# Patient Record
Sex: Female | Born: 1985 | Race: Black or African American | Hispanic: No | Marital: Married | Smoking: Never smoker
Health system: Southern US, Community
[De-identification: ages and names within clinical notes are randomized; demographics above are authoritative.]

## PROBLEM LIST (undated history)

## (undated) ENCOUNTER — Inpatient Hospital Stay (HOSPITAL_COMMUNITY): Payer: Self-pay

## (undated) DIAGNOSIS — T8859XA Other complications of anesthesia, initial encounter: Secondary | ICD-10-CM

## (undated) DIAGNOSIS — R87619 Unspecified abnormal cytological findings in specimens from cervix uteri: Secondary | ICD-10-CM

## (undated) DIAGNOSIS — G43909 Migraine, unspecified, not intractable, without status migrainosus: Secondary | ICD-10-CM

## (undated) DIAGNOSIS — Z9889 Other specified postprocedural states: Secondary | ICD-10-CM

## (undated) DIAGNOSIS — D649 Anemia, unspecified: Secondary | ICD-10-CM

## (undated) DIAGNOSIS — K219 Gastro-esophageal reflux disease without esophagitis: Secondary | ICD-10-CM

## (undated) DIAGNOSIS — T7840XA Allergy, unspecified, initial encounter: Secondary | ICD-10-CM

## (undated) DIAGNOSIS — R32 Unspecified urinary incontinence: Secondary | ICD-10-CM

## (undated) DIAGNOSIS — IMO0002 Reserved for concepts with insufficient information to code with codable children: Secondary | ICD-10-CM

## (undated) DIAGNOSIS — Z8619 Personal history of other infectious and parasitic diseases: Secondary | ICD-10-CM

## (undated) DIAGNOSIS — B999 Unspecified infectious disease: Secondary | ICD-10-CM

## (undated) DIAGNOSIS — A6000 Herpesviral infection of urogenital system, unspecified: Secondary | ICD-10-CM

## (undated) DIAGNOSIS — N83209 Unspecified ovarian cyst, unspecified side: Secondary | ICD-10-CM

## (undated) DIAGNOSIS — T4145XA Adverse effect of unspecified anesthetic, initial encounter: Secondary | ICD-10-CM

## (undated) DIAGNOSIS — I10 Essential (primary) hypertension: Secondary | ICD-10-CM

## (undated) DIAGNOSIS — R112 Nausea with vomiting, unspecified: Secondary | ICD-10-CM

## (undated) HISTORY — DX: Other complications of anesthesia, initial encounter: T88.59XA

## (undated) HISTORY — DX: Allergy, unspecified, initial encounter: T78.40XA

## (undated) HISTORY — DX: Essential (primary) hypertension: I10

## (undated) HISTORY — DX: Unspecified infectious disease: B99.9

## (undated) HISTORY — PX: COLPOSCOPY: SHX161

## (undated) HISTORY — DX: Adverse effect of unspecified anesthetic, initial encounter: T41.45XA

## (undated) HISTORY — PX: CERVIX LESION DESTRUCTION: SHX591

## (undated) HISTORY — DX: Personal history of other infectious and parasitic diseases: Z86.19

## (undated) HISTORY — DX: Migraine, unspecified, not intractable, without status migrainosus: G43.909

## (undated) HISTORY — PX: WISDOM TOOTH EXTRACTION: SHX21

## (undated) HISTORY — DX: Gastro-esophageal reflux disease without esophagitis: K21.9

## (undated) HISTORY — DX: Herpesviral infection of urogenital system, unspecified: A60.00

## (undated) HISTORY — DX: Unspecified urinary incontinence: R32

---
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
## 2004-01-08 ENCOUNTER — Encounter: Admission: RE | Admit: 2004-01-08 | Discharge: 2004-01-08 | Payer: Self-pay | Admitting: *Deleted

## 2004-01-23 ENCOUNTER — Ambulatory Visit (HOSPITAL_COMMUNITY): Admission: RE | Admit: 2004-01-23 | Discharge: 2004-01-23 | Payer: Self-pay | Admitting: *Deleted

## 2004-01-31 ENCOUNTER — Inpatient Hospital Stay (HOSPITAL_COMMUNITY): Admission: AD | Admit: 2004-01-31 | Discharge: 2004-01-31 | Payer: Self-pay | Admitting: Family Medicine

## 2004-02-04 ENCOUNTER — Inpatient Hospital Stay (HOSPITAL_COMMUNITY): Admission: AD | Admit: 2004-02-04 | Discharge: 2004-02-08 | Payer: Self-pay | Admitting: Obstetrics & Gynecology

## 2004-02-04 ENCOUNTER — Ambulatory Visit: Payer: Self-pay | Admitting: Obstetrics & Gynecology

## 2004-02-05 ENCOUNTER — Encounter (INDEPENDENT_AMBULATORY_CARE_PROVIDER_SITE_OTHER): Payer: Self-pay | Admitting: *Deleted

## 2004-08-11 IMAGING — US US OB COMP +14 WK
1 series · 13 of 28 positions shown · non-contrast
Comparison: none

CLINICAL DATA: Anatomic exam.  No current problems.

[Series 1: unknown · 0.28mm/px · 13 of 101 slices shown]
[im 4/101]
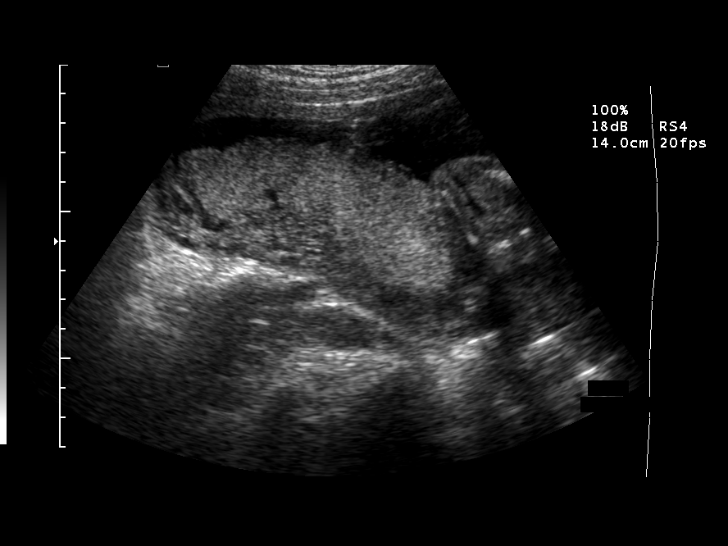
[im 12/101]
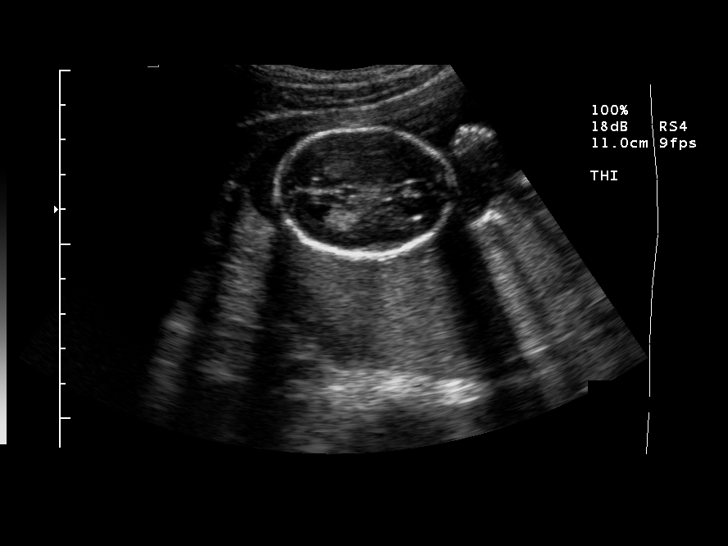
[im 19/101]
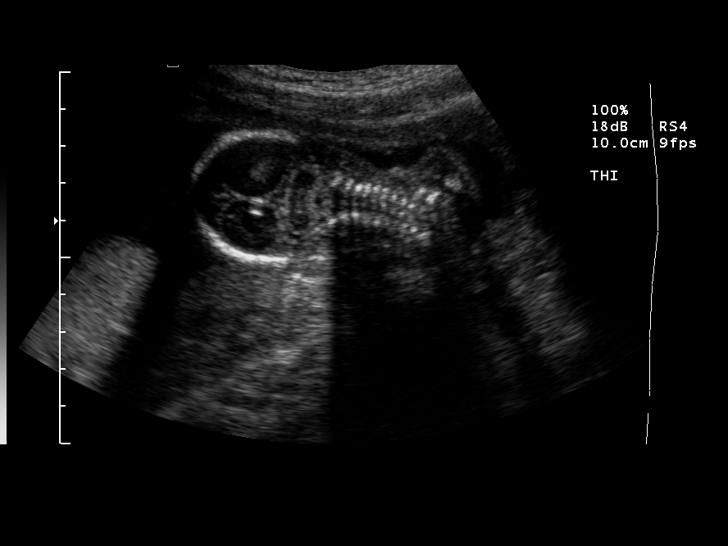
[im 26/101]
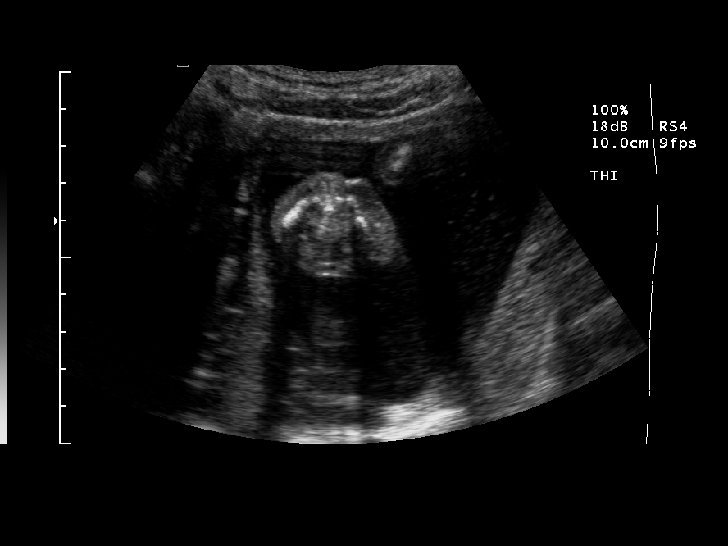
[im 34/101]
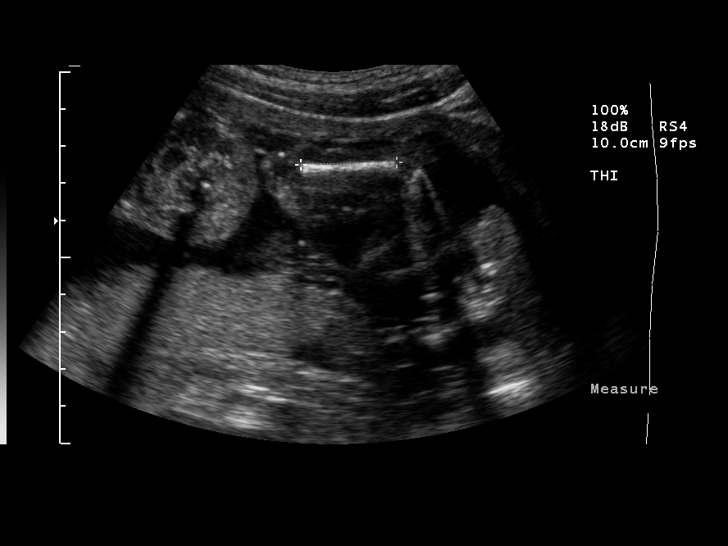
[im 41/101]
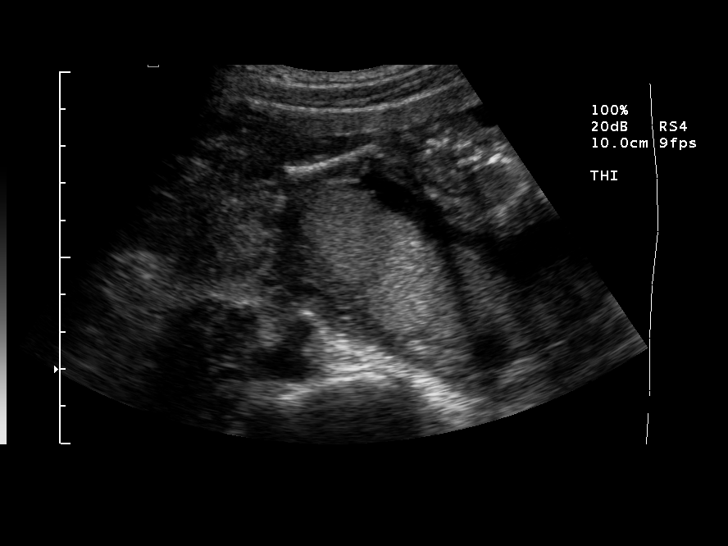
[im 52/101]
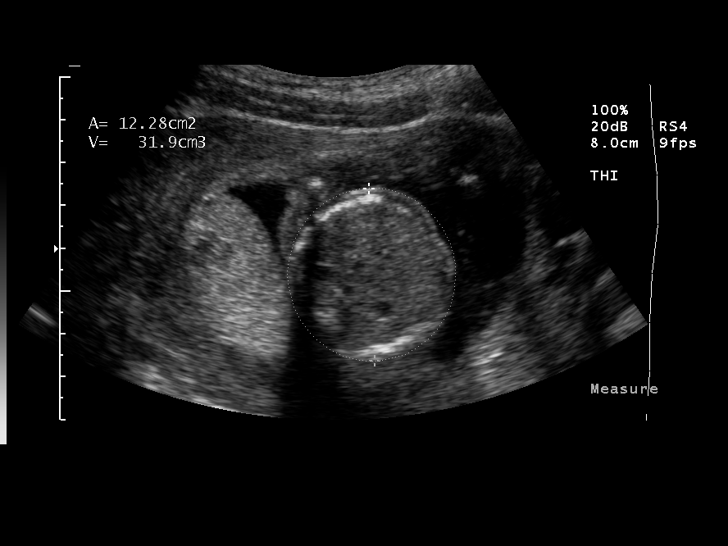
[im 60/101]
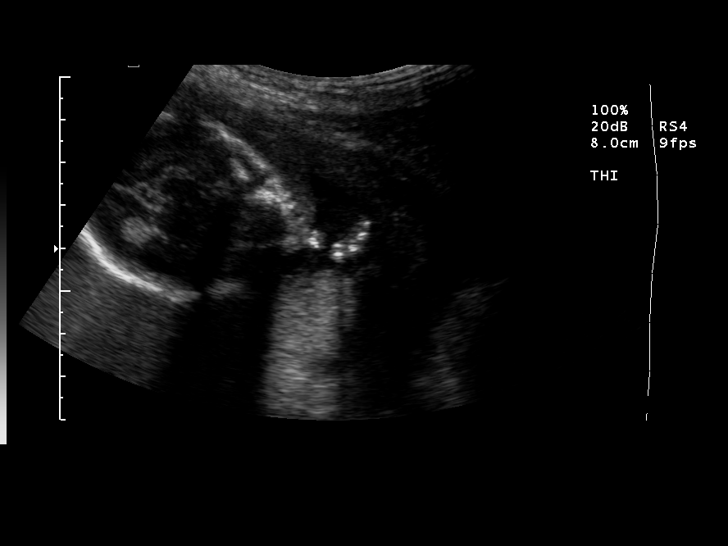
[im 67/101]
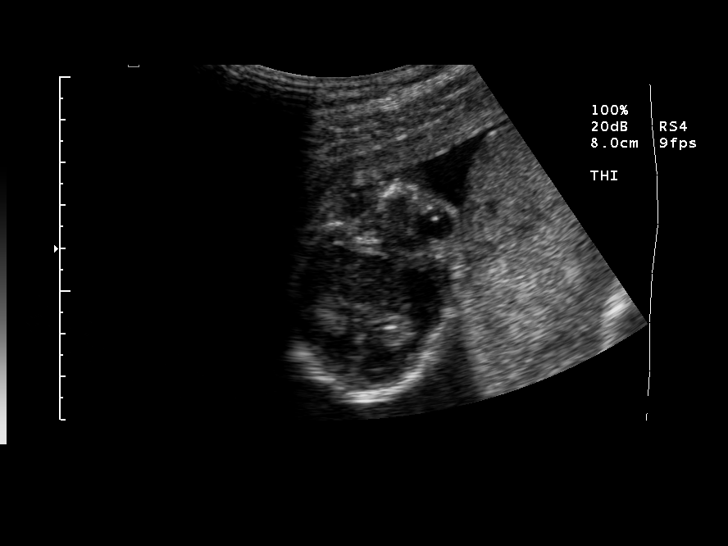
[im 75/101]
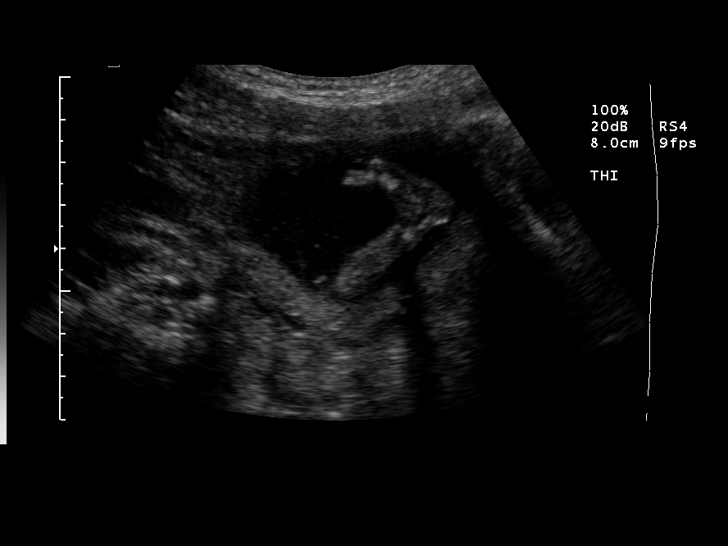
[im 82/101]
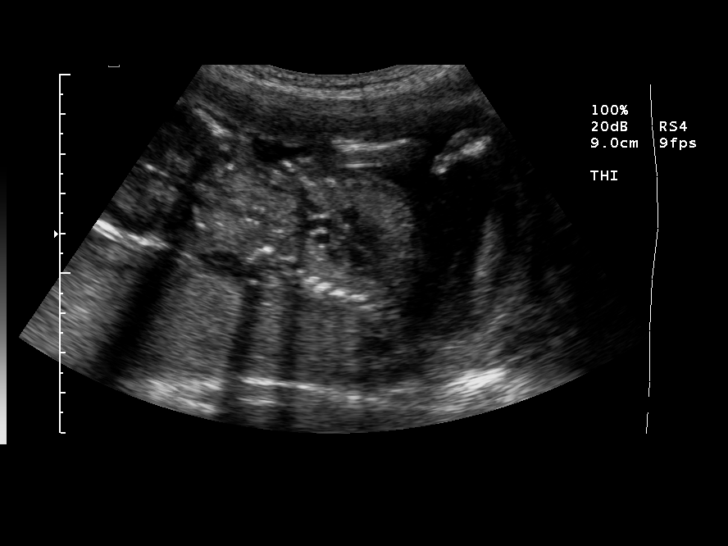
[im 89/101]
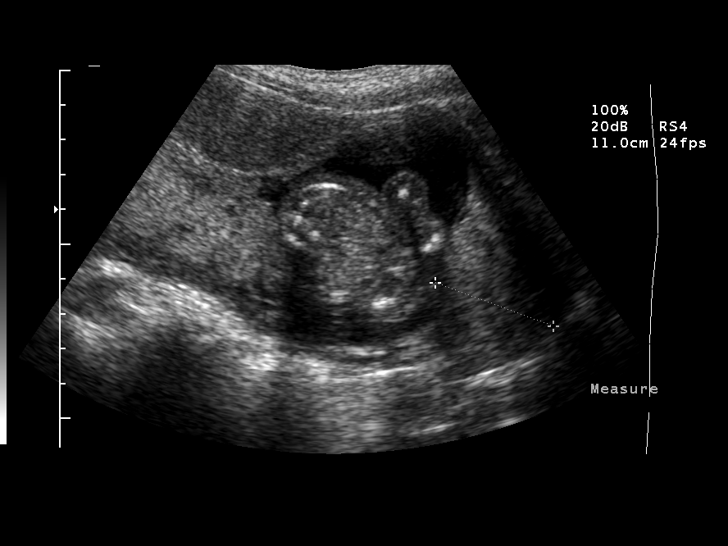
[im 97/101]
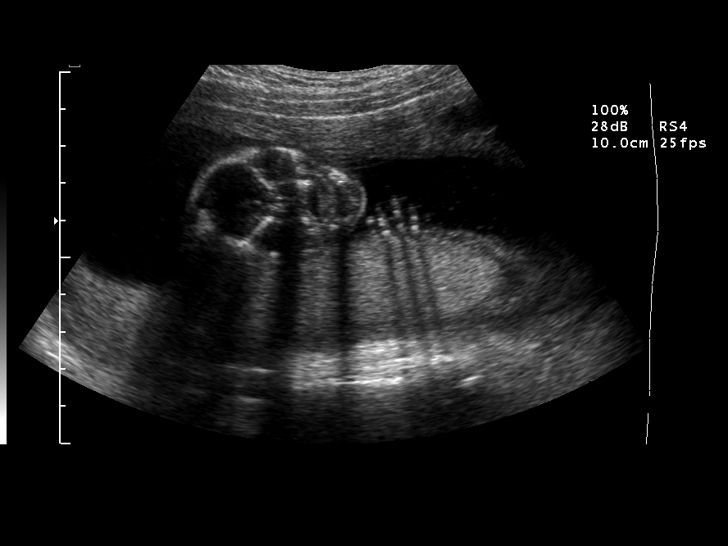

[13 of 28 positions shown; findings below may reference images not displayed]

OBSTETRICAL ULTRASOUND
Number of Fetuses:  1
Heart Rate:  162
Movement:  Yes
Breathing:    Yes  
Presentation:  Breech
Placental Location:  Posterior
Grade:  I
Previa:  No
Amniotic Fluid (Subjective):  Normal
Amniotic Fluid (Objective):   6.5 cm Vertical pocket 

FETAL BIOMETRY
BPD:  3.8 cm   17 w 4 d
HC:  14.6 cm   17 w 6 d
AC:  12.6 cm   18 w 1 d
FL:  2.8 cm   18 w 4 d

MEAN GA:  18 w 1 d

FETAL ANATOMY
Lateral Ventricles:    Visualized 
Thalami/CSP:      Visualized 
Posterior Fossa:  Visualized 
Nuchal Region:    Visualized 
Spine:      Visualized 
4 Chamber Heart on Left:      Visualized 
Stomach on Left:      Visualized 
3 Vessel Cord:    Visualized 
Cord Insertion site:    Visualized 
Kidneys:  Visualized 
Bladder:  Visualized 
Extremities:      Visualized 

ADDITIONAL ANATOMY VISUALIZED:  LVOT, RVOT, upper lip, orbits, profile, diaphragm, heel, 5th digit, ductal arch, aortic arch, and male genitalia

MATERNAL FINDINGS
Cervix:   3.6 cm Transabdominally
IMPRESSION: Single intrauterine pregnancy demonstrating an estimated gestational age by ultrasound of 18 weeks and 1 day.  Correlation with assigned gestational age by initial ultrasound of 18 weeks suggests appropriate growth.
No focal fetal or placental abnormalities are noted with a good anatomic exam possible.  

</u12:p>

## 2004-08-19 ENCOUNTER — Emergency Department (HOSPITAL_COMMUNITY): Admission: EM | Admit: 2004-08-19 | Discharge: 2004-08-19 | Payer: Self-pay | Admitting: Family Medicine

## 2005-05-11 DIAGNOSIS — B999 Unspecified infectious disease: Secondary | ICD-10-CM

## 2005-05-11 HISTORY — DX: Unspecified infectious disease: B99.9

## 2005-09-23 ENCOUNTER — Emergency Department (HOSPITAL_COMMUNITY): Admission: EM | Admit: 2005-09-23 | Discharge: 2005-09-23 | Payer: Self-pay | Admitting: Family Medicine

## 2006-07-05 ENCOUNTER — Emergency Department (HOSPITAL_COMMUNITY): Admission: EM | Admit: 2006-07-05 | Discharge: 2006-07-05 | Payer: Self-pay | Admitting: Emergency Medicine

## 2007-03-17 ENCOUNTER — Inpatient Hospital Stay (HOSPITAL_COMMUNITY): Admission: AD | Admit: 2007-03-17 | Discharge: 2007-03-17 | Payer: Self-pay | Admitting: Gynecology

## 2007-04-21 ENCOUNTER — Encounter: Payer: Self-pay | Admitting: Obstetrics & Gynecology

## 2007-04-21 ENCOUNTER — Ambulatory Visit: Payer: Self-pay | Admitting: Obstetrics & Gynecology

## 2007-04-25 ENCOUNTER — Ambulatory Visit (HOSPITAL_COMMUNITY): Admission: RE | Admit: 2007-04-25 | Discharge: 2007-04-25 | Payer: Self-pay | Admitting: Obstetrics & Gynecology

## 2007-05-19 ENCOUNTER — Ambulatory Visit: Payer: Self-pay | Admitting: Gynecology

## 2007-06-08 ENCOUNTER — Ambulatory Visit: Payer: Self-pay | Admitting: Obstetrics & Gynecology

## 2008-01-10 ENCOUNTER — Inpatient Hospital Stay (HOSPITAL_COMMUNITY): Admission: AD | Admit: 2008-01-10 | Discharge: 2008-01-10 | Payer: Self-pay | Admitting: Obstetrics and Gynecology

## 2008-03-05 ENCOUNTER — Inpatient Hospital Stay (HOSPITAL_COMMUNITY): Admission: AD | Admit: 2008-03-05 | Discharge: 2008-03-05 | Payer: Self-pay | Admitting: Obstetrics and Gynecology

## 2008-05-24 ENCOUNTER — Encounter (INDEPENDENT_AMBULATORY_CARE_PROVIDER_SITE_OTHER): Payer: Self-pay | Admitting: Obstetrics and Gynecology

## 2008-05-24 ENCOUNTER — Ambulatory Visit: Payer: Self-pay | Admitting: Surgery

## 2008-05-24 ENCOUNTER — Ambulatory Visit: Admission: RE | Admit: 2008-05-24 | Discharge: 2008-05-24 | Payer: Self-pay | Admitting: Obstetrics and Gynecology

## 2008-06-16 ENCOUNTER — Inpatient Hospital Stay (HOSPITAL_COMMUNITY): Admission: AD | Admit: 2008-06-16 | Discharge: 2008-06-16 | Payer: Self-pay | Admitting: Obstetrics and Gynecology

## 2008-07-10 ENCOUNTER — Inpatient Hospital Stay (HOSPITAL_COMMUNITY): Admission: AD | Admit: 2008-07-10 | Discharge: 2008-07-10 | Payer: Self-pay | Admitting: Obstetrics and Gynecology

## 2008-07-13 ENCOUNTER — Inpatient Hospital Stay (HOSPITAL_COMMUNITY): Admission: AD | Admit: 2008-07-13 | Discharge: 2008-07-13 | Payer: Self-pay | Admitting: Obstetrics and Gynecology

## 2008-08-07 ENCOUNTER — Inpatient Hospital Stay (HOSPITAL_COMMUNITY): Admission: RE | Admit: 2008-08-07 | Discharge: 2008-08-10 | Payer: Self-pay | Admitting: Obstetrics and Gynecology

## 2008-08-07 ENCOUNTER — Encounter (INDEPENDENT_AMBULATORY_CARE_PROVIDER_SITE_OTHER): Payer: Self-pay | Admitting: Obstetrics and Gynecology

## 2008-08-14 ENCOUNTER — Ambulatory Visit: Admission: RE | Admit: 2008-08-14 | Discharge: 2008-08-14 | Payer: Self-pay | Admitting: Obstetrics and Gynecology

## 2010-06-01 ENCOUNTER — Encounter: Payer: Self-pay | Admitting: *Deleted

## 2010-08-21 LAB — CBC
HCT: 27.4 % — ABNORMAL LOW (ref 36.0–46.0)
HCT: 32.8 % — ABNORMAL LOW (ref 36.0–46.0)
Hemoglobin: 11.1 g/dL — ABNORMAL LOW (ref 12.0–15.0)
Hemoglobin: 9.3 g/dL — ABNORMAL LOW (ref 12.0–15.0)
MCHC: 33.7 g/dL (ref 30.0–36.0)
MCHC: 34 g/dL (ref 30.0–36.0)
MCHC: 33.6 g/dL (ref 30.0–36.0)
MCV: 91.3 fL (ref 78.0–100.0)
Platelets: 211 10*3/uL (ref 150–400)
RBC: 3.01 MIL/uL — ABNORMAL LOW (ref 3.87–5.11)
RBC: 3.61 MIL/uL — ABNORMAL LOW (ref 3.87–5.11)
RDW: 14.7 % (ref 11.5–15.5)
RDW: 13.6 % (ref 11.5–15.5)
WBC: 6.8 10*3/uL (ref 4.0–10.5)
WBC: 9.3 10*3/uL (ref 4.0–10.5)
WBC: 10.2 10*3/uL (ref 4.0–10.5)

## 2010-08-21 LAB — STREP B DNA PROBE

## 2010-08-21 LAB — URINALYSIS, ROUTINE W REFLEX MICROSCOPIC
Bilirubin Urine: NEGATIVE
Glucose, UA: NEGATIVE mg/dL
Ketones, ur: NEGATIVE mg/dL
Nitrite: NEGATIVE
Protein, ur: NEGATIVE mg/dL
Specific Gravity, Urine: <1.005 — ABNORMAL LOW (ref 1.005–1.030)

## 2010-08-21 LAB — DIFFERENTIAL
Basophils Relative: 0 % (ref 0–1)
Eosinophils Absolute: 0.1 10*3/uL (ref 0.0–0.7)
Eosinophils Relative: 1 % (ref 0–5)
Lymphocytes Relative: 28 % (ref 12–46)
Monocytes Absolute: 0.9 10*3/uL (ref 0.1–1.0)
Neutro Abs: 6.4 10*3/uL (ref 1.7–7.7)

## 2010-08-21 LAB — WET PREP, GENITAL: Trich, Wet Prep: NONE SEEN

## 2010-08-21 LAB — GC/CHLAMYDIA PROBE AMP, GENITAL: GC Probe Amp, Genital: NEGATIVE

## 2010-08-26 LAB — COMPREHENSIVE METABOLIC PANEL
AST: 19 U/L (ref 0–37)
CO2: 25 mEq/L (ref 19–32)
Chloride: 108 mEq/L (ref 96–112)
Creatinine, Ser: 0.49 mg/dL (ref 0.4–1.2)
GFR calc Af Amer: >60 mL/min (ref >60)
GFR calc non Af Amer: >60 mL/min (ref >60)
Glucose, Bld: 81 mg/dL (ref 70–99)

## 2010-08-26 LAB — DIFFERENTIAL
Basophils Absolute: 0 10*3/uL (ref 0.0–0.1)
Basophils Relative: 0 % (ref 0–1)
Eosinophils Relative: 1 % (ref 0–5)
Lymphocytes Relative: 26 % (ref 12–46)
Lymphs Abs: 2.8 10*3/uL (ref 0.7–4.0)
Monocytes Absolute: 1 10*3/uL (ref 0.1–1.0)
Neutro Abs: 6.6 10*3/uL (ref 1.7–7.7)

## 2010-08-26 LAB — CBC
MCHC: 34.4 g/dL (ref 30.0–36.0)
MCV: 91.7 fL (ref 78.0–100.0)
Platelets: 237 10*3/uL (ref 150–400)
RDW: 13.2 % (ref 11.5–15.5)

## 2010-08-26 LAB — URINALYSIS, ROUTINE W REFLEX MICROSCOPIC
Bilirubin Urine: NEGATIVE
Glucose, UA: NEGATIVE mg/dL
Ketones, ur: NEGATIVE mg/dL
Nitrite: NEGATIVE
Specific Gravity, Urine: <1.005 — ABNORMAL LOW (ref 1.005–1.030)
Urobilinogen, UA: 0.2 mg/dL (ref 0.0–1.0)
pH: 6.5 (ref 5.0–8.0)

## 2010-09-23 NOTE — H&P (Signed)
NAMEMELANE, [name]              ACCOUNT NO.:  [id number]   MEDICAL RECORD NO.:  [id number]           PATIENT TYPE:   LOCATION:                                 FACILITY:   PHYSICIAN:  Vicki L. Latham, C.N.M.DATE OF BIRTH:  Jan 24, 1986   DATE OF ADMISSION:  08/07/2008  DATE OF DISCHARGE:                              HISTORY & PHYSICAL   [name] is a 25 year old, gravida 2, para 0-1-0-1, at 39 weeks, who  presents for scheduled repeat cesarean section.  The patient's pregnancy  has been remarkable for:   1. Previous preterm labor and preterm delivery at 35 weeks, due to      breech presentation.  2. Previous cesarean section with desire for repeat.  3. History of hyperemesis.  4. Anemia.  5. Positive group B strep.  6. History of abnormal Pap.  7. Amoxicillin allergy.   PRENATAL LABS:  Blood type is B+, Rh antibody negative, VDRL  nonreactive, rubella titer positive, hepatitis B surface antigen  negative, HIV is nonreactive.  Sickle cell test was negative in previous  pregnancy.  Pap was normal in January 2009.  GC and Chlamydia cultures  were negative in the first trimester.  Cystic fibrosis testing was  negative in previous pregnancy.  The patient had a first trimester  screen that was normal.  She had an AFP that was normal.  Glucola was  normal at 119.  Hemoglobin at 28 weeks was 10.  Group B strep culture  was positive at 36 weeks.  GC and Chlamydia cultures were negative.   HISTORY OF PRESENT PREGNANCY:  The patient entered care at approximately  10 weeks.  She had some nausea and vomiting in the early pregnancy and  was seen at maternity admissions for IV fluids and was on Phenergan.  She planned a repeat C-section from the beginning of her pregnancy.  She  also declined 17P.  She was placed on iron supplement after her first  visit, secondary to hemoglobin 11.7.  She had a normal first-trimester  screen.  AFP was normal.  She had another visit at [facility] at  approximately  14-15 weeks and was given Zofran at that time.  She had an ultrasound at  18 weeks, showing normal growth and development and normal cervical  length.  Glucola was given at 26 weeks and was normal and she continued  on Zofran use sporadically through most of her pregnancy.  At 28 weeks,  she had some vaginal discharge and was treated with Terazol and  Lotrisone.  She had some pain behind the right knee.  Dopplers were done  that were normal.  Fetal fibronectin was done at 30 weeks secondary to  cramping.  This was negative.  GC and Chlamydia cultures were done at 32  weeks, secondary to cramping.  These were negative.  She had positive  group B strep culture done at 36 weeks.  Her cervix at 37 weeks was  fingertip and 50%.  She was consented for repeat cesarean section and  this was scheduled for August 07, 2008.   OBSTETRICAL HISTORY:  In September  2005 she had a primary low transverse  cesarean section for a female infant, weight 5 pounds 5 ounces at 35  weeks.  She was in labor 6-7 hours.  This was done secondary to labor  and fetus being breech.  Through that pregnancy she did take  iron.  She  also had first-trimester spotting.  She also needed IV fluids  sporadically up until approximately 20-22 weeks.  She did have preterm  dilatation in that previous pregnancy and dilated 2.5 cm in the late  second and early third trimester.   MEDICAL HISTORY:  She is a previous condom birth control pill and  Implanon and Ortho-Evra patch user.  She had an abnormal Pap in 2002 and  had a colposcopy.  She had gonorrhea and Chlamydia diagnosed at age 42  and was treated.  She has some occasional yeast and bacterial  infections.  She reports usual childhood illnesses, does have a history  of anemia with her pregnancies.  She had a UTI as a baby, but none  since.   SURGICAL HISTORY:  Includes the previously-noted C-section in 2005.  She  has also required treatment of nausea and  vomiting after surgery.   ALLERGIES:  She is allergic to AMOXICILLIN, which causes hives.   FAMILY HISTORY:  Maternal grandmother had congestive heart failure.  Her  maternal grandmother had hypertension.  Her mother has varicosities.  Her father and maternal first cousin have type 2 diabetes.  Her mother  is hyperthyroid.  Maternal grandmother was on dialysis.  Her mother,  maternal first cousin and maternal uncle have sarcoidosis.  Maternal  grandfather had prostate cancer.  Genetic history is unremarkable.   SOCIAL HISTORY:  The patient is single.  Father of baby is involved and  supportive.  His name is England Greb.  The patient is Patent examiner.  She denies a religious affiliation.  She is high-school-  educated.  She is employed at [employer].  Her partner is also high-  school-educated.  He is a Investment banker, operational.  She has been followed by the [facility]  She denies any alcohol, drug or tobacco  use during this pregnancy.   PHYSICAL EXAM:  VITAL SIGNS: Stable.  The patient is afebrile.  HEENT: Within normal limits.  LUNGS:  Breath sounds are clear.  HEART:  Regular rate and rhythm without murmur.  BREASTS:  Soft and nontender.  ABDOMEN:  Fundal height is appropriate for gestational age at  approximately 39 weeks.  Estimated fetal weight 7 to 7-1/2 pounds.  Uterine contractions are very occasional per patient report.  Cervical  exam on last visit on March 25 was long and closed by Dr. Pennie Rushing.  Fetal heart rate is in the 150s by Doppler.  The abdomen shows a well-  healed low transverse cesarean section incision from 2005.  EXTREMITIES:  Deep tendon reflexes are 2+ without clonus.  There is  trace edema noted.   IMPRESSION:  1. Intrauterine pregnancy at 39 weeks.  2. Previous cesarean section with desire for repeat.  3. Positive group B strep.  4. History of hyperemesis this pregnancy with patient continuing on      Zofran sporadically during  her pregnancy.   PLAN:  1. Admit to [facility] per consult with Dr. Dierdre Forth as attending physician.  2. Routine physician preoperative orders.  3. Antibiotic treatment will be appropriately ordered by Dr. Pennie Rushing.  Renaldo Reel Emilee Hero, C.N.M.     VLL/MEDQ  D:  08/03/2008  T:  08/03/2008  Job:  [id number]

## 2010-09-23 NOTE — H&P (Signed)
NAMEAVALEIGH, DECUIR NO.:  [id number]   MEDICAL RECORD NO.:  [id number]          PATIENT TYPE:  WOC   LOCATION:  WOC                          FACILITY:  [facility]   PHYSICIAN:  Allie Bossier, MD        DATE OF BIRTH:  10-Dec-1985   DATE OF ADMISSION:  04/21/2007  DATE OF DISCHARGE:                              HISTORY & PHYSICAL   OFFICE VISIT:  Alencia is a 25 year old single black gravida 1, para 47  with a 92-year-old son who comes in for her annual exam.  She also needs  a followup on an ultrasound finding of an ovarian cyst on March 15, 2007.  Today she has no complaints.  She does not want birth control  other than the condoms that she currently uses.   PAST MEDICAL HISTORY:  None.   PAST SURGICAL HISTORY:  Cesarean section.   REVIEW OF SYSTEMS:  Noncontributory.   ALLERGIES:  No latex allergies.  Medication allergies are AMOXICILLIN.   SOCIAL HISTORY:  Positive only for social alcohol.   FAMILY HISTORY:  Negative for breast, gyn or colon malignancies.   MEDICATIONS:  Multivitamins daily.   PHYSICAL EXAMINATION:  VITAL SIGNS:  Weight 140 pounds, height 5 feet 3  inches, blood pressure 115/69 and pulse 70.  HEENT:  Normal.  BREASTS:  Exam normal bilaterally.  HEART:  Regular rate and rhythm.  LUNGS:  Clear to auscultation bilaterally.  ABDOMEN:  Benign.  External genitalia no lesions.  Vagina normal  discharge.  Cervix nulliparous.  Bimanual exam of normal size and shape,  anteverted mobile uterus and nonenlarged adnexa.   ASSESSMENT AND PLAN:  1. Annual exam.  I have checked a Pap smear and sent cervical cultures      with that.  2. We have discussed self-breast and self-vulvar exams monthly.  3. Will follow up on her left adnexal mass with a repeat ultrasound.      Allie Bossier, MD  Electronically Signed    MCD/MEDQ  D:  04/21/2007  T:  04/22/2007  Job:  [id number]

## 2010-09-23 NOTE — Discharge Summary (Signed)
NAMECHRISTIANA, [name]              ACCOUNT NO.:  [id number]   MEDICAL RECORD NO.:  [id number]          PATIENT TYPE:  INP   LOCATION:  9118                          [facility]ILITY[facility]   PHYSICIAN:  Osborn Coho, M.D.   DATE OF BIRTH:  1985/08/30   DATE OF ADMISSION:  08/07/2008  DATE OF DISCHARGE:  08/10/2008                               DISCHARGE SUMMARY   [name] is a 25 year old gravida 2, para 1-1-0-2, who was admitted  for a repeat cesarean for the delivery of her second son Angela [name],  who was delivered at 49 weeks' gestation, and weighed 8 pounds 4 ounces  with Apgars of 8 at 1-minute and 9 at 5 minutes.   ADMISSION DIAGNOSES:  1. Term singleton Intrauterine Pregnancy.  2. Previous cesarean at 35 weeks for breech.  3. Positive group B streptococcus.  4. history of abnormal Pap smear/colposcopy.  5. History of gonorrhea and chlamydia.  6. AMOXICILLIN allergy.   DISCHARGE DIAGNOSES:  1. Stable 3 days status post term repeat cesarean.  2. Positive group B streptococcus.  3. History of abnormal Pap smear/colposcopy.  4. Remote history of chlamydia and gonorrhea.  5. Allergic to AMOXICILLIN.  6. Anemia.   PERTINENT LABORATORY DATA:  B positive, rubella immune, and positive  GBS.  WBC 9.3(6.8), HGB 9.3 (11.1), HCT 27.4 (32.8), PLT 211 (256).   COURSE OF STAY:  [name] was admitted on the morning of August 07, 2008 for planned repeat cesarean section, which she was conducted  without complications under spinal anesthesia.  She had an excellent  course of recovery with good mobility and pain control and progressed  well under the cesarean postoperative clinical pathway care.  On August 10, 2008, she was discharged home in stable condition.  Physical exam and  vital signs on the day of discharge; vital signs were 98.1, 71, 18,  112/67.  [name] reported good pain relief with her medication.  She  had positive flatus and was seen to be up and about the room moving  very  well with good posture.  She was voiding without difficulty and needed  to be discharged home.  She was alert and oriented x3 with lungs clear  to auscultation.  Heart, regular rate and rhythm without murmur.  No  edema of extremities was found.  DTRs +1/+1.  Negative Homans x2.  Breasts were soft.  Nipples were sore, but intact.  She was wearing  shelf in her nursing.  Her abdomen was soft without distention.  Her  incision and Steri-Strips were clean, dry, and intact without any  gaping.  She had very scant lochia from her vagina and was working with  the Advertising copywriter on breast feeding and pumping issues.  She was  provided with the [facility] for woman postpartum instruction  booklet, which was reviewed with her specifically danger signs of the  postpartum and postoperative.   DISCHARGE MEDICATIONS:  1. Micronor.  2. Motrin 600 mg.  3. Percocet.  4. She is to continue Flintstones.  5. Iron supplementation was discussed with [name] and  she declined      iron supplementation and she is strongly opposed to taking due to      side effects.  However, she had adequate energy and was      experiencing no dizziness or orthostatic symptoms with her      hemoglobin of 9.3.  [name] was deemed to have received full      benefit for the hospitalization as delivery of her son, Angela [name].      Eulogio Bear, CNM      Osborn Coho, M.D.  Electronically Signed    JM/MEDQ  D:  08/10/2008  T:  08/11/2008  Job:  [id number]

## 2010-09-23 NOTE — Op Note (Signed)
Angela [name], Angela [name]              ACCOUNT NO.:  [id number]   MEDICAL RECORD NO.:  [id number]          PATIENT TYPE:  MAT   LOCATION:  MATC                          [facility]  [facility]   PHYSICIAN:  Osborn Coho, M.D.   DATE OF BIRTH:  Sep 19, 1985   DATE OF PROCEDURE:  08/07/2008  DATE OF DISCHARGE:  07/13/2008                               OPERATIVE REPORT   PREOPERATIVE DIAGNOSES:  1. 39 weeks.  2. Repeat cesarean section.   POSTOPERATIVE DIAGNOSES:  1. 39 weeks.  2. Repeat cesarean section.  3. Thick meconium.   PROCEDURE:  Repeat cesarean section.   ATTENDING DOCTOR:  Osborn Coho, MD   ASSISTANT:  Renaldo Reel. Emilee Hero, CNM   ANESTHESIA:  Spinal.   FINDINGS:  Normal appearing bilateral ovaries and fallopian tubes, live  female infant with Apgars of 8 at one minute and 9 at five minutes [employer] weighing 8 pounds 4 ounces.  Placenta sent to pathology.   FLUIDS:  1700 mL.   URINE OUTPUT:  200 mL.   EBL:  500 mL.   COMPLICATIONS:  None.   PROCEDURE:  The patient was taken to the operating room after the risks,  benefits, and alternatives discussed with the patient.  The patient  verbalized understanding and consent signed and witnessed.  The patient  was given a spinal per anesthesia and prepped and draped in the normal  sterile fashion in the supine position.  A Pfannenstiel skin incision  was made at the site of the prior scar and carried down to the  underlying layer of fascia with the scalpel.  The fascia was excised  bilaterally in the midline and extended bilaterally with the Mayo  scissors.  Kocher clamps were placed on the inferior aspect of the  fascial incision and the rectus muscle excised from the fascia.  The  same was done on the superior aspect of the fascial incision.  There was  dense scar tissue on the upper portion of the fascial incision where the  omentum was essentially adherent to the anterior abdominal wall.  These  adhesions were taken down  sharply.  There was no evidence of any bowel  in the area.  The muscle was separated in the midline and at the  peritoneum was already entered, was extended manually.  The bladder  blade was placed and bladder flap created with the Metzenbaum scissors.  The uterine incision was made with a scalpel and extended bilaterally  with the bandage scissors.  The membranes were ruptured with an Allis  and thick meconium-stained fluid was noted.  The infant was delivered in  the vertex presentation and cord clamped and cut and infant handed to  the awaiting pediatricians.  The placenta was removed via fundal massage  and the uterus cleared of all clots and debris.  The uterine incision  was repaired with 0 Vicryl, via a running interlocking stitch and a  second imbricating layer was performed.  Normal bilateral ovaries and  fallopian tubes were noted.  The intra-abdominal cavity was copiously  irrigated and the uterine incision was noted be hemostatic.  The  peritoneum was repaired with 2-0 chromic in a running fashion and the  fascia was repaired with 0 Vicryl in a running fashion.  The  subcutaneous tissue was reapproximated using 3 interrupted stitches of 2-  0 plain and the skin was reapproximated using 3-0 Monocryl via a  subcuticular stitch.  Half-inch Steri-Strips were applied with Benzoin  and a pressure dressing applied as well.  Sponge, lap, and needle count  was correct.  The patient tolerated the procedure well and is currently  awaiting transfer to the recovery room in good condition.      Osborn Coho, M.D.  Electronically Signed     AR/MEDQ  D:  08/07/2008  T:  08/08/2008  Job:  [id number]

## 2010-09-23 NOTE — Group Therapy Note (Signed)
[name], [name] NO.:  [id number]   MEDICAL RECORD NO.:  [id number]          PATIENT TYPE:  WOC   LOCATION:  [facility]                   FACILITY:  [facility]   PHYSICIAN:  Ginger Carne, MD DATE OF BIRTH:  [birth year]   DATE OF SERVICE:  05/19/2007                                  CLINIC NOTE   HISTORY OF PRESENT ILLNESS:  This is a 25 year old female who is here  for followup of an ultrasound that was performed on December 15th.  The  patient was diagnosed back in November with a ruptured hemorrhagic cyst  via ultrasound and came back on December 11th for followup.  At that  time, she said she was doing well with no complaints and they wanted to  repeat the ultrasound to reassure that there was nothing of concern.  The ultrasound from December 15th shows normal pelvic ultrasound with  interval resolution of left ovarian stromal hemorrhage and  intraperitoneal hemorrhage.  Therefore, this hemorrhagic cyst has  resolved.  The patient is no longer having pain.  She only complains of  some occasional foul vaginal odor over the past month and nothing else  of concern.  She is on her menstrual cycle currently.   MEDICAL HISTORY:  None.   SURGICAL HISTORY:  C-section.   ALLERGIES:  AMOXICILLIN.   MEDS:  Vitamins.   PHYSICAL EXAMINATION:  VITAL SIGNS:  Afebrile 98.8, pulse is 74,  respiratory rate 24, blood pressure is 121/71, weight is 138.6.  GENERAL EXAM:  Not in acute distress.   ASSESSMENT AND PLAN:  Reviewed the ultrasound imaging with the patient  and discussed with her that she had a hemorrhagic follicular cyst which  has resolved.  She may indeed have further cysts in the future.  The  patient voices understanding of this. Understands that it is not an  ovarian cancer.  As for the foul smelling odor, the patient states that  she has already been checked out for yeast, gonorrhea, and chlamydia and  is on a period now and defers any other exam.  She  will return to clinic  if the foul odor continues.  The patient will follow up p.r.n.     ______________________________  Angela Maine, MD    ______________________________  Ginger Carne, MD    JT/MEDQ  D:  05/19/2007  T:  05/19/2007  Job:  [id number]

## 2010-09-26 NOTE — Discharge Summary (Signed)
NAMEJAIRA, Angela [name]              ACCOUNT NO.:  [id number]   MEDICAL RECORD NO.:  [id number]          PATIENT TYPE:  INP   LOCATION:  9119                          [facility]   PHYSICIAN:  Lesly Dukes, M.D. DATE OF BIRTH:  Oct 23, 1985   DATE OF ADMISSION:  02/04/2004  DATE OF DISCHARGE:  02/08/2004                                 DISCHARGE SUMMARY   ADDENDUM:  The patient was not discharged on postoperative day #2 due to the  fact that her baby did not receive a discharge until postoperative day #3.  Her extra hospital day was uncomplicated and she is being discharged in  stable condition.      LC/MEDQ  D:  02/08/2004  T:  02/08/2004  Job:  [id number]

## 2010-09-26 NOTE — Op Note (Signed)
NAMEADELLYN, CAPEK              ACCOUNT NO.:  [id number]   MEDICAL RECORD NO.:  [id number]          PATIENT TYPE:  INP   LOCATION:  9119                          [facility]   PHYSICIAN:  Lesly Dukes, M.D. DATE OF BIRTH:  Oct 03, 1985   DATE OF PROCEDURE:  02/05/2004  DATE OF DISCHARGE:                                 OPERATIVE REPORT   PREOPERATIVE DIAGNOSES:  A 25 year old, G1, P0 at __________ weeks in labor  footling-breech.   POSTOPERATIVE DIAGNOSES:  A 25 year old, G1, P0 at __________ weeks in labor  footling-breech.   PROCEDURE:  Primary low flap transverse cesarean section.   SURGEON:  Lesly Dukes, M.D.   ASSISTANT:  Caren Griffins, C.N.M.   ANESTHESIA:  Spinal.   ESTIMATED BLOOD LOSS:  800 mL.   PATHOLOGY:  Placenta.   FINDINGS:  Viable female infant, Apgar's 6 at 1 minute and 9 at 5 minutes.  Arterial cord pH equal 7.20; clear fluid; footling breech presentation;  normal uterus, ovaries and fallopian tubes grossly.  Pediatric neonatologist  at delivery.   DESCRIPTION OF PROCEDURE:  After informed consent was obtained, the patient  was taken to the operating room where spinal anesthesia was found to be  adequate.  The patient was placed in dorsal supine position with a leftward  tilt and prepared and draped in the normal sterile fashion.  A Foley was in  the bladder. A Pfannenstiel skin incision was made with the scalpel and  carried down to the underlying layer of fascia, the fascia was incised in  the midline and entered bilaterally. The superior and inferior aspects of  the fascia incision were grasped with Kocher clamps, tinted up, and  dissected off sharply and bluntly from the underlying layers of rectus  muscle.  The rectus muscles were separated in the midline. The peritoneum  was identified, tented up and entered sharply with the Metzenbaum scissors.  The incision was extended both superiorly and inferiorly with good  visualization of the  bladder. The bladder blade was inserted. The  vesicouterine peritoneum was identified, tinted up and entered sharply with  Metzenbaum scissors. This incision was extended bilaterally, the bladder  flap was created digitally. The bladder blade was reinserted.  The uterine  incision was made in a transverse fashion, lower uterine segment with the  scalpel. The incision was extended bilaterally with the bandage scissors.  The amniotic sac was ruptured once presentation was confirmed.  The baby was  delivered by footling-breech without incident. The nose and mouth were  suctioned, the cord was clamped and cut and the baby was handed to the  waiting pediatrician.  Cord blood was sent for type and screen and cord gas  was also sent.  The placenta was delivered spontaneously with three vessel  cord. The uterus was cleared of all clots and debris. The uterine incision  was closed with #0 Vicryl in a running locked fashion. A second suture of #0  Vicryl was used to reinforce the incision and aid in hemostasis.  The  abdomen was irrigated and the uterine incision was found to be hemostatic.  The fascial incision was closed with #0 Vicryl in a  running locked fashion.  Good hemostasis was noted. The subcutaneous tissue  was then copiously irrigated and found to be hemostatic. The skin was closed  with staples. The patient tolerated the procedure well. Sponge, lap,  instrument and needle count were correct x2 and the patient went to the  recovery room in stable condition.      KHL/MEDQ  D:  02/05/2004  T:  02/05/2004  Job:  [id number]

## 2010-09-26 NOTE — Discharge Summary (Signed)
[name], [name]              ACCOUNT NO.:  [id number]   MEDICAL RECORD NO.:  [id number]          PATIENT TYPE:  INP   LOCATION:  9119                          FACILITY:  [facility]   PHYSICIAN:  Lesly Dukes, M.D. DATE OF BIRTH:  Aug 25, 1985   DATE OF ADMISSION:  02/04/2004  DATE OF DISCHARGE:  02/07/2004                                 DISCHARGE SUMMARY   ADMISSION DIAGNOSIS:  Intrauterine pregnancy at 35-3/7 weeks, complete  breech, early labor with reassuring fetal heart with intact membranes.   DISCHARGE DIAGNOSIS:  Intrauterine pregnancy at 35-3/7 weeks, complete  breech, early labor with reassuring fetal heart with intact membranes.   DISCHARGE MEDICATIONS:  1.  Ibuprofen.  2.  Percocet.  3.  Prenatal vitamins.  4.  Micronor.   HOSPITAL COURSE:  This is an 25 year old female who came in on February 04, 2004, at 35-3/7 weeks complaining of cramping.  She was having contractions  every 2-4 minutes and found to have a presentation of footling breech with  bulging bag.  She was admitted and taken for a cesarean section under spinal  anesthesia.  Estimated blood loss was 100 mL.  Findings were a viable female  infant with Apgars of 6 at one minute, 9 at five minutes with arterial cord  pH of 7.2.  Footling breech; normal uterus, ovaries and fallopian tubes.  The patient had normal postoperative hospital course.  Desiring to breast  feed and use Micronor for birth control.  The baby is to be circumcised.  The patient is discharged postoperative day #2.   DISCHARGE CONDITION:  Good.   DISCHARGE INSTRUCTIONS:  Followup with [facility] in six weeks.  Return  to the MAU for staple removal in five days.      TH/MEDQ  D:  02/07/2004  T:  02/07/2004  Job:  [id number]

## 2010-12-01 ENCOUNTER — Encounter (HOSPITAL_COMMUNITY): Payer: Self-pay | Admitting: *Deleted

## 2010-12-01 ENCOUNTER — Inpatient Hospital Stay (HOSPITAL_COMMUNITY): Payer: Self-pay

## 2010-12-01 ENCOUNTER — Inpatient Hospital Stay (HOSPITAL_COMMUNITY)
Admission: AD | Admit: 2010-12-01 | Discharge: 2010-12-01 | Disposition: A | Discharge status: Home / Self Care | Payer: PRIVATE HEALTH INSURANCE | Source: Ambulatory Visit | Attending: Obstetrics & Gynecology | Admitting: Obstetrics & Gynecology

## 2010-12-01 DIAGNOSIS — R1032 Left lower quadrant pain: Secondary | ICD-10-CM | POA: Insufficient documentation

## 2010-12-01 DIAGNOSIS — N83209 Unspecified ovarian cyst, unspecified side: Secondary | ICD-10-CM | POA: Insufficient documentation

## 2010-12-01 HISTORY — DX: Reserved for concepts with insufficient information to code with codable children: IMO0002

## 2010-12-01 HISTORY — DX: Unspecified ovarian cyst, unspecified side: N83.209

## 2010-12-01 HISTORY — DX: Unspecified abnormal cytological findings in specimens from cervix uteri: R87.619

## 2010-12-01 LAB — URINALYSIS, ROUTINE W REFLEX MICROSCOPIC
Bilirubin Urine: NEGATIVE
Leukocytes, UA: NEGATIVE
Specific Gravity, Urine: 1.015 (ref 1.005–1.030)
Urobilinogen, UA: 0.2 mg/dL (ref 0.0–1.0)
pH: 6.5 (ref 5.0–8.0)

## 2010-12-01 LAB — URINE MICROSCOPIC-ADD ON

## 2010-12-01 LAB — CBC
MCH: 29.4 pg (ref 26.0–34.0)
MCV: 87.3 fL (ref 78.0–100.0)
Platelets: 258 10*3/uL (ref 150–400)
RDW: 13.1 % (ref 11.5–15.5)

## 2010-12-01 LAB — POCT PREGNANCY, URINE: Preg Test, Ur: NEGATIVE

## 2010-12-01 MED ORDER — OXYCODONE-ACETAMINOPHEN 5-325 MG PO TABS: 1.0000 | ORAL_TABLET | Freq: Four times a day (QID) | ORAL | Status: AC | PRN

## 2010-12-01 NOTE — ED Provider Notes (Signed)
History   The pt is a 25 year-old female who presents to MAU reporting LLQ pain similar to ovarian cyst pain that she has experienced in the past. She denies fever, chills, N/V/D/C, dizziness, UTI Sx, vaginal discharge or bleeding. She denies high-risk sexual behavior.  Chief Complaint  Patient presents with  . Ovarian Cyst   HPI  Past Medical History  Diagnosis Date  . Abnormal Pap smear   . Ovarian cyst rupture     Past Surgical History  Procedure Date  . Cesarean section   . Cervix lesion destruction   . Wisdom tooth extraction     Family History  Problem Relation Age of Onset  . Anesthesia problems Mother   . Anesthesia problems Other     History  Substance Use Topics  . Smoking status: Never Smoker   . Smokeless tobacco: Not on file  . Alcohol Use: Yes     weekends only    OB History    Grav Para Term Preterm Abortions TAB SAB Ect Mult Living   2 2 1 1      2       Review of Systems  Physical Exam  BP 118/73  Pulse 71  Temp(Src) 98.8 F (37.1 C) (Oral)  Resp 16  Ht 5\' 3"  (1.6 m)  Wt 67.643 kg (149 lb 2 oz)  BMI 26.42 kg/m2  LMP 11/08/2010  Physical Exam Pelvic: declined No CVAT Abd: Soft, mild LLQ tenderness  Results for orders placed during the hospital encounter of 12/01/10 (from the past 24 hour(s))  URINALYSIS, ROUTINE W REFLEX MICROSCOPIC     Status: Abnormal   Collection Time   12/01/10  3:11 PM      Component Value Range   Color, Urine YELLOW  YELLOW    Appearance CLEAR  CLEAR    Specific Gravity, Urine 1.015  1.005 - 1.030    pH 6.5  5.0 - 8.0    Glucose, UA NEGATIVE  NEGATIVE (mg/dL)   Hgb urine dipstick TRACE (*) NEGATIVE    Bilirubin Urine NEGATIVE  NEGATIVE    Ketones, ur NEGATIVE  NEGATIVE (mg/dL)   Protein, ur NEGATIVE  NEGATIVE (mg/dL)   Urobilinogen, UA 0.2  0.0 - 1.0 (mg/dL)   Nitrite NEGATIVE  NEGATIVE    Leukocytes, UA NEGATIVE  NEGATIVE   PREGNANCY, URINE     Status: Normal   Collection Time   12/01/10  3:11 PM   Component Value Range   Preg Test, Ur NEGATIVE    URINE MICROSCOPIC-ADD ON     Status: Abnormal   Collection Time   12/01/10  3:11 PM      Component Value Range   Squamous Epithelial / LPF FEW (*) RARE    RBC / HPF 0-2  <3 (RBC/hpf)   Bacteria, UA FEW (*) RARE   POCT PREGNANCY, URINE     Status: Normal   Collection Time   12/01/10  3:20 PM      Component Value Range   Preg Test, Ur NEGATIVE    CBC     Status: Abnormal   Collection Time   12/01/10  3:49 PM      Component Value Range   WBC 6.4  4.0 - 10.5 (K/uL)   RBC 4.08  3.87 - 5.11 (MIL/uL)   Hemoglobin 12.0  12.0 - 15.0 (g/dL)   HCT 16.1 (*) 09.6 - 46.0 (%)   MCV 87.3  78.0 - 100.0 (fL)   MCH 29.4  26.0 - 34.0 (  pg)   MCHC 33.7  30.0 - 36.0 (g/dL)   RDW 40.9  81.1 - 91.4 (%)   Platelets 258  150 - 400 (K/uL)    ED Course  Procedures Assessment: 1. Left hemorrhagic ovarian cyst  Plan: 1. D/C home 2. Rx Percocet 3. F/U Gyn clinic 6-8 weeks

## 2010-12-01 NOTE — Progress Notes (Signed)
Pt states lmp-11/08/2010, the pain yesterday at 0600, woke pt up, was sharp intermittent pain. Denies bleeding or vaginal d/c changes. As day progresses, pain worsens.

## 2010-12-01 NOTE — Progress Notes (Signed)
Pt c/o abd pain all across the lower bottom of her abd that comes and goes.  It started yesterday morning and worsened til she was in tears.   She took tylenol yesterday and this morning with minimal relief.

## 2011-01-08 ENCOUNTER — Encounter: Payer: Self-pay | Admitting: Obstetrics & Gynecology

## 2011-01-26 ENCOUNTER — Encounter: Payer: Self-pay | Admitting: Obstetrics and Gynecology

## 2011-01-26 ENCOUNTER — Ambulatory Visit (INDEPENDENT_AMBULATORY_CARE_PROVIDER_SITE_OTHER): Payer: Self-pay | Admitting: Obstetrics and Gynecology

## 2011-01-26 VITALS — BP 120/83 | HR 75 | Temp 98.7°F | Ht 63.0 in | Wt 147.2 lb

## 2011-01-26 DIAGNOSIS — N83209 Unspecified ovarian cyst, unspecified side: Secondary | ICD-10-CM

## 2011-02-01 NOTE — Progress Notes (Signed)
Date of visit 01/26/11.  S: Angela [name] presents today to f/u on an ovarian cyst. She was originally seen in the MAU c/o abd pain. She was diagnosed with an ovarian cyst and told to f/u in the GYN clinic. She states she is now doing well and no longer has any pain. She denies abd pain, vag dc, bleeding, or any other sx.  O: VSS A&O x 3 in NAD Abd is soft and nontender to palpations. NL external genitalia. Bimanual exam performed. Uterus is NL size and shape. No adnexal masses. Angela [name] is nontender on exam.  A/P: Hx of ovarian cyst: discussed with Angela [name] at length. No further tx needed at this time. Angela [name] will f/u for her yearly Pap and PE. Discussed diet, activity, risks, and precautions.  Clinton Gallant. Daylen Lipsky III, DrHSc, MPAS, PA-C

## 2011-02-09 LAB — URINALYSIS, ROUTINE W REFLEX MICROSCOPIC
Ketones, ur: >80 — AB
Nitrite: NEGATIVE
Protein, ur: 30 — AB
Urobilinogen, UA: 1

## 2011-02-11 ENCOUNTER — Ambulatory Visit (HOSPITAL_COMMUNITY)
Admission: RE | Admit: 2011-02-11 | Discharge: 2011-02-11 | Disposition: A | Discharge status: Home / Self Care | Payer: PRIVATE HEALTH INSURANCE | Source: Ambulatory Visit | Attending: Obstetrics and Gynecology | Admitting: Obstetrics and Gynecology

## 2011-02-11 DIAGNOSIS — N83209 Unspecified ovarian cyst, unspecified side: Secondary | ICD-10-CM | POA: Insufficient documentation

## 2011-02-17 LAB — WET PREP, GENITAL
Clue Cells Wet Prep HPF POC: NONE SEEN
Trich, Wet Prep: NONE SEEN
Yeast Wet Prep HPF POC: NONE SEEN

## 2011-02-17 LAB — GC/CHLAMYDIA PROBE AMP, GENITAL: Chlamydia, DNA Probe: NEGATIVE

## 2011-02-17 LAB — COMPREHENSIVE METABOLIC PANEL
Albumin: 4
Alkaline Phosphatase: 59
BUN: 5 — ABNORMAL LOW
Calcium: 9
Creatinine, Ser: 0.64
Potassium: 4.3
Total Protein: 7

## 2011-02-17 LAB — URINALYSIS, ROUTINE W REFLEX MICROSCOPIC
Bilirubin Urine: NEGATIVE
Nitrite: NEGATIVE
Specific Gravity, Urine: >1.03 — ABNORMAL HIGH
pH: 5.5

## 2011-02-17 LAB — CBC
HCT: 34.5 — ABNORMAL LOW
Platelets: 268
RDW: 13.3

## 2011-02-17 LAB — POCT PREGNANCY, URINE
Operator id: 220991
Preg Test, Ur: NEGATIVE

## 2011-02-17 LAB — URINE MICROSCOPIC-ADD ON

## 2011-05-01 ENCOUNTER — Encounter: Payer: Self-pay | Admitting: Obstetrics and Gynecology

## 2011-05-01 ENCOUNTER — Ambulatory Visit (INDEPENDENT_AMBULATORY_CARE_PROVIDER_SITE_OTHER): Payer: PRIVATE HEALTH INSURANCE | Admitting: Obstetrics and Gynecology

## 2011-05-01 VITALS — BP 109/71 | HR 83 | Temp 99.0°F | Ht 63.5 in | Wt 146.2 lb

## 2011-05-01 DIAGNOSIS — Z124 Encounter for screening for malignant neoplasm of cervix: Secondary | ICD-10-CM

## 2011-05-01 DIAGNOSIS — Z01419 Encounter for gynecological examination (general) (routine) without abnormal findings: Secondary | ICD-10-CM

## 2011-05-01 NOTE — Progress Notes (Signed)
S: Pt presents today for yearly Pap and PE. She states she is doing well and has no complaints. She is currently using condoms for birth control and does not want to change methods. She reports NL menses and denies pain, vag dc, bleeding, or any other problems. O: VSS A&Ox3 in NAD HEENT: essentially NL Heart: RRR without murmur or gallop LCTAB Breast essentially NL. No nodules or masses. No nipple dc. Abd soft, non-tender to palpation. NL external genitalia. Cervix easily visualized. Pap done without difficulty. Bimanual exam reveals uterus to be NL size and shape. No adnexal masses. Pt non-tender on exam. A/P: Yearly: Pap results should be back in about 2 wks. Pt will return in 14yr for routine PE or prn. Discussed diet, activity, risks, and precautions. Also discussed safe sex practices and offered alternative methods of BC. Pt desires to continue with condoms.  Clinton Gallant. Rice III, DrHSc, MPAS, PA-C

## 2011-07-02 ENCOUNTER — Ambulatory Visit (INDEPENDENT_AMBULATORY_CARE_PROVIDER_SITE_OTHER): Payer: PRIVATE HEALTH INSURANCE | Admitting: Advanced Practice Midwife

## 2011-07-02 ENCOUNTER — Encounter: Payer: Self-pay | Admitting: Advanced Practice Midwife

## 2011-07-02 VITALS — BP 132/88 | HR 81 | Temp 98.1°F | Ht 63.0 in | Wt 148.5 lb

## 2011-07-02 DIAGNOSIS — N939 Abnormal uterine and vaginal bleeding, unspecified: Secondary | ICD-10-CM

## 2011-07-02 DIAGNOSIS — I10 Essential (primary) hypertension: Secondary | ICD-10-CM

## 2011-07-02 DIAGNOSIS — N926 Irregular menstruation, unspecified: Secondary | ICD-10-CM | POA: Insufficient documentation

## 2011-07-02 NOTE — Patient Instructions (Signed)
Menstruation Menstruation is the monthly passing of blood, tissue, fluid and mucus, also know as a period. Your body is shedding the lining of the uterus. The flow, or amount of blood, usually lasts from 3 to 7 days each month. Hormones control the menstrual cycle. Hormones are a chemical substance produced by endocrine glands in the body to regulate different bodily functions. The first menstrual period may start any time between age 27 to 60 years. However, it usually starts around age 23 or 35. Some girls have regular monthly menstrual cycles right from the beginning. However, it is not unusual to have only a couple of drops of blood or spotting when you first start menstruating. It is also not unusual to have two periods a month or miss a month or two when first starting your periods. SYMPTOMS   Mild to moderate abdominal cramps.   Aching or pain in the lower back area.  Symptoms that may occur 5 to 10 days before your menstrual period starts, which is referred to as premenstrual syndrome (PMS). These symptoms can include:  Headache.   Breast tenderness and swelling.   Bloating.   Tiredness (fatigue).   Mood changes.   Craving for certain foods.  These are normal signs and symptoms and can vary in severity. To help relieve these problems, ask your caregiver if you can take over-the-counter medications for pain or discomfort. If the symptoms are not controllable, see your caregiver for help.  HORMONES INVOLVED IN MENSTRUATION Menstruation comes about because of hormones produced by the pituitary gland in the brain and the ovaries that affect the uterine lining. First, the pituitary gland in the brain produces the hormone Follicle Stimulating Hormone [facility]). FSH stimulates the ovaries to produce estrogen, which thickens the uterine lining and begins to develop an egg in the ovary. About 14 days later, the pituitary gland produces another hormone called Luteinizing Hormone (LH). LH causes  the egg to come out of a sac in the ovary (ovulation). The empty sac on the ovary called the corpus luteum is stimulated by another hormone from the pituitary gland called luteotropin. The corpus luteum begins to produce the estrogen and progesterone hormone. The progesterone hormone prepares the lining of the uterus to have the fertilized egg (egg and sperm) attach to the lining of the uterus and begin to develop into a fetus. If the egg is not fertilized, the corpus luteum stops producing estrogen and progesterone, it disappears, the lining of the uterus sloughs off and a menstrual period begins. Then the menstrual cycle starts all over again and will continue monthly unless pregnancy occurs or menopause begins. The secretion of hormones is complex. Various parts of the body become involved in many chemical activities. Female sex hormones have other functions in a woman's body as well. Estrogen increases a woman's sex drive (libido). It naturally helps body get rid of fluids (diuretic). It also aids in the process of building new bone. Therefore, maintaining hormonal health is essential to all levels of a woman's well being. These hormones are usually present in normal amounts and cause you to menstruate. It is the relationship between the (small) levels of the hormones that is critical. When the balance is upset, menstrual irregularities can occur. HOW DOES THE MENSTRUAL CYCLE HAPPEN?  Menstrual cycles vary in length from 21 to 35 days with an average of 29 days. The cycle begins on the first day of bleeding. At this time, the pituitary gland in the brain releases [facility] that travels  through the bloodstream to the ovaries. The [facility] stimulates the follicles in the ovaries. This prepares the body for ovulation that occurs around the 14th day of the cycle. The ovaries produce estrogen, and this makes sure conditions are right in the uterus for implantation of the fertilized egg.   When the levels of estrogen reach  a high enough level, it signals the gland in the brain (pituitary gland) to release a surge of LH. This causes the release of the ripest egg from its follicle (ovulation). Usually only one follicle releases one egg, but sometimes more than one follicle releases an egg especially when stimulating the ovaries for invitro fertilization. The egg can then be collected by either fallopian tube to await fertilization. The burst follicle within the ovary that is left behind is now called the corpus luteum or "yellow body." The corpus luteum continues to give off (secrete) reduced amounts of estrogen. This closes and hardens the cervix. It driesup the mucus to the naturally infertile condition.   The corpus luteum also begins to give off greater amounts of progesterone. This causes the lining of the uterus (endometrium) to thicken even more in preparation for the fertilized egg. The egg is starting to journey down from the fallopian tube to the uterus. It also signals the ovaries to stop releasing eggs. It assists in returning the cervical mucus to its infertile state.   If the egg implants successfully into the womb lining and pregnancy occurs, progesterone levels will continue to raise. It is often this hormone that gives some pregnant women a feeling of well being, like a "natural high." Progesterone levels drop again after childbirth.   If fertilization does not occur, the corpus luteum dies, stopping the production of hormones. This sudden drop in progesterone causes the uterine lining to break down, accompanied by blood (menstruation).   This starts the cycle back at day 1. The whole process starts all over again. Woman go through this cycle every month from puberty to menopause. Women have breaks only for pregnancy and breastfeeding (lactation), unless the woman has health problems that affect the female hormone system or chooses to use oral contraceptives to have unnatural menstrual periods.  HOME CARE  INSTRUCTIONS   Keep track of your periods by using a calendar.   If you use tampons, get the least absorbent to avoid toxic shock syndrome.   Do not leave tampons in the vagina over night or longer than 6 hours.   Wear a sanitary pad over night.   Exercise 3 to 5 times a week or more.   Avoid foods and drinks that you know will make your symptoms worse before or during your period.  SEEK MEDICAL CARE IF:   You develop a fever of 100 F (37.8 C) or higher with your period.   Your periods are lasting more than 7 days.   Your period is so heavy that you have to change pads or tampons every 30 minutes.   You develop clots with your period and never had clots before.   You cannot get relief from over-the-counter medication for your symptoms.   Your period has not started, and it has been longer than 35 days.  Document Released: 04/17/2002 Document Revised: 01/07/2011 Document Reviewed: 02/10/2008 [facility] Patient Information 2012 [place], [place].

## 2011-07-02 NOTE — Progress Notes (Signed)
  Subjective:     Angela [name] is a 26 y.o. woman who presents for irregular menses. Has always had normal periods until her LMP 06/05/11 (which was on time).  Two weeks after that she started bleeding again. She bled off an on for a week and had severe cramping with it. She has now stopped bleeding. She still has some mild cramping, unrelieved by Motrin.   Patient's last menstrual period was 06/06/2011. Menarche age: n/a. Periods are regular every 28-30 days, lasting 5 days. But in February had another bleeding episode for 5-7 days.  Dysmenorrhea:mild, occurring first 1-2 days of flow. Cyclic symptoms include: none. Current contraception: none.History of infertility: no. History of abnormal Pap smear: no.  The following portions of the patient's history were reviewed and updated as appropriate: allergies, current medications, past family history, past medical history, past social history, past surgical history and problem list.  Review of Systems Pertinent items are noted in HPI.     Objective:    BP 132/88  Pulse 81  Temp(Src) 98.1 F (36.7 C) (Oral)  Ht 5\' 3"  (1.6 m)  Wt 148 lb 8 oz (67.359 kg)  BMI 26.31 kg/m2  LMP 06/06/2011 General appearance: alert, cooperative, appears stated age and no distress Abdomen: soft, non-tender; bowel sounds normal; no masses,  no organomegaly Pelvic: cervix normal in appearance, external genitalia normal, no adnexal masses or tenderness, no cervical motion tenderness, rectovaginal septum normal, uterus normal size, shape, and consistency and vagina normal without discharge   Uterus is somewhat tender but not enlarged. No adnexal tenderness.  UPT Negative  Assessment:    The patient has metrorrhagia (midcycle).    Plan:    Diagnosis explained in detail. All questions answered. Agricultural engineer distributed. Discussed option of Provera cycle vs Expectant management and she wishes to proceed with expectant observation  I told her if  it happens again, and she wants to try Provera, she can call here and I will authorize it without a visit. If the pattern persists, may need additional testing/follow-up.

## 2012-01-08 ENCOUNTER — Inpatient Hospital Stay (HOSPITAL_COMMUNITY): Payer: Self-pay

## 2012-01-08 ENCOUNTER — Inpatient Hospital Stay (HOSPITAL_COMMUNITY)
Admission: AD | Admit: 2012-01-08 | Discharge: 2012-01-08 | Disposition: A | Discharge status: Home / Self Care | Payer: Self-pay | Source: Ambulatory Visit | Attending: Family Medicine | Admitting: Family Medicine

## 2012-01-08 ENCOUNTER — Encounter (HOSPITAL_COMMUNITY): Payer: Self-pay

## 2012-01-08 DIAGNOSIS — N949 Unspecified condition associated with female genital organs and menstrual cycle: Secondary | ICD-10-CM | POA: Insufficient documentation

## 2012-01-08 DIAGNOSIS — N83209 Unspecified ovarian cyst, unspecified side: Secondary | ICD-10-CM | POA: Insufficient documentation

## 2012-01-08 DIAGNOSIS — I10 Essential (primary) hypertension: Secondary | ICD-10-CM

## 2012-01-08 DIAGNOSIS — N83299 Other ovarian cyst, unspecified side: Secondary | ICD-10-CM

## 2012-01-08 DIAGNOSIS — R1031 Right lower quadrant pain: Secondary | ICD-10-CM | POA: Insufficient documentation

## 2012-01-08 LAB — URINALYSIS, ROUTINE W REFLEX MICROSCOPIC
Bilirubin Urine: NEGATIVE
Hgb urine dipstick: NEGATIVE
Ketones, ur: NEGATIVE mg/dL
Protein, ur: NEGATIVE mg/dL
Urobilinogen, UA: 0.2 mg/dL (ref 0.0–1.0)

## 2012-01-08 LAB — CBC WITH DIFFERENTIAL/PLATELET
Basophils Absolute: 0 10*3/uL (ref 0.0–0.1)
Basophils Relative: 0 % (ref 0–1)
Eosinophils Relative: 0 % (ref 0–5)
HCT: 36.4 % (ref 36.0–46.0)
MCH: 30.1 pg (ref 26.0–34.0)
MCHC: 33.8 g/dL (ref 30.0–36.0)
MCV: 89 fL (ref 78.0–100.0)
Monocytes Absolute: 0.6 10*3/uL (ref 0.1–1.0)
RDW: 13.1 % (ref 11.5–15.5)

## 2012-01-08 LAB — WET PREP, GENITAL: Yeast Wet Prep HPF POC: NONE SEEN

## 2012-01-08 MED ORDER — KETOROLAC TROMETHAMINE 60 MG/2ML IM SOLN
60.0000 mg | Freq: Once | INTRAMUSCULAR | Status: AC
  Administered 2012-01-08: 60 mg via INTRAMUSCULAR
  Filled 2012-01-08: qty 2

## 2012-01-08 MED ORDER — OXYCODONE-ACETAMINOPHEN 5-325 MG PO TABS: 2.0000 | ORAL_TABLET | ORAL | Status: AC | PRN

## 2012-01-08 MED ORDER — NAPROXEN SODIUM 550 MG PO TABS: 550.0000 mg | ORAL_TABLET | Freq: Two times a day (BID) | ORAL | Status: DC

## 2012-01-08 NOTE — MAU Provider Note (Signed)
History     CSN: [id number]  Arrival date and time: 01/08/12 8469   First Provider Initiated Contact with Patient 01/08/12 1039      Chief Complaint  Patient presents with  . Abdominal Pain   HPI Angela [name] is a.age female who presents to MAU with abdominal pain. The pain is located in the right lower quadrant and supra pubic area. The pain started last night. The pain started abruptly during sexual intercourse and then got worse. The pain radiates to the lower back. She describes the pain as dull aching, cramping. She rates the pain as 10/10 last night and 5/10 today. Associated symptoms include nausea, vomiting, sweating. Today continues to have pain but no nausea. Last pap smear less than one year and was normal. Hx of GC and Chlamydia at age 89, none since then. No birth control. Patient in the GYN clinic. The history was provided by the patient.  OB History    Grav Para Term Preterm Abortions TAB SAB Ect Mult Living   2 2 1 1      2       Past Medical History  Diagnosis Date  . Abnormal Pap smear   . Ovarian cyst rupture     Past Surgical History  Procedure Date  . Cervix lesion destruction   . Wisdom tooth extraction   . Cesarean section     x2  . Colposcopy     Family History  Problem Relation Age of Onset  . Anesthesia problems Mother   . Thyroid disease Mother   . Fibromyalgia Mother   . Sarcoidosis Mother   . Anesthesia problems Other   . Diabetes Father   . Hypertension Sister   . Cancer Maternal Grandfather     prostate  . Other Neg Hx     History  Substance Use Topics  . Smoking status: Never Smoker   . Smokeless tobacco: Never Used  . Alcohol Use: 1.8 oz/week    2 Glasses of wine, 1 Shots of liquor per week     weekends only    Allergies:  Allergies  Allergen Reactions  . Amoxicillin Hives    Prescriptions prior to admission  Medication Sig Dispense Refill  . Multiple Vitamin (MULTIVITAMIN PO) Take 1 tablet by mouth daily.           Review of Systems  Constitutional: Negative for fever, chills and weight loss.  HENT: Negative for ear pain, nosebleeds, congestion, sore throat and neck pain.   Eyes: Negative for blurred vision, double vision, photophobia and pain.  Respiratory: Negative for cough, shortness of breath and wheezing.   Cardiovascular: Negative for chest pain, palpitations and leg swelling.  Gastrointestinal: Positive for heartburn, nausea and abdominal pain. Negative for vomiting, diarrhea and constipation.  Genitourinary: Negative for dysuria, urgency and frequency.  Musculoskeletal: Positive for back pain. Negative for myalgias.  Skin: Negative for itching and rash.  Neurological: Negative for dizziness, sensory change, speech change, seizures, weakness and headaches.  Endo/Heme/Allergies: Does not bruise/bleed easily.  Psychiatric/Behavioral: Negative for depression. The patient is not nervous/anxious.    Physical Exam   Blood pressure 141/98, pulse 90, temperature 99.7 F (37.6 C), temperature source Oral, resp. rate 16, height 5' 3.5" (1.613 m), weight 151 lb 9.6 oz (68.765 kg), last menstrual period 12/19/2011, SpO2 100.00%.  Physical Exam  Nursing note and vitals reviewed. Constitutional: She is oriented to person, place, and time. She appears well-developed and well-nourished.  HENT:  Head: Normocephalic.  Cardiovascular:  Normal rate.   Respiratory: Effort normal.  GI: Soft. There is tenderness in the right lower quadrant. There is no rebound, no guarding and no CVA tenderness.  Genitourinary:       External genitalia without lesions. White discharge vaginal vault. Positive CMT, right adnexal tenderness. Uterus without palpable enlargement.  Musculoskeletal: Normal range of motion.  Neurological: She is alert and oriented to person, place, and time.  Skin: Skin is warm and dry.  Psychiatric: She has a normal mood and affect. Her behavior is normal. Judgment and thought content normal.     Results for orders placed during the hospital encounter of 01/08/12 (from the past 24 hour(s))  URINALYSIS, ROUTINE W REFLEX MICROSCOPIC     Status: Abnormal   Collection Time   01/08/12  9:20 AM      Component Value Range   Color, Urine YELLOW  YELLOW   APPearance CLEAR  CLEAR   Specific Gravity, Urine 1.015  1.005 - 1.030   pH 8.5 (*) 5.0 - 8.0   Glucose, UA NEGATIVE  NEGATIVE mg/dL   Hgb urine dipstick NEGATIVE  NEGATIVE   Bilirubin Urine NEGATIVE  NEGATIVE   Ketones, ur NEGATIVE  NEGATIVE mg/dL   Protein, ur NEGATIVE  NEGATIVE mg/dL   Urobilinogen, UA 0.2  0.0 - 1.0 mg/dL   Nitrite NEGATIVE  NEGATIVE   Leukocytes, UA NEGATIVE  NEGATIVE  POCT PREGNANCY, URINE     Status: Normal   Collection Time   01/08/12  9:31 AM      Component Value Range   Preg Test, Ur NEGATIVE  NEGATIVE  WET PREP, GENITAL     Status: Abnormal   Collection Time   01/08/12 10:45 AM      Component Value Range   Yeast Wet Prep HPF POC NONE SEEN  NONE SEEN   Trich, Wet Prep NONE SEEN  NONE SEEN   Clue Cells Wet Prep HPF POC NONE SEEN  NONE SEEN   WBC, Wet Prep HPF POC FEW (*) NONE SEEN  CBC WITH DIFFERENTIAL     Status: Normal   Collection Time   01/08/12 11:05 AM      Component Value Range   WBC 7.0  4.0 - 10.5 K/uL   RBC 4.09  3.87 - 5.11 MIL/uL   Hemoglobin 12.3  12.0 - 15.0 g/dL   HCT 10.9  60.4 - 54.0 %   MCV 89.0  78.0 - 100.0 fL   MCH 30.1  26.0 - 34.0 pg   MCHC 33.8  30.0 - 36.0 g/dL   RDW 98.1  19.1 - 47.8 %   Platelets 272  150 - 400 K/uL   Neutrophils Relative 57  43 - 77 %   Neutro Abs 4.0  1.7 - 7.7 K/uL   Lymphocytes Relative 33  12 - 46 %   Lymphs Abs 2.3  0.7 - 4.0 K/uL   Monocytes Relative 9  3 - 12 %   Monocytes Absolute 0.6  0.1 - 1.0 K/uL   Eosinophils Relative 0  0 - 5 %   Eosinophils Absolute 0.0  0.0 - 0.7 K/uL   Basophils Relative 0  0 - 1 %   Basophils Absolute 0.0  0.0 - 0.1 K/uL    MAU Course: Toradol for pain 60 mg IM  Procedures US Transvaginal  Non-ob  01/08/2012  *RADIOLOGY REPORT*  Clinical Data: Pelvic pain after intercourse.  History of ovarian cyst rupture.  LMP 12/19/2011  TRANSABDOMINAL AND TRANSVAGINAL ULTRASOUND OF PELVIS  Technique:  Both transabdominal and transvaginal ultrasound examinations of the pelvis were performed. Transabdominal technique was performed for global imaging of the pelvis including uterus, ovaries, adnexal regions, and pelvic cul-de-sac.  It was necessary to proceed with endovaginal exam following the transabdominal exam to visualize the adnexa.  Comparison:  02/11/2011  Findings:  Uterus: The uterus is anteverted and anteflexed and demonstrates a sagittal length of 10 cm, depth of 4.6 cm and width of 5.7 cm.  A C- section scar is identified in the anterior lower uterine segment. No other focal myometrial abnormality is seen.  Endometrium: Appears homogeneously echogenic with a width of 12 mm. No areas of focal thickening or heterogeneity are seen and this would correlate with a presecretory endometrial stripe and correspond with the provided LMP of 12/19/2011  Right ovary:  Measures 4.5 x 3.2 x 2.8 cm and contains a small complex cystic lesion measuring 2.7 x 2.0 x 1.5 cm which contains wispy avascular internal fibrinous changes and has an appearance compatible with a hemorrhagic cyst  Left ovary: Measures 4.6 x 2.2 x 3.3 cm and contains a complex cystic lesion measuring 2.6 x 2.1 x 2.2 cm.  This contains some avascular retracting soft tissue with acute angulation that has an appearance compatible with a hemorrhagic cyst.  Some peripheral flow is seen around this area and I suspect this represents a hemorrhagic corpus luteum.  Other findings: A small amount of simple free fluid is identified adjacent to both ovaries extending into the cul-de-sac.  IMPRESSION: Small bilateral complex ovarian cystic lesions with an appearance typical for hemorrhagic cysts and flow pattern suspicious for a hemorrhagic corpus luteum on the  left.  Associated pelvic fluid may indicate recent ovulation. Given the history of pelvic pain, follow- up can be performed in the immediate postsecretory phase of the cycle following the next complete cycle to assess for expected resolution of these probable functional cysts.  .   Original Report Authenticated By: Bertha Stakes, M.D.    US Pelvis Complete  01/08/2012  *RADIOLOGY REPORT*  Clinical Data: Pelvic pain after intercourse.  History of ovarian cyst rupture.  LMP 12/19/2011  TRANSABDOMINAL AND TRANSVAGINAL ULTRASOUND OF PELVIS Technique:  Both transabdominal and transvaginal ultrasound examinations of the pelvis were performed. Transabdominal technique was performed for global imaging of the pelvis including uterus, ovaries, adnexal regions, and pelvic cul-de-sac.  It was necessary to proceed with endovaginal exam following the transabdominal exam to visualize the adnexa.  Comparison:  02/11/2011  Findings:  Uterus: The uterus is anteverted and anteflexed and demonstrates a sagittal length of 10 cm, depth of 4.6 cm and width of 5.7 cm.  A C- section scar is identified in the anterior lower uterine segment. No other focal myometrial abnormality is seen.  Endometrium: Appears homogeneously echogenic with a width of 12 mm. No areas of focal thickening or heterogeneity are seen and this would correlate with a presecretory endometrial stripe and correspond with the provided LMP of 12/19/2011  Right ovary:  Measures 4.5 x 3.2 x 2.8 cm and contains a small complex cystic lesion measuring 2.7 x 2.0 x 1.5 cm which contains wispy avascular internal fibrinous changes and has an appearance compatible with a hemorrhagic cyst  Left ovary: Measures 4.6 x 2.2 x 3.3 cm and contains a complex cystic lesion measuring 2.6 x 2.1 x 2.2 cm.  This contains some avascular retracting soft tissue with acute angulation that has an appearance compatible with a hemorrhagic cyst.  Some peripheral flow is seen  around this area and  I suspect this represents a hemorrhagic corpus luteum.  Other findings: A small amount of simple free fluid is identified adjacent to both ovaries extending into the cul-de-sac.  IMPRESSION: Small bilateral complex ovarian cystic lesions with an appearance typical for hemorrhagic cysts and flow pattern suspicious for a hemorrhagic corpus luteum on the left.  Associated pelvic fluid may indicate recent ovulation. Given the history of pelvic pain, follow- up can be performed in the immediate postsecretory phase of the cycle following the next complete cycle to assess for expected resolution of these probable functional cysts.  .   Original Report Authenticated By: Bertha Stakes, M.D.    Assessment: 26 y.o. female with pelvic pain   Bilateral hemorrhagic ovarian cysts  Plan:  Anaprox DS   Percocet   Follow up in GYN Clinic 4 weeks, return here for problems. Medication List  As of 01/12/2012  1:29 AM   START taking these medications         naproxen sodium 550 MG tablet   Commonly known as: ANAPROX   Take 1 tablet (550 mg total) by mouth 2 (two) times daily with a meal.      oxyCODONE-acetaminophen 5-325 MG per tablet   Commonly known as: PERCOCET/ROXICET   Take 2 tablets by mouth every 4 (four) hours as needed for pain.         CONTINUE taking these medications         MULTIVITAMIN PO          Where to get your medications    These are the prescriptions that you need to pick up. We sent them to a specific pharmacy, so you will need to go there to get them.   [facility] [address] [place] - [address]    [address] [place] [address]    Phone: [phone]        naproxen sodium 550 MG tablet         You may get these medications from any pharmacy.         oxyCODONE-acetaminophen 5-325 MG per tablet          Discussed with the patient and all questioned fully answered. She will return if any problems arise. Follow-up Information     Follow up with [facility]. (someone will call you)    Contact information:   [address] [place] [address]          Kerrie Buffalo, RN, FNP, [facility] 01/08/2012, 10:39 AM

## 2012-01-08 NOTE — MAU Note (Signed)
Patient states that during intercourse last night she was a little uncomfortable. Afterward she had sudden intense abdominal pain that caused her to have vomiting and cold sweats, dizzy and pain radiating up into the chest. Was able to sleep but continues to have abdominal pain this am, no vomiting.

## 2012-01-09 LAB — GC/CHLAMYDIA PROBE AMP, GENITAL
Chlamydia, DNA Probe: NEGATIVE
GC Probe Amp, Genital: NEGATIVE

## 2012-01-12 NOTE — MAU Provider Note (Signed)
Chart reviewed and agree with management and plan.  

## 2012-02-08 ENCOUNTER — Ambulatory Visit: Payer: PRIVATE HEALTH INSURANCE | Admitting: Obstetrics & Gynecology

## 2012-04-06 ENCOUNTER — Telehealth: Payer: Self-pay | Admitting: Obstetrics and Gynecology

## 2012-04-12 ENCOUNTER — Ambulatory Visit (INDEPENDENT_AMBULATORY_CARE_PROVIDER_SITE_OTHER): Payer: Self-pay | Admitting: Obstetrics and Gynecology

## 2012-04-12 ENCOUNTER — Encounter: Payer: Self-pay | Admitting: Obstetrics and Gynecology

## 2012-04-12 DIAGNOSIS — Z331 Pregnant state, incidental: Secondary | ICD-10-CM

## 2012-04-12 LAB — POCT URINALYSIS DIPSTICK
Blood, UA: NEGATIVE
Glucose, UA: NEGATIVE
Ketones, UA: NEGATIVE
Spec Grav, UA: <=1.005
Urobilinogen, UA: NEGATIVE

## 2012-04-12 NOTE — Progress Notes (Signed)
NOB interview. Denies any problems.

## 2012-04-14 ENCOUNTER — Other Ambulatory Visit: Payer: Self-pay | Admitting: Obstetrics and Gynecology

## 2012-04-14 LAB — PRENATAL PANEL VII
Basophils Absolute: 0 10*3/uL (ref 0.0–0.1)
Basophils Relative: 0 % (ref 0–1)
Eosinophils Absolute: 0.1 10*3/uL (ref 0.0–0.7)
HIV: NONREACTIVE
Hepatitis B Surface Ag: NEGATIVE
MCH: 30.1 pg (ref 26.0–34.0)
MCHC: 33.6 g/dL (ref 30.0–36.0)
Monocytes Absolute: 0.8 10*3/uL (ref 0.1–1.0)
Neutro Abs: 4.3 10*3/uL (ref 1.7–7.7)
Neutrophils Relative %: 54 % (ref 43–77)
RDW: 13.3 % (ref 11.5–15.5)
Rh Type: POSITIVE

## 2012-04-14 LAB — HEMOGLOBINOPATHY EVALUATION
Hgb A2 Quant: 2.8 % (ref 2.2–3.2)
Hgb A: 97.2 % (ref 96.8–97.8)
Hgb F Quant: 0 % (ref 0.0–2.0)

## 2012-04-14 LAB — CULTURE, OB URINE: Colony Count: 9000

## 2012-04-14 MED ORDER — PROMETHAZINE HCL 25 MG PO TABS: 25.0000 mg | ORAL_TABLET | Freq: Four times a day (QID) | ORAL | Status: DC | PRN

## 2012-04-14 NOTE — Telephone Encounter (Signed)
Tc to pt regarding msg.  Pt denies unable to keep down fluids greater 24 hours.  E-prescribed pt Phenergan to pharmacy of choice.

## 2012-04-26 ENCOUNTER — Encounter: Payer: Medicaid Other | Admitting: Obstetrics and Gynecology

## 2012-04-28 ENCOUNTER — Encounter: Payer: Self-pay | Admitting: Obstetrics and Gynecology

## 2012-04-28 ENCOUNTER — Ambulatory Visit (INDEPENDENT_AMBULATORY_CARE_PROVIDER_SITE_OTHER): Payer: Self-pay | Admitting: Obstetrics and Gynecology

## 2012-04-28 VITALS — BP 100/58 | Wt 159.0 lb

## 2012-04-28 DIAGNOSIS — Z331 Pregnant state, incidental: Secondary | ICD-10-CM

## 2012-04-28 MED ORDER — ONDANSETRON HCL 4 MG PO TABS: 4.0000 mg | ORAL_TABLET | Freq: Three times a day (TID) | ORAL | Status: DC | PRN

## 2012-04-28 NOTE — Progress Notes (Signed)
Pt is here today for her NOB work-up. Pt stated still feeling nausea and dizzy. Last pap 2013 [facility]nl [facility]. No other issues today.

## 2012-04-28 NOTE — Patient Instructions (Signed)
ABCs of Pregnancy  A  Antepartum care is very important. Be sure you see your doctor and get prenatal care as soon as you think you are pregnant. At this time, you will be tested for infection, genetic abnormalities and potential problems with you and the pregnancy. This is the time to discuss diet, exercise, work, medications, labor, pain medication during labor and the possibility of a cesarean delivery. Ask any questions that may concern you. It is important to see your doctor regularly throughout your pregnancy. Avoid exposure to toxic substances and chemicals - such as cleaning solvents, lead and mercury, some insecticides, and paint. Pregnant women should avoid exposure to paint fumes, and fumes that cause you to feel ill, dizzy or faint. When possible, it is a good idea to have a pre-pregnancy consultation with your caregiver to begin some important recommendations your caregiver suggests such as, taking folic acid, exercising, quitting smoking, avoiding alcoholic beverages, etc.  B  Breastfeeding is the healthiest choice for both you and your baby. It has many nutritional benefits for the baby and health benefits for the mother. It also creates a very tight and loving bond between the baby and mother. Talk to your doctor, your family and friends, and your employer about how you choose to feed your baby and how they can support you in your decision. Not all birth defects can be prevented, but a woman can take actions that may increase her chance of having a healthy baby. Many birth defects happen very early in pregnancy, sometimes before a woman even knows she is pregnant. Birth defects or abnormalities of any child in your or the father's family should be discussed with your caregiver. Get a good support bra as your breast size changes. Wear it especially when you exercise and when nursing.   C  Celebrate the news of your pregnancy with the your spouse/father and family. Childbirth classes are helpful to  take for you and the spouse/father because it helps to understand what happens during the pregnancy, labor and delivery. Cesarean delivery should be discussed with your doctor so you are prepared for that possibility. The pros and cons of circumcision if it is a boy, should be discussed with your pediatrician. Cigarette smoking during pregnancy can result in low birth weight babies. It has been associated with infertility, miscarriages, tubal pregnancies, infant death (mortality) and poor health (morbidity) in childhood. Additionally, cigarette smoking may cause long-term learning disabilities. If you smoke, you should try to quit before getting pregnant and not smoke during the pregnancy. Secondary smoke may also harm a mother and her developing baby. It is a good idea to ask people to stop smoking around you during your pregnancy and after the baby is born. Extra calcium is necessary when you are pregnant and is found in your prenatal vitamin, in dairy products, green leafy vegetables and in calcium supplements.  D  A healthy diet according to your current weight and height, along with vitamins and mineral supplements should be discussed with your caregiver. Domestic abuse or violence should be made known to your doctor right away to get the situation corrected. Drink more water when you exercise to keep hydrated. Discomfort of your back and legs usually develops and progresses from the middle of the second trimester through to delivery of the baby. This is because of the enlarging baby and uterus, which may also affect your balance. Do not take illegal drugs. Illegal drugs can seriously harm the baby and you. Drink extra   fluids (water is best) throughout pregnancy to help your body keep up with the increases in your blood volume. Drink at least 6 to 8 glasses of water, fruit juice, or milk each day. A good way to know you are drinking enough fluid is when your urine looks almost like clear water or is very light  yellow.   E  Eat healthy to get the nutrients you and your unborn baby need. Your meals should include the five basic food groups. Exercise (30 minutes of light to moderate exercise a day) is important and encouraged during pregnancy, if there are no medical problems or problems with the pregnancy. Exercise that causes discomfort or dizziness should be stopped and reported to your caregiver. Emotions during pregnancy can change from being ecstatic to depression and should be understood by you, your partner and your family.  F  Fetal screening with ultrasound, amniocentesis and monitoring during pregnancy and labor is common and sometimes necessary. Take 400 micrograms of folic acid daily both before, when possible, and during the first few months of pregnancy to reduce the risk of birth defects of the brain and spine. All women who could possibly become pregnant should take a vitamin with folic acid, every day. It is also important to eat a healthy diet with fortified foods (enriched grain products, including cereals, rice, breads, and pastas) and foods with natural sources of folate (orange juice, green leafy vegetables, beans, peanuts, broccoli, asparagus, peas, and lentils). The father should be involved with all aspects of the pregnancy including, the prenatal care, childbirth classes, labor, delivery, and postpartum time. Fathers may also have emotional concerns about being a father, financial needs, and raising a family.  G  Genetic testing should be done appropriately. It is important to know your family and the father's history. If there have been problems with pregnancies or birth defects in your family, report these to your doctor. Also, genetic counselors can talk with you about the information you might need in making decisions about having a family. You can call a major medical center in your area for help in finding a board-certified genetic counselor. Genetic testing and counseling should be done  before pregnancy when possible, especially if there is a history of problems in the mother's or father's family. Certain ethnic backgrounds are more at risk for genetic defects.  H  Get familiar with the hospital where you will be having your baby. Get to know how long it takes to get there, the labor and delivery area, and the hospital procedures. Be sure your medical insurance is accepted there. Get your home ready for the baby including, clothes, the baby's room (when possible), furniture and car seat. Hand washing is important throughout the day, especially after handling raw meat and poultry, changing the baby's diaper or using the bathroom. This can help prevent the spread of many bacteria and viruses that cause infection. Your hair may become dry and thinner, but will return to normal a few weeks after the baby is born. Heartburn is a common problem that can be treated by taking antacids recommended by your caregiver, eating smaller meals 5 or 6 times a day, not drinking liquids when eating, drinking between meals and raising the head of your bed 2 to 3 inches.  I  Insurance to cover you, the baby, doctor and hospital should be reviewed so that you will be prepared to pay any costs not covered by your insurance plan. If you do not have medical insurance,   there are usually clinics and services available for you in your community. Take 30 milligrams of iron during your pregnancy as prescribed by your doctor to reduce the risk of low red blood cells (anemia) later in pregnancy. All women of childbearing age should eat a diet rich in iron.  J  There should be a joint effort for the mother, father and any other children to adapt to the pregnancy financially, emotionally, and psychologically during the pregnancy. Join a support group for moms-to-be. Or, join a class on parenting or childbirth. Have the family participate when possible.  K  Know your limits. Let your caregiver know if you experience any of the  following:   · Pain of any kind.  · Strong cramps.  · You develop a lot of weight in a short period of time (5 pounds in 3 to 5 days).  · Vaginal bleeding, leaking of amniotic fluid.  · Headache, vision problems.  · Dizziness, fainting, shortness of breath.  · Chest pain.  · Fever of 102° F (38.9° C) or higher.  · Gush of clear fluid from your vagina.  · Painful urination.  · Domestic violence.  · Irregular heartbeat (palpitations).  · Rapid beating of the heart (tachycardia).  · Constant feeling sick to your stomach (nauseous) and vomiting.  · Trouble walking, fluid retention (edema).  · Muscle weakness.  · If your baby has decreased activity.  · Persistent diarrhea.  · Abnormal vaginal discharge.  · Uterine contractions at 20-minute intervals.  · Back pain that travels down your leg.  L  Learn and practice that what you eat and drink should be in moderation and healthy for you and your baby. Legal drugs such as alcohol and caffeine are important issues for pregnant women. There is no safe amount of alcohol a woman can drink while pregnant. Fetal alcohol syndrome, a disorder characterized by growth retardation, facial abnormalities, and central nervous system dysfunction, is caused by a woman's use of alcohol during pregnancy. Caffeine, found in tea, coffee, soft drinks and chocolate, should also be limited. Be sure to read labels when trying to cut down on caffeine during pregnancy. More than 200 foods, beverages, and over-the-counter medications contain caffeine and have a high salt content! There are coffees and teas that do not contain caffeine.  M  Medical conditions such as diabetes, epilepsy, and high blood pressure should be treated and kept under control before pregnancy when possible, but especially during pregnancy. Ask your caregiver about any medications that may need to be changed or adjusted during pregnancy. If you are currently taking any medications, ask your caregiver if it is safe to take them  while you are pregnant or before getting pregnant when possible. Also, be sure to discuss any herbs or vitamins you are taking. They are medicines, too! Discuss with your doctor all medications, prescribed and over-the-counter, that you are taking. During your prenatal visit, discuss the medications your doctor may give you during labor and delivery.  N  Never be afraid to ask your doctor or caregiver questions about your health, the progress of the pregnancy, family problems, stressful situations, and recommendation for a pediatrician, if you do not have one. It is better to take all precautions and discuss any questions or concerns you may have during your office visits. It is a good idea to write down your questions before you visit the doctor.  O  Over-the-counter cough and cold remedies may contain alcohol or other ingredients that should   be avoided during pregnancy. Ask your caregiver about prescription, herbs or over-the-counter medications that you are taking or may consider taking while pregnant.   P  Physical activity during pregnancy can benefit both you and your baby by lessening discomfort and fatigue, providing a sense of well-being, and increasing the likelihood of early recovery after delivery. Light to moderate exercise during pregnancy strengthens the belly (abdominal) and back muscles. This helps improve posture. Practicing yoga, walking, swimming, and cycling on a stationary bicycle are usually safe exercises for pregnant women. Avoid scuba diving, exercise at high altitudes (over 3000 feet), skiing, horseback riding, contact sports, etc. Always check with your doctor before beginning any kind of exercise, especially during pregnancy and especially if you did not exercise before getting pregnant.  Q  Queasiness, stomach upset and morning sickness are common during pregnancy. Eating a couple of crackers or dry toast before getting out of bed. Foods that you normally love may make you feel sick to  your stomach. You may need to substitute other nutritious foods. Eating 5 or 6 small meals a day instead of 3 large ones may make you feel better. Do not drink with your meals, drink between meals. Questions that you have should be written down and asked during your prenatal visits.  R  Read about and make plans to baby-proof your home. There are important tips for making your home a safer environment for your baby. Review the tips and make your home safer for you and your baby. Read food labels regarding calories, salt and fat content in the food.  S  Saunas, hot tubs, and steam rooms should be avoided while you are pregnant. Excessive high heat may be harmful during your pregnancy. Your caregiver will screen and examine you for sexually transmitted diseases and genetic disorders during your prenatal visits. Learn the signs of labor. Sexual relations while pregnant is safe unless there is a medical or pregnancy problem and your caregiver advises against it.  T  Traveling long distances should be avoided especially in the third trimester of your pregnancy. If you do have to travel out of state, be sure to take a copy of your medical records and medical insurance plan with you. You should not travel long distances without seeing your doctor first. Most airlines will not allow you to travel after 36 weeks of pregnancy. Toxoplasmosis is an infection caused by a parasite that can seriously harm an unborn baby. Avoid eating undercooked meat and handling cat litter. Be sure to wear gloves when gardening. Tingling of the hands and fingers is not unusual and is due to fluid retention. This will go away after the baby is born.  U  Womb (uterus) size increases during the first trimester. Your kidneys will begin to function more efficiently. This may cause you to feel the need to urinate more often. You may also leak urine when sneezing, coughing or laughing. This is due to the growing uterus pressing against your bladder,  which lies directly in front of and slightly under the uterus during the first few months of pregnancy. If you experience burning along with frequency of urination or bloody urine, be sure to tell your doctor. The size of your uterus in the third trimester may cause a problem with your balance. It is advisable to maintain good posture and avoid wearing high heels during this time. An ultrasound of your baby may be necessary during your pregnancy and is safe for you and your baby.  V    Vaccinations are an important concern for pregnant women. Get needed vaccines before pregnancy. Center for Disease Control (www.cdc.gov) has clear guidelines for the use of vaccines during pregnancy. Review the list, be sure to discuss it with your doctor. Prenatal vitamins are helpful and healthy for you and the baby. Do not take extra vitamins except what is recommended. Taking too much of certain vitamins can cause overdose problems. Continuous vomiting should be reported to your caregiver. Varicose veins may appear especially if there is a family history of varicose veins. They should subside after the delivery of the baby. Support hose helps if there is leg discomfort.  W  Being overweight or underweight during pregnancy may cause problems. Try to get within 15 pounds of your ideal weight before pregnancy. Remember, pregnancy is not a time to be dieting! Do not stop eating or start skipping meals as your weight increases. Both you and your baby need the calories and nutrition you receive from a healthy diet. Be sure to consult with your doctor about your diet. There is a formula and diet plan available depending on whether you are overweight or underweight. Your caregiver or nutritionist can help and advise you if necessary.  X  Avoid X-rays. If you must have dental work or diagnostic tests, tell your dentist or physician that you are pregnant so that extra care can be taken. X-rays should only be taken when the risks of not taking  them outweigh the risk of taking them. If needed, only the minimum amount of radiation should be used. When X-rays are necessary, protective lead shields should be used to cover areas of the body that are not being X-rayed.  Y  Your baby loves you. Breastfeeding your baby creates a loving and very close bond between the two of you. Give your baby a healthy environment to live in while you are pregnant. Infants and children require constant care and guidance. Their health and safety should be carefully watched at all times. After the baby is born, rest or take a nap when the baby is sleeping.  Z  Get your ZZZs. Be sure to get plenty of rest. Resting on your side as often as possible, especially on your left side is advised. It provides the best circulation to your baby and helps reduce swelling. Try taking a nap for 30 to 45 minutes in the afternoon when possible. After the baby is born rest or take a nap when the baby is sleeping. Try elevating your feet for that amount of time when possible. It helps the circulation in your legs and helps reduce swelling.   Most information courtesy of the CDC.  Document Released: 04/27/2005 Document Revised: 07/20/2011 Document Reviewed: 01/09/2009  [employer]® Patient Information ©2013 [employer].

## 2012-04-28 NOTE — Progress Notes (Signed)
[redacted]w[redacted]d Pt here for New OB work up.  She is without c/o she occ has nausea Physical Examination: General appearance - alert, well appearing, and in no distress Neck - supple, no significant adenopathy, thyroid exam: thyroid is normal in size without nodules or tenderness Chest - clear to auscultation, no wheezes, rales or rhonchi, symmetric air entry Heart - normal rate and regular rhythm Abdomen - soft, nontender, nondistended, no masses or organomegaly Breasts - breasts appear normal, no suspicious masses, no skin or nipple changes or axillary nodes Pelvic - normal external genitalia, vulva, vagina, cervix, uterus and adnexa.  Uterus 2 week size Extremities - peripheral pulses normal, no pedal edema, no clubbing or cyanosis IUP at 10 weeks zofran and phenergan prn n/v Pt desires first trimester screen.  Will schedule

## 2012-05-06 ENCOUNTER — Telehealth: Payer: Self-pay | Admitting: Obstetrics and Gynecology

## 2012-05-06 NOTE — Telephone Encounter (Signed)
Tc to pt per telephone call. Pt wants to know if she can take chewable PNV's due to nausea. Informed may try any chewable otc PNV if desires. Pt voices understanding.

## 2012-05-09 ENCOUNTER — Other Ambulatory Visit: Payer: Self-pay

## 2012-05-09 DIAGNOSIS — Z36 Encounter for antenatal screening of mother: Secondary | ICD-10-CM

## 2012-05-12 ENCOUNTER — Other Ambulatory Visit: Payer: Self-pay | Admitting: Obstetrics and Gynecology

## 2012-05-12 ENCOUNTER — Ambulatory Visit (INDEPENDENT_AMBULATORY_CARE_PROVIDER_SITE_OTHER): Payer: Self-pay

## 2012-05-12 ENCOUNTER — Telehealth: Payer: Self-pay | Admitting: Obstetrics and Gynecology

## 2012-05-12 DIAGNOSIS — Z36 Encounter for antenatal screening of mother: Secondary | ICD-10-CM

## 2012-05-12 LAB — US OB COMP LESS 14 WKS

## 2012-05-12 MED ORDER — ONDANSETRON HCL 4 MG PO TABS: 4.0000 mg | ORAL_TABLET | Freq: Three times a day (TID) | ORAL | Status: DC | PRN

## 2012-05-12 NOTE — Telephone Encounter (Signed)
Tc to pt per telephone call. Rx for Zofran e-pres to pharm on file.

## 2012-05-26 ENCOUNTER — Ambulatory Visit: Payer: Medicaid Other | Admitting: Obstetrics and Gynecology

## 2012-05-26 VITALS — BP 110/62 | Wt 163.0 lb

## 2012-05-26 DIAGNOSIS — Z3480 Encounter for supervision of other normal pregnancy, unspecified trimester: Secondary | ICD-10-CM

## 2012-05-26 DIAGNOSIS — O26819 Pregnancy related exhaustion and fatigue, unspecified trimester: Secondary | ICD-10-CM

## 2012-05-26 DIAGNOSIS — O34219 Maternal care for unspecified type scar from previous cesarean delivery: Secondary | ICD-10-CM | POA: Insufficient documentation

## 2012-05-26 MED ORDER — ONDANSETRON HCL 4 MG PO TABS: 4.0000 mg | ORAL_TABLET | Freq: Three times a day (TID) | ORAL | Status: DC | PRN

## 2012-05-26 NOTE — Progress Notes (Signed)
[redacted]w[redacted]d C/o feeling tired - check tsh and vit D Encouraged inc water AFP in 2-3 wks lab visit only ROB and anatomy scan in 4wks Refill zofran per pt request Consent form for repeat c/s - h/o c/s - please ask at NV whether pt is planning BTL

## 2012-05-27 ENCOUNTER — Other Ambulatory Visit: Payer: Self-pay | Admitting: Obstetrics and Gynecology

## 2012-05-27 LAB — VITAMIN D 25 HYDROXY (VIT D DEFICIENCY, FRACTURES): Vit D, 25-Hydroxy: 31 ng/mL (ref 30–89)

## 2012-05-27 LAB — TSH: TSH: 2.782 u[IU]/mL (ref 0.350–4.500)

## 2012-05-31 ENCOUNTER — Telehealth: Payer: Self-pay | Admitting: Obstetrics and Gynecology

## 2012-05-31 NOTE — Telephone Encounter (Signed)
Pt is15 wk gestation, complaining of severe headache x 2 days. Pt also complained of blurred vision, hot feeling in both ears on last pm, and dizziness.  Symptoms are not better with Tylenol,  Pt was advised per CNM V L to take Ibuprofen 600mg  every 6hrs for a course of 24 hrs. Pt is to call the office with any changes or if symptoms don't subside. Pt voice understanding. Angela [name]

## 2012-06-01 ENCOUNTER — Telehealth: Payer: Self-pay | Admitting: Obstetrics and Gynecology

## 2012-06-01 ENCOUNTER — Ambulatory Visit: Payer: Medicaid Other | Admitting: Obstetrics and Gynecology

## 2012-06-01 VITALS — BP 112/62 | Wt 164.0 lb

## 2012-06-01 DIAGNOSIS — Z139 Encounter for screening, unspecified: Secondary | ICD-10-CM

## 2012-06-01 DIAGNOSIS — N949 Unspecified condition associated with female genital organs and menstrual cycle: Secondary | ICD-10-CM

## 2012-06-01 DIAGNOSIS — Z124 Encounter for screening for malignant neoplasm of cervix: Secondary | ICD-10-CM

## 2012-06-01 DIAGNOSIS — R300 Dysuria: Secondary | ICD-10-CM

## 2012-06-01 LAB — POCT URINALYSIS DIPSTICK
Ketones, UA: NEGATIVE
Leukocytes, UA: NEGATIVE
Nitrite, UA: NEGATIVE
Urobilinogen, UA: NEGATIVE
pH, UA: 7

## 2012-06-01 MED ORDER — CYCLOBENZAPRINE HCL 10 MG PO TABS: 10.0000 mg | ORAL_TABLET | Freq: Three times a day (TID) | ORAL | Status: DC | PRN

## 2012-06-01 NOTE — Telephone Encounter (Signed)
Tc to pt, saw VL earlier today for appt. Pt was prescribed flexeril and has already been taking phenergan. Pt does not think she will be able to make it to work in the morning due to side effects of medication. Ok per VL to fax out of work note to [employer] job. Work note will be sent by TG this afternoon. Pt agreeable.

## 2012-06-01 NOTE — Telephone Encounter (Signed)
Tc to pt per telephone call. Pt with c/o abd pain and pressure with urination. No vag bldg. Pt admits to inadequate water intake. Informed pt to inc water intake(64oz daily). Appt sched today for eval @ 2:45 with VL for eval. Pt agrees.

## 2012-06-01 NOTE — Progress Notes (Signed)
[redacted]w[redacted]d Pt presents with pelvic and rectal pressure.  Denies bleeding or UCs Cx: Closed, thick, long Wet Prep: neg Cultures sent Pap done today Rx: Flexeril 10mg  q 8hrs PRN for pain

## 2012-06-01 NOTE — Progress Notes (Signed)
[redacted]w[redacted]d Pt complains of pain and pressure in vaginal and rectal area starting this am. Symptoms worse with voiding.

## 2012-06-02 LAB — PAP IG W/ RFLX HPV ASCU

## 2012-06-13 ENCOUNTER — Other Ambulatory Visit: Payer: Medicaid Other

## 2012-06-13 DIAGNOSIS — Z331 Pregnant state, incidental: Secondary | ICD-10-CM

## 2012-06-16 LAB — ALPHA FETOPROTEIN, MATERNAL
AFP: 42.1 IU/mL
Curr Gest Age: 16.6 wks.days
Osb Risk: 1:14800 {titer}

## 2012-06-21 ENCOUNTER — Ambulatory Visit: Payer: Medicaid Other | Admitting: Obstetrics and Gynecology

## 2012-06-21 ENCOUNTER — Other Ambulatory Visit: Payer: Self-pay | Admitting: Obstetrics and Gynecology

## 2012-06-21 ENCOUNTER — Encounter: Payer: Self-pay | Admitting: Obstetrics and Gynecology

## 2012-06-21 ENCOUNTER — Ambulatory Visit: Payer: Medicaid Other

## 2012-06-21 VITALS — BP 106/56 | Wt 167.0 lb

## 2012-06-21 DIAGNOSIS — O34219 Maternal care for unspecified type scar from previous cesarean delivery: Secondary | ICD-10-CM

## 2012-06-21 DIAGNOSIS — Z3480 Encounter for supervision of other normal pregnancy, unspecified trimester: Secondary | ICD-10-CM

## 2012-06-21 DIAGNOSIS — Z1389 Encounter for screening for other disorder: Secondary | ICD-10-CM

## 2012-06-21 NOTE — Progress Notes (Signed)
[redacted]w[redacted]d U/s today - no anomalies but not all heart views seen, cvx 3.9cm, nl fluid, post placenta, 3v cord, repeat u/s in 2-3 wks to f/u limited anatomy No complaints Enc inc water RTO 2-3wks

## 2012-06-30 LAB — US OB COMP + 14 WK

## 2012-07-05 ENCOUNTER — Encounter: Payer: Self-pay | Admitting: Obstetrics and Gynecology

## 2012-07-05 ENCOUNTER — Ambulatory Visit: Payer: Medicaid Other | Admitting: Obstetrics and Gynecology

## 2012-07-05 ENCOUNTER — Ambulatory Visit: Payer: Medicaid Other

## 2012-07-05 VITALS — BP 108/56 | Wt 169.0 lb

## 2012-07-05 DIAGNOSIS — Z3480 Encounter for supervision of other normal pregnancy, unspecified trimester: Secondary | ICD-10-CM

## 2012-07-05 DIAGNOSIS — Z331 Pregnant state, incidental: Secondary | ICD-10-CM

## 2012-07-05 NOTE — Progress Notes (Signed)
[redacted]w[redacted]d Pt has no complaints

## 2012-07-05 NOTE — Progress Notes (Signed)
Ultrasound shows:  SIUP  S=D     [place] EDD: 11/22/2012           EFW: 11 oz           AFI: n/a           Cervical length: 3.86 cm           Placenta localization: posterior           Fetal presentation: vertex Comment: Vertical Pocket = 4.8 cm, ductal arch, aortic arch, RVOT, LVOT is seen today. Normal ovaries, no fluid in CDS, normal adnexa. Cervix is closed

## 2012-07-05 NOTE — Progress Notes (Signed)
[redacted]w[redacted]d  [facility] Ultrasound to complete anatomy today confirmed all normal anatomy BTL reviewed, procedure R&B. Patient to sign tubal papers.

## 2012-07-06 ENCOUNTER — Telehealth: Payer: Self-pay | Admitting: Obstetrics and Gynecology

## 2012-07-06 NOTE — Telephone Encounter (Signed)
Returned pt's call.  LM to return call in am or call after hours, for which instructions were given.

## 2012-07-12 LAB — US OB FOLLOW UP

## 2012-07-19 ENCOUNTER — Other Ambulatory Visit: Payer: Self-pay | Admitting: Obstetrics and Gynecology

## 2012-07-19 ENCOUNTER — Telehealth: Payer: Self-pay | Admitting: Obstetrics and Gynecology

## 2012-07-19 MED ORDER — PROMETHAZINE HCL 25 MG PO TABS: 25.0000 mg | ORAL_TABLET | Freq: Four times a day (QID) | ORAL | Status: DC | PRN

## 2012-07-19 NOTE — Telephone Encounter (Signed)
TC to pt. States is taking Phenergan q hs for nausea and requests Rf. Will consult provider and order when approved. Pt denies any other problems.

## 2012-07-19 NOTE — Telephone Encounter (Signed)
Returned pt's call. LM to return call.   

## 2012-07-28 ENCOUNTER — Encounter: Payer: Medicaid Other | Admitting: Obstetrics and Gynecology

## 2012-10-24 ENCOUNTER — Other Ambulatory Visit: Payer: Self-pay | Admitting: Obstetrics and Gynecology

## 2012-10-26 NOTE — Addendum Note (Signed)
Addended by: Janine Limbo on: 10/26/2012 12:17 PM   Modules accepted: Orders

## 2012-11-04 ENCOUNTER — Encounter (HOSPITAL_COMMUNITY): Payer: Self-pay | Admitting: Pharmacist

## 2012-11-07 ENCOUNTER — Encounter (HOSPITAL_COMMUNITY): Payer: Self-pay

## 2012-11-07 ENCOUNTER — Inpatient Hospital Stay (HOSPITAL_COMMUNITY)
Admission: AD | Admit: 2012-11-07 | Discharge: 2012-11-07 | Disposition: A | Discharge status: Home / Self Care | Payer: BC Managed Care – PPO | Source: Ambulatory Visit | Attending: Obstetrics and Gynecology | Admitting: Obstetrics and Gynecology

## 2012-11-07 DIAGNOSIS — O471 False labor at or after 37 completed weeks of gestation: Secondary | ICD-10-CM

## 2012-11-07 DIAGNOSIS — N949 Unspecified condition associated with female genital organs and menstrual cycle: Secondary | ICD-10-CM | POA: Insufficient documentation

## 2012-11-07 DIAGNOSIS — O34219 Maternal care for unspecified type scar from previous cesarean delivery: Secondary | ICD-10-CM | POA: Insufficient documentation

## 2012-11-07 DIAGNOSIS — O99891 Other specified diseases and conditions complicating pregnancy: Secondary | ICD-10-CM | POA: Insufficient documentation

## 2012-11-07 NOTE — MAU Provider Note (Signed)
History   27yo, [id number] at [redacted]w[redacted]d with a scheduled repeat c/s on 7/8.  Pt presented with increasing vaginal pressure.  Denies VB, UCs, LOF, recent fever, resp or GI c/o's, UTI or PIH s/s. GFM.   Chief Complaint  Patient presents with  . Labor Eval    OB History   Grav Para Term Preterm Abortions TAB SAB Ect Mult Living   3 2 1 1      2      Obstetric Comments   2005 HYPEREMESIS 2010 MECONIUM ASPIRATION      Past Medical History  Diagnosis Date  . Ovarian cyst rupture   . GERD (gastroesophageal reflux disease) AGE 73    NO MEDS  . Abnormal Pap smear AGE 74    COLPO LAST PAP 08/2011  . Preterm labor   . Infection AGE 44    CHLAMYDIA; GONORRHEA  . 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 Infection     BV  . Complication of anesthesia     NAUSEA; VOMITING; ITCHING; DIFFICULTY BREATHING    Past Surgical History  Procedure Laterality Date  . Cervix lesion destruction    . Wisdom tooth extraction    . Cesarean section      x2  . Colposcopy      Family History  Problem Relation Age of Onset  . Anesthesia problems Mother     NAUSEA AND VOMITING  . Thyroid disease Mother   . Fibromyalgia Mother   . Sarcoidosis Mother   . Depression Mother   . Other Mother     VARICOSE VEINS  . Anesthesia problems Other   . Diabetes Father   . Hypertension Sister   . Cancer Maternal Grandfather     prostate  . Depression Maternal Aunt   . Sarcoidosis Maternal Aunt   . Arthritis Maternal Grandmother   . Heart disease Maternal Grandmother   . Hypertension Maternal Grandmother   . Kidney disease Maternal Grandmother     FAILURE  . Asthma Cousin     History  Substance Use Topics  . Smoking status: Never Smoker   . Smokeless tobacco: Never Used  . Alcohol Use: 1.8 oz/week    2 Glasses of wine, 1 Shots of liquor per week     Comment: weekends only; NONE SINCE PREGNAN T    Allergies:  Allergies  Allergen Reactions  . Amoxicillin Hives    Prescriptions prior to admission  Medication  Sig Dispense Refill  . acetaminophen (TYLENOL) 500 MG tablet Take 500 mg by mouth every 6 (six) hours as needed for pain.      Marland Kitchen ondansetron (ZOFRAN) 4 MG tablet Take 1 tablet (4 mg total) by mouth every 8 (eight) hours as needed for nausea.  30 tablet  0  . Prenatal Vit-Fe Fumarate-FA (PRENATAL MULTIVITAMIN) TABS Take 1 tablet by mouth daily.      . promethazine (PHENERGAN) 25 MG tablet Take 1 tablet (25 mg total) by mouth every 6 (six) hours as needed for nausea.  36 tablet  1    ROS: see HPI above, all other systems are negative   Physical Exam   Blood pressure 120/71, pulse 100, temperature 99.3 F (37.4 C), temperature source Oral, resp. rate 16, height 5\' 3"  (1.6 m), weight 180 lb 6.4 oz (81.829 kg), last menstrual period 02/16/2012, SpO2 100.00%.  Chest: Clear Heart: RRR Abdomen: gravid, NT Extremities: WNL  Pelvic: closed / 50-60% / -2 / no VB noted  FHT: Reactive NST UCs:  none  ED Course  IUP at [redacted]w[redacted]d Labor check Repeat c/s scheduled 7/8  D/c home with precautions  Keep scheduled appointments 11/10/12    Haroldine Laws CNM, MSN 11/07/2012 1:38 PM

## 2012-11-07 NOTE — MAU Note (Signed)
Patient is scheduled for a repeat cesarean section on 7-8.

## 2012-11-07 NOTE — MAU Note (Signed)
Patient states she is having a lot of lower and vaginal pressure and cramping. Denies bleeding or leaking and reports good fetal movement.

## 2012-11-14 ENCOUNTER — Encounter (HOSPITAL_COMMUNITY): Payer: Self-pay

## 2012-11-14 ENCOUNTER — Encounter (HOSPITAL_COMMUNITY)
Admission: RE | Admit: 2012-11-14 | Discharge: 2012-11-14 | Disposition: A | Discharge status: Home / Self Care | Payer: Medicaid Other | Source: Ambulatory Visit | Attending: Obstetrics and Gynecology | Admitting: Obstetrics and Gynecology

## 2012-11-14 VITALS — BP 114/71 | HR 96 | Resp 18 | Ht 63.0 in | Wt 180.0 lb

## 2012-11-14 DIAGNOSIS — O34219 Maternal care for unspecified type scar from previous cesarean delivery: Secondary | ICD-10-CM

## 2012-11-14 HISTORY — DX: Other specified postprocedural states: Z98.890

## 2012-11-14 HISTORY — DX: Other specified postprocedural states: R11.2

## 2012-11-14 HISTORY — DX: Anemia, unspecified: D64.9

## 2012-11-14 LAB — CBC
HCT: 34.2 % — ABNORMAL LOW (ref 36.0–46.0)
MCV: 88.4 fL (ref 78.0–100.0)
Platelets: 255 10*3/uL (ref 150–400)
RBC: 3.87 MIL/uL (ref 3.87–5.11)
RDW: 14.3 % (ref 11.5–15.5)
WBC: 7.2 10*3/uL (ref 4.0–10.5)

## 2012-11-14 LAB — ABO/RH: ABO/RH(D): B POS

## 2012-11-14 MED ORDER — GENTAMICIN SULFATE 40 MG/ML IJ SOLN
INTRAMUSCULAR | Status: AC
  Administered 2012-11-15: 200 mL via INTRAVENOUS
  Filled 2012-11-14: qty 10

## 2012-11-14 NOTE — H&P (Signed)
Admission History and Physical Exam for an Obstetrics Patient  [name] is a 27 y.o. female, G3P0202, at [redacted] weeks gestation, who presents for repeat cesarean section and tubal sterilization.. She has been followed at the [facility] and Gynecology division of [facility].  Her pregnancy has been complicated by a history of 2 deliveries at [redacted] weeks gestation.  She has had 2 prior cesarean sections.  She desires sterilization.  Her beta strep test is positive. See history below.  OB History   Grav Para Term Preterm Abortions TAB SAB Ect Mult Living   3 2 1 1      2      Obstetric Comments   2005 HYPEREMESIS 2010 MECONIUM ASPIRATION      Past Medical History  Diagnosis Date  . Ovarian cyst rupture   . Abnormal Pap smear AGE 69    COLPO LAST PAP 08/2011  . Preterm labor   . Infection AGE 69    CHLAMYDIA; GONORRHEA  . 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 Infection     BV  . Complication of anesthesia     NAUSEA; VOMITING; ITCHING; DIFFICULTY BREATHING  . PONV (postoperative nausea and vomiting)   . Anemia   . GERD (gastroesophageal reflux disease) AGE 37    tums prn    No prescriptions prior to admission    Past Surgical History  Procedure Laterality Date  . Cervix lesion destruction    . Wisdom tooth extraction    . Cesarean section  2005, 2010    x2  . Colposcopy      Allergies  Allergen Reactions  . Amoxicillin Hives    Family History: family history includes Anesthesia problems in her mother and other; Arthritis in her maternal grandmother; Asthma in her cousin; Cancer in her maternal grandfather; Depression in her maternal aunt and mother; Diabetes in her father; Fibromyalgia in her mother; Heart disease in her maternal grandmother; Hypertension in her maternal grandmother and sister; Kidney disease in her maternal grandmother; Other in her mother; Sarcoidosis in her maternal aunt and mother; and Thyroid disease in her  mother.  Social History:  reports that she has never smoked. She has never used smokeless tobacco. She reports that she drinks about 1.8 ounces of alcohol per week. She reports that she does not use illicit drugs.  Review of systems: Normal pregnancy complaints.  Admission Physical Exam:    Body mass index is 31.89 kg/(m^2).  Weight 180 lb (81.647 kg), last menstrual period 02/16/2012.  HEENT:                 Within normal limits Chest:                   Clear Heart:                    Regular rate and rhythm Abdomen:             Gravid and nontender Extremities:          Grossly normal Neurologic exam: Grossly normal Pelvic exam:         Cervix: closed and long when last checked  Prenatal labs: ABO, Rh:             --/--/B POS (07/07 1151) HBsAg:                 NEGATIVE (12/03 1134)  HIV:  NON REACTIVE (12/03 1134)  GBS:                      positive Antibody:              NEG (07/07 1151) Rubella:                  immune RPR:                    NON REACTIVE (07/07 1151)      Prenatal Transfer Tool  Maternal Diabetes: No Genetic Screening: late entry into prenatal care Maternal Ultrasounds/Referrals: Normal Fetal Ultrasounds or other Referrals:  None Maternal Substance Abuse:  No Significant Maternal Medications:  Meds include: Progesterone Other: Valtrex Significant Maternal Lab Results:  None Other Comments:  positive beta strep  Assessment:  [redacted] weeks gestation  Prior cesarean section x2  Desires repeat cesarean section  Desires tubal sterilization procedure  Herpes simplex virus  Plan:  Repeat cesarean section and tubal sterilization procedure.   Kennie Snedden V 11/14/2012, 7:16 PM

## 2012-11-14 NOTE — Patient Instructions (Addendum)
   Your procedure is scheduled on: Tuesday, July 8th   Enter through the Main Entrance of [facility] at: 945am Pick up the phone at the desk and dial [phone] and inform us of your arrival.  Please call this number if you have any problems the morning of surgery: [phone]  Remember: Do not eat or drink after midnight: Monday Do not drink clear liquids after: Take these medicines the morning of surgery with a SIP OF WATER: none but can take zofran if needed on day of surgery.  Do not wear jewelry, make-up, or FINGER nail polish No metal in your hair or on your body. Do not wear lotions, powders, perfumes. You may wear deodorant.  Please use your CHG wash as directed prior to surgery.  Do not shave anywhere for at least 12 hours prior to first CHG shower.  Do not bring valuables to the hospital. Contacts, dentures or bridgework may not be worn into surgery.  Leave suitcase in the car. After Surgery it may be brought to your room. For patients being admitted to the hospital, checkout time is 11:00am the day of discharge.  Home with husband Haywood Pao.

## 2012-11-15 ENCOUNTER — Inpatient Hospital Stay (HOSPITAL_COMMUNITY): Payer: Medicaid Other | Admitting: Anesthesiology

## 2012-11-15 ENCOUNTER — Encounter (HOSPITAL_COMMUNITY): Payer: Self-pay | Admitting: Anesthesiology

## 2012-11-15 ENCOUNTER — Inpatient Hospital Stay (HOSPITAL_COMMUNITY)
Admission: AD | Admit: 2012-11-15 | Discharge: 2012-11-17 | DRG: 765 | Disposition: A | Discharge status: Home / Self Care | Payer: Medicaid Other | Source: Ambulatory Visit | Attending: Obstetrics and Gynecology | Admitting: Obstetrics and Gynecology

## 2012-11-15 ENCOUNTER — Encounter (HOSPITAL_COMMUNITY)
Admission: AD | Disposition: A | Discharge status: Home / Self Care | Payer: Self-pay | Source: Ambulatory Visit | Attending: Obstetrics and Gynecology

## 2012-11-15 DIAGNOSIS — Z22330 Carrier of Group B streptococcus: Secondary | ICD-10-CM

## 2012-11-15 DIAGNOSIS — O34219 Maternal care for unspecified type scar from previous cesarean delivery: Principal | ICD-10-CM

## 2012-11-15 DIAGNOSIS — Z9851 Tubal ligation status: Secondary | ICD-10-CM

## 2012-11-15 DIAGNOSIS — Z302 Encounter for sterilization: Secondary | ICD-10-CM

## 2012-11-15 DIAGNOSIS — O99892 Other specified diseases and conditions complicating childbirth: Secondary | ICD-10-CM | POA: Diagnosis present

## 2012-11-15 DIAGNOSIS — A6000 Herpesviral infection of urogenital system, unspecified: Secondary | ICD-10-CM | POA: Diagnosis present

## 2012-11-15 DIAGNOSIS — Z789 Other specified health status: Secondary | ICD-10-CM | POA: Diagnosis not present

## 2012-11-15 DIAGNOSIS — O98519 Other viral diseases complicating pregnancy, unspecified trimester: Secondary | ICD-10-CM | POA: Diagnosis present

## 2012-11-15 HISTORY — PX: UNILATERAL SALPINGECTOMY: SHX6160

## 2012-11-15 LAB — TYPE AND SCREEN: ABO/RH(D): B POS

## 2012-11-15 SURGERY — Surgical Case
Anesthesia: Spinal | Site: Abdomen | Laterality: Right | Wound class: Clean Contaminated

## 2012-11-15 MED ORDER — LACTATED RINGERS IV SOLN
INTRAVENOUS | Status: DC
  Administered 2012-11-15: 20:00:00 via INTRAVENOUS

## 2012-11-15 MED ORDER — MORPHINE SULFATE 0.5 MG/ML IJ SOLN
INTRAMUSCULAR | Status: AC
  Filled 2012-11-15: qty 10

## 2012-11-15 MED ORDER — ONDANSETRON HCL 4 MG PO TABS: 4.0000 mg | ORAL_TABLET | ORAL | Status: DC | PRN

## 2012-11-15 MED ORDER — KETOROLAC TROMETHAMINE 30 MG/ML IJ SOLN: 30.0000 mg | Freq: Four times a day (QID) | INTRAMUSCULAR | Status: AC | PRN

## 2012-11-15 MED ORDER — DIPHENHYDRAMINE HCL 25 MG PO CAPS: 25.0000 mg | ORAL_CAPSULE | ORAL | Status: DC | PRN

## 2012-11-15 MED ORDER — LACTATED RINGERS IV SOLN
INTRAVENOUS | Status: DC | PRN
  Administered 2012-11-15: 12:00:00 via INTRAVENOUS

## 2012-11-15 MED ORDER — DIPHENHYDRAMINE HCL 25 MG PO CAPS: 25.0000 mg | ORAL_CAPSULE | Freq: Four times a day (QID) | ORAL | Status: DC | PRN

## 2012-11-15 MED ORDER — PHENYLEPHRINE HCL 10 MG/ML IJ SOLN
INTRAMUSCULAR | Status: DC | PRN
  Administered 2012-11-15 (×3): 40 ug via INTRAVENOUS
  Administered 2012-11-15: 80 ug via INTRAVENOUS
  Administered 2012-11-15 (×3): 40 ug via INTRAVENOUS

## 2012-11-15 MED ORDER — ONDANSETRON HCL 4 MG/2ML IJ SOLN
INTRAMUSCULAR | Status: DC | PRN
  Administered 2012-11-15: 4 mg via INTRAVENOUS

## 2012-11-15 MED ORDER — SODIUM CHLORIDE 0.9 % IJ SOLN: 3.0000 mL | INTRAMUSCULAR | Status: DC | PRN

## 2012-11-15 MED ORDER — OXYTOCIN 40 UNITS IN LACTATED RINGERS INFUSION - SIMPLE MED: 62.5000 mL/h | INTRAVENOUS | Status: AC

## 2012-11-15 MED ORDER — WITCH HAZEL-GLYCERIN EX PADS: 1.0000 "application " | MEDICATED_PAD | CUTANEOUS | Status: DC | PRN

## 2012-11-15 MED ORDER — ZOLPIDEM TARTRATE 5 MG PO TABS: 5.0000 mg | ORAL_TABLET | Freq: Every evening | ORAL | Status: DC | PRN

## 2012-11-15 MED ORDER — NALOXONE HCL 0.4 MG/ML IJ SOLN: 0.4000 mg | INTRAMUSCULAR | Status: DC | PRN

## 2012-11-15 MED ORDER — BUPIVACAINE IN DEXTROSE 0.75-8.25 % IT SOLN
INTRATHECAL | Status: DC | PRN
  Administered 2012-11-15: 1.3 mL via INTRATHECAL

## 2012-11-15 MED ORDER — SENNOSIDES-DOCUSATE SODIUM 8.6-50 MG PO TABS
2.0000 | ORAL_TABLET | Freq: Every day | ORAL | Status: DC
  Administered 2012-11-15 – 2012-11-16 (×2): 2 via ORAL

## 2012-11-15 MED ORDER — NALBUPHINE HCL 10 MG/ML IJ SOLN: 5.0000 mg | INTRAMUSCULAR | Status: DC | PRN

## 2012-11-15 MED ORDER — BUPIVACAINE-EPINEPHRINE (PF) 0.5% -1:200000 IJ SOLN
INTRAMUSCULAR | Status: AC
  Filled 2012-11-15: qty 10

## 2012-11-15 MED ORDER — SIMETHICONE 80 MG PO CHEW: 80.0000 mg | CHEWABLE_TABLET | ORAL | Status: DC | PRN

## 2012-11-15 MED ORDER — 0.9 % SODIUM CHLORIDE (POUR BTL) OPTIME
TOPICAL | Status: DC | PRN
  Administered 2012-11-15: 1000 mL

## 2012-11-15 MED ORDER — ONDANSETRON HCL 4 MG/2ML IJ SOLN
INTRAMUSCULAR | Status: AC
  Filled 2012-11-15: qty 2

## 2012-11-15 MED ORDER — SCOPOLAMINE 1 MG/3DAYS TD PT72
MEDICATED_PATCH | TRANSDERMAL | Status: AC
  Administered 2012-11-15: 1.5 mg via TRANSDERMAL
  Filled 2012-11-15: qty 1

## 2012-11-15 MED ORDER — SIMETHICONE 80 MG PO CHEW
80.0000 mg | CHEWABLE_TABLET | Freq: Three times a day (TID) | ORAL | Status: DC
  Administered 2012-11-15 – 2012-11-16 (×5): 80 mg via ORAL

## 2012-11-15 MED ORDER — KETOROLAC TROMETHAMINE 60 MG/2ML IM SOLN: 60.0000 mg | Freq: Once | INTRAMUSCULAR | Status: AC | PRN

## 2012-11-15 MED ORDER — PRENATAL MULTIVITAMIN CH
1.0000 | ORAL_TABLET | Freq: Every day | ORAL | Status: DC
  Administered 2012-11-16: 1 via ORAL

## 2012-11-15 MED ORDER — DIPHENHYDRAMINE HCL 50 MG/ML IJ SOLN: 25.0000 mg | INTRAMUSCULAR | Status: DC | PRN

## 2012-11-15 MED ORDER — MEASLES, MUMPS & RUBELLA VAC ~~LOC~~ INJ
0.5000 mL | INJECTION | Freq: Once | SUBCUTANEOUS | Status: DC
  Filled 2012-11-15: qty 0.5

## 2012-11-15 MED ORDER — DIPHENHYDRAMINE HCL 50 MG/ML IJ SOLN: 12.5000 mg | INTRAMUSCULAR | Status: DC | PRN

## 2012-11-15 MED ORDER — LANOLIN HYDROUS EX OINT: 1.0000 "application " | TOPICAL_OINTMENT | CUTANEOUS | Status: DC | PRN

## 2012-11-15 MED ORDER — DIBUCAINE 1 % RE OINT: 1.0000 "application " | TOPICAL_OINTMENT | RECTAL | Status: DC | PRN

## 2012-11-15 MED ORDER — FENTANYL CITRATE 0.05 MG/ML IJ SOLN
INTRAMUSCULAR | Status: AC
  Filled 2012-11-15: qty 2

## 2012-11-15 MED ORDER — MORPHINE SULFATE (PF) 0.5 MG/ML IJ SOLN
INTRAMUSCULAR | Status: DC | PRN
  Administered 2012-11-15: .1 mg via INTRATHECAL

## 2012-11-15 MED ORDER — SCOPOLAMINE 1 MG/3DAYS TD PT72: 1.0000 | MEDICATED_PATCH | Freq: Once | TRANSDERMAL | Status: DC

## 2012-11-15 MED ORDER — FENTANYL CITRATE 0.05 MG/ML IJ SOLN
INTRAMUSCULAR | Status: DC | PRN
  Administered 2012-11-15: 15 ug via INTRATHECAL

## 2012-11-15 MED ORDER — OXYTOCIN 10 UNIT/ML IJ SOLN
INTRAMUSCULAR | Status: DC | PRN
  Administered 2012-11-15: 40 [IU] via INTRAMUSCULAR

## 2012-11-15 MED ORDER — FENTANYL CITRATE 0.05 MG/ML IJ SOLN: 25.0000 ug | INTRAMUSCULAR | Status: DC | PRN

## 2012-11-15 MED ORDER — OXYTOCIN 10 UNIT/ML IJ SOLN
INTRAMUSCULAR | Status: AC
  Filled 2012-11-15: qty 4

## 2012-11-15 MED ORDER — MENTHOL 3 MG MT LOZG: 1.0000 | LOZENGE | OROMUCOSAL | Status: DC | PRN

## 2012-11-15 MED ORDER — MEDROXYPROGESTERONE ACETATE 150 MG/ML IM SUSP: 150.0000 mg | INTRAMUSCULAR | Status: DC | PRN

## 2012-11-15 MED ORDER — METOCLOPRAMIDE HCL 5 MG/ML IJ SOLN: 10.0000 mg | Freq: Three times a day (TID) | INTRAMUSCULAR | Status: DC | PRN

## 2012-11-15 MED ORDER — KETOROLAC TROMETHAMINE 30 MG/ML IJ SOLN
30.0000 mg | Freq: Four times a day (QID) | INTRAMUSCULAR | Status: AC | PRN
  Administered 2012-11-15: 30 mg via INTRAVENOUS
  Filled 2012-11-15: qty 1

## 2012-11-15 MED ORDER — BUPIVACAINE-EPINEPHRINE 0.5% -1:200000 IJ SOLN
INTRAMUSCULAR | Status: DC | PRN
  Administered 2012-11-15: 10 mL

## 2012-11-15 MED ORDER — OXYCODONE-ACETAMINOPHEN 5-325 MG PO TABS
1.0000 | ORAL_TABLET | ORAL | Status: DC | PRN
  Administered 2012-11-16 (×2): 1 via ORAL
  Administered 2012-11-16: 2 via ORAL
  Administered 2012-11-16 (×2): 1 via ORAL
  Administered 2012-11-17 (×2): 2 via ORAL
  Filled 2012-11-15: qty 1
  Filled 2012-11-15: qty 2
  Filled 2012-11-15: qty 1
  Filled 2012-11-15: qty 2
  Filled 2012-11-15 (×4): qty 1

## 2012-11-15 MED ORDER — IBUPROFEN 600 MG PO TABS
600.0000 mg | ORAL_TABLET | Freq: Four times a day (QID) | ORAL | Status: DC
  Administered 2012-11-16 – 2012-11-17 (×5): 600 mg via ORAL
  Filled 2012-11-15 (×5): qty 1

## 2012-11-15 MED ORDER — PRENATAL MULTIVITAMIN CH
1.0000 | ORAL_TABLET | Freq: Every day | ORAL | Status: DC
  Filled 2012-11-15: qty 1

## 2012-11-15 MED ORDER — KETOROLAC TROMETHAMINE 60 MG/2ML IM SOLN
INTRAMUSCULAR | Status: AC
  Administered 2012-11-15: 60 mg via INTRAMUSCULAR
  Filled 2012-11-15: qty 2

## 2012-11-15 MED ORDER — LACTATED RINGERS IV SOLN
INTRAVENOUS | Status: DC
  Administered 2012-11-15 (×4): via INTRAVENOUS

## 2012-11-15 MED ORDER — ONDANSETRON HCL 4 MG/2ML IJ SOLN
4.0000 mg | INTRAMUSCULAR | Status: DC | PRN
  Administered 2012-11-15: 4 mg via INTRAVENOUS
  Filled 2012-11-15: qty 2

## 2012-11-15 MED ORDER — ONDANSETRON HCL 4 MG/2ML IJ SOLN: 4.0000 mg | Freq: Three times a day (TID) | INTRAMUSCULAR | Status: DC | PRN

## 2012-11-15 MED ORDER — TETANUS-DIPHTH-ACELL PERTUSSIS 5-2.5-18.5 LF-MCG/0.5 IM SUSP
0.5000 mL | Freq: Once | INTRAMUSCULAR | Status: AC
  Administered 2012-11-16: 0.5 mL via INTRAMUSCULAR
  Filled 2012-11-15: qty 0.5

## 2012-11-15 MED ORDER — NALOXONE HCL 1 MG/ML IJ SOLN: 1.0000 ug/kg/h | INTRAVENOUS | Status: DC | PRN

## 2012-11-15 MED ORDER — MEPERIDINE HCL 25 MG/ML IJ SOLN: 6.2500 mg | INTRAMUSCULAR | Status: DC | PRN

## 2012-11-15 SURGICAL SUPPLY — 37 items
CLAMP CORD UMBIL (MISCELLANEOUS) IMPLANT
CLOTH BEACON ORANGE TIMEOUT ST (SAFETY) ×3 IMPLANT
CONTAINER PREFILL 10% NBF 15ML (MISCELLANEOUS) IMPLANT
DRAIN JACKSON PRT FLT 7MM (DRAIN) IMPLANT
DRAPE LG THREE QUARTER DISP (DRAPES) ×3 IMPLANT
DRSG OPSITE POSTOP 4X10 (GAUZE/BANDAGES/DRESSINGS) ×3 IMPLANT
DURAPREP 26ML APPLICATOR (WOUND CARE) ×3 IMPLANT
ELECT REM PT RETURN 9FT ADLT (ELECTROSURGICAL) ×3
ELECTRODE REM PT RTRN 9FT ADLT (ELECTROSURGICAL) ×2 IMPLANT
EVACUATOR SILICONE 100CC (DRAIN) IMPLANT
EXTRACTOR VACUUM M CUP 4 TUBE (SUCTIONS) IMPLANT
GLOVE BIOGEL PI IND STRL 8.5 (GLOVE) ×2 IMPLANT
GLOVE BIOGEL PI INDICATOR 8.5 (GLOVE) ×1
GLOVE ECLIPSE 8.0 STRL XLNG CF (GLOVE) ×6 IMPLANT
GOWN PREVENTION PLUS XXLARGE (GOWN DISPOSABLE) ×3 IMPLANT
GOWN STRL REIN XL XLG (GOWN DISPOSABLE) ×6 IMPLANT
KIT ABG SYR 3ML LUER SLIP (SYRINGE) IMPLANT
NEEDLE HYPO 25X1 1.5 SAFETY (NEEDLE) ×3 IMPLANT
NEEDLE HYPO 25X5/8 SAFETYGLIDE (NEEDLE) IMPLANT
PACK C SECTION WH (CUSTOM PROCEDURE TRAY) ×3 IMPLANT
PAD OB MATERNITY 4.3X12.25 (PERSONAL CARE ITEMS) ×3 IMPLANT
RINGERS IRRIG 1000ML POUR BTL (IV SOLUTION) ×3 IMPLANT
STAPLER VISISTAT 35W (STAPLE) IMPLANT
SUT MNCRL AB 3-0 PS2 27 (SUTURE) ×3 IMPLANT
SUT PLAIN 0 NONE (SUTURE) IMPLANT
SUT SILK 3 0 FS 1X18 (SUTURE) IMPLANT
SUT VIC AB 0 CT1 27 (SUTURE) ×4
SUT VIC AB 0 CT1 27XBRD ANBCTR (SUTURE) ×4 IMPLANT
SUT VIC AB 2-0 CTX 36 (SUTURE) ×6 IMPLANT
SUT VIC AB 3-0 CT1 27 (SUTURE)
SUT VIC AB 3-0 CT1 TAPERPNT 27 (SUTURE) IMPLANT
SUT VIC AB 3-0 SH 27 (SUTURE)
SUT VIC AB 3-0 SH 27X BRD (SUTURE) IMPLANT
SYR CONTROL 10ML LL (SYRINGE) ×3 IMPLANT
TOWEL OR 17X24 6PK STRL BLUE (TOWEL DISPOSABLE) ×9 IMPLANT
TRAY FOLEY CATH 14FR (SET/KITS/TRAYS/PACK) ×3 IMPLANT
WATER STERILE IRR 1000ML POUR (IV SOLUTION) ×3 IMPLANT

## 2012-11-15 NOTE — Anesthesia Postprocedure Evaluation (Signed)
  Anesthesia Post-op Note  Patient: Angela [name]  Procedure(s) Performed: Procedure(s): REPEAT CESAREAN SECTION WITH BILATERAL TUBAL LIGATION (Bilateral) UNILATERAL SALPINGECTOMY (Right)  Patient Location: PACU and Mother/Baby  Anesthesia Type:Spinal  Level of Consciousness: awake, alert , oriented and patient cooperative  Airway and Oxygen Therapy: Patient Spontanous Breathing  Post-op Pain: none  Post-op Assessment: Post-op Vital signs reviewed, Patient's Cardiovascular Status Stable and Respiratory Function Stable  Post-op Vital Signs: Reviewed and stable  Complications: No apparent anesthesia complications

## 2012-11-15 NOTE — Transfer of Care (Signed)
Immediate Anesthesia Transfer of Care Note  Patient: Angela [name]  Procedure(s) Performed: Procedure(s): REPEAT CESAREAN SECTION WITH BILATERAL TUBAL LIGATION (Bilateral)  Patient Location: PACU  Anesthesia Type:Spinal  Level of Consciousness: awake, alert , oriented and patient cooperative  Airway & Oxygen Therapy: Patient Spontanous Breathing  Post-op Assessment: Report given to PACU RN and Post -op Vital signs reviewed and stable  Post vital signs: Reviewed and stable  Complications: No apparent anesthesia complications

## 2012-11-15 NOTE — Consult Note (Signed)
  Neonatology Note:   Attendance at C-section:    This is a repeat C/S of term infant.   The mother is a 27 year old G3P2 pos, GBS postive with an uncomplicated pregancy . ROM at delivery, fluid clear. Infant vigorous with good spontaneous cry and tone initially.  He remained cyanotic, respirations became slow and irregular at one minute of age. Infant bulb suctioned with minimal secretions.  Transcutaneous pulse oximetry at 3 minutes was 45%. Heart rate remained above 150. Breath sounds were equal bilaterally with scattered rhonchi. Blow by oxygen given with improvement in saturations and color to 97% at 7 minutes of age.  Oxygen was slowely removed and saturations decreased to 86%.  Chest PT administered after which oxygen saturations improved to 94% on room air.  Ap 7,8. Gross exam unremarkable. Infant showed to mother and then taken to CN for further monitoring under the care of Pediatrician.    Rosie Fate, RN, MSN, NNP-BC Ruben Gottron, MD (Attending Neonatologist)

## 2012-11-15 NOTE — Anesthesia Preprocedure Evaluation (Signed)
Anesthesia Evaluation  Patient identified by MRN, date of birth, ID band Patient awake    Reviewed: Allergy & Precautions, H&P , NPO status , Patient's Chart, lab work & pertinent test results, reviewed documented beta blocker date and time   History of Anesthesia Complications (+) PONV  Airway Mallampati: III TM Distance: >3 FB Neck ROM: full    Dental  (+) Teeth Intact   Pulmonary neg pulmonary ROS,  breath sounds clear to auscultation        Cardiovascular negative cardio ROS  Rhythm:regular Rate:Normal     Neuro/Psych negative neurological ROS  negative psych ROS   GI/Hepatic Neg liver ROS, GERD- (tums prn with pregnancy)  ,  Endo/Other  negative endocrine ROS  Renal/GU negative Renal ROS  negative genitourinary   Musculoskeletal   Abdominal   Peds  Hematology negative hematology ROS (+)   Anesthesia Other Findings   Reproductive/Obstetrics (+) Pregnancy (h/o c/s x2)                           Anesthesia Physical Anesthesia Plan  ASA: II  Anesthesia Plan: Spinal   Post-op Pain Management:    Induction:   Airway Management Planned:   Additional Equipment:   Intra-op Plan:   Post-operative Plan:   Informed Consent: I have reviewed the patients History and Physical, chart, labs and discussed the procedure including the risks, benefits and alternatives for the proposed anesthesia with the patient or authorized representative who has indicated his/her understanding and acceptance.     Plan Discussed with: Surgeon and CRNA  Anesthesia Plan Comments:         Anesthesia Quick Evaluation

## 2012-11-15 NOTE — Anesthesia Procedure Notes (Signed)
Spinal  Patient location during procedure: OR Start time: 11/15/2012 11:34 AM Staffing Performed by: other anesthesia staff  Preanesthetic Checklist Completed: patient identified, site marked, surgical consent, pre-op evaluation, timeout performed, IV checked, risks and benefits discussed and monitors and equipment checked Spinal Block Patient position: sitting Prep: site prepped and draped and DuraPrep Patient monitoring: heart rate, continuous pulse ox and blood pressure Approach: midline Location: L3-4 Injection technique: single-shot Needle Needle type: Pencan and Introducer  Needle gauge: 24 G Needle length: 9 cm Assessment Sensory level: T4 Additional Notes Clear free flow CSF on first attempt by SRNA.  No paresthesia.  Patient tolerated procedure well with no apparent complications.  Jasmine December, MD

## 2012-11-15 NOTE — H&P (Signed)
The patient was interviewed and examined today.  The previously documented history and physical examination was reviewed. There are no changes. The operative procedure was reviewed. The risks and benefits were outlined again. The specific risks include, but are not limited to, anesthetic complications, bleeding, infections, and possible damage to the surrounding organs. The patient's questions were answered.  We are ready to proceed as outlined. The likelihood of the patient achieving the goals of this procedure is very likely.   BP 115/70  Pulse 93  Temp(Src) 98.6 F (37 C) (Oral)  Resp 18  Wt 180 lb (81.647 kg)  BMI 31.89 kg/m2  SpO2 100%  LMP 02/16/2012  CBC    Component Value Date/Time   WBC 7.2 11/14/2012 1151   RBC 3.87 11/14/2012 1151   HGB 11.7* 11/14/2012 1151   HCT 34.2* 11/14/2012 1151   PLT 255 11/14/2012 1151   MCV 88.4 11/14/2012 1151   MCH 30.2 11/14/2012 1151   MCHC 34.2 11/14/2012 1151   RDW 14.3 11/14/2012 1151   LYMPHSABS 2.8 04/12/2012 1134   MONOABS 0.8 04/12/2012 1134   EOSABS 0.1 04/12/2012 1134   BASOSABS 0.0 04/12/2012 1134   Leonard Schwartz, M.D.

## 2012-11-15 NOTE — Op Note (Signed)
OPERATIVE NOTE  Patient's Name: Angela [name]  Date of Birth: Apr 07, 1986   Medical Records Number: [id number]   Date of Operation: 11/15/2012   Preoperative diagnosis:  [redacted]w[redacted]d weeks gestation  Prior Cesarean Section and Desire for Sterilization; CPT [phone] and [phone]  Desires Repeat Cesarean Section  Postoperative diagnosis:  [redacted]w[redacted]d weeks gestation  Prior Cesarean Section and Desire for Sterilization  Desires Repeat Cesarean Section  Procedure:  Repeat low transverse cesarean section Tubal sterilization procedure on the Left Partial Salpingectomy on the Right  Surgeon:  Leonard Schwartz, M.D.  Assistant:  Haroldine Laws, certified nurse midwife  Anesthesia:  Spinal  Disposition:  Angela [name] is a 27 y.o. female, [redacted]w[redacted]d, who presents at [redacted]w[redacted]d weeks gestation. The patient has been followed at the [facility] obstetrics and gynecology division of [facility] health care for women. This pregnancy has been complicated by two prior cesarean sections. The patient desires a repeat cesarean section and tubal sterilization. She understands the indications for her procedure and she accepts the risk of, but not limited to, anesthetic complications, bleeding, infections, possible tubal failure, and possible damage to the surrounding organs.  Findings:  A  female (Diante) was delivered from a OT position. The uterus, fallopian tubes, and ovaries were normal for the gravid state.  Procedure:  The patient was taken to the operating room where a spinal anesthetic was given.The perineum was prepped with betadine. A Foley catheter was placed in the bladder.The patient's abdomen was prepped with Duraprep.   The patient was sterilely draped. A "timeout" was performed which properly identified the patient and the correct operative procedure. The lower abdomen was injected with half percent Marcaine with epinephrine. A low transverse incision was made in the abdomen and  carried sharply through the subcutaneous tissue, the fascia, and the anterior peritoneum. An incision was made in the lower uterine segment. The incision was extended in a low transverse fashion. The membranes were ruptured. The fetal head was delivered without difficulty. The mouth and nose were suctioned. The remainder of the infant was then delivered. The cord was clamped and cut. The infant was handed to the awaiting pediatric team. The placenta was removed. The uterine cavity was cleaned of amniotic fluid, clotted blood, and membranes. The uterine incision was closed using a running locking suture of 2-0 Vicryl. An imbricating suture of 2-0 Vicryl was placed. The left fallopian tube was identified and followed to its fimbriated end. A knuckle of tube was made on the left using 2 free ties of 0 plain catgut suture. The knuckle of tube thus made was excised. Hemostasis was adequate. The entire fallopian tube was removed on the right. The mesosalpinx was sutured using 2-0 Vicryl. The pelvis was vigorously irrigated. Hemostasis was adequate. The anterior peritoneum and the abdominal musculature were closed using 2-0 Vicryl. The fascia was closed using a running suture of 0 Vicryl followed by 3 interrupted sutures of 0 Vicryl. The subcutaneous layer was closed using interrupted sutures of 2-0 Vicryl. The skin was reapproximated using a subcuticular suture of 3-0 Monocryl. Sponge, needle, and instrument counts were correct on 2 occasions. The estimated blood loss for the procedure was 800 cc. The patient tolerated her procedure well. She was transported to the recovery room in stable condition. The infant remained in the operating room with the mother for bonding. The placenta was sent to labor and delivery. The cut portions of the fallopian tubes were sent to pathology.  Leonard Schwartz, M.D.

## 2012-11-15 NOTE — Anesthesia Postprocedure Evaluation (Signed)
  Anesthesia Post Note  Patient: Angela [name]  Procedure(s) Performed: Procedure(s) (LRB): REPEAT CESAREAN SECTION WITH BILATERAL TUBAL LIGATION (Bilateral) UNILATERAL SALPINGECTOMY (Right)  Anesthesia type: Spinal  Patient location: PACU  Post pain: Pain level controlled  Post assessment: Post-op Vital signs reviewed  Last Vitals:  Filed Vitals:   11/15/12 1315  BP: 114/62  Pulse: 66  Temp:   Resp: 17    Post vital signs: Reviewed  Level of consciousness: awake  Complications: No apparent anesthesia complications

## 2012-11-16 ENCOUNTER — Encounter (HOSPITAL_COMMUNITY): Payer: Self-pay | Admitting: Obstetrics and Gynecology

## 2012-11-16 DIAGNOSIS — Z789 Other specified health status: Secondary | ICD-10-CM | POA: Diagnosis not present

## 2012-11-16 DIAGNOSIS — Z9851 Tubal ligation status: Secondary | ICD-10-CM

## 2012-11-16 LAB — CBC
HCT: 30.9 % — ABNORMAL LOW (ref 36.0–46.0)
MCH: 30.5 pg (ref 26.0–34.0)
MCV: 88.8 fL (ref 78.0–100.0)
Platelets: 244 10*3/uL (ref 150–400)
RBC: 3.48 MIL/uL — ABNORMAL LOW (ref 3.87–5.11)
WBC: 9.5 10*3/uL (ref 4.0–10.5)

## 2012-11-16 NOTE — Consult Note (Signed)
I have reviewed this delivery that was attended by Rosie Fate, NNP who works with our NICU team.    Ruben Gottron, MD Attending, NICU

## 2012-11-16 NOTE — Progress Notes (Signed)
Subjective: Postpartum Day 1: Repeat Cesarean Delivery and BTL Patient up ad lib, reports no syncope or dizziness. Voiding without difficulty and reports flatus Feeding:  Breastfeeding  Contraceptive plan:  BTL with C/S  Objective: Vital signs in last 24 hours: Temp:  [97.5 F (36.4 C)-98.7 F (37.1 C)] 98.5 F (36.9 C) (07/09 0726) Pulse Rate:  [47-93] 74 (07/09 0726) Resp:  [15-22] 18 (07/09 0726) BP: (99-122)/(60-75) 107/60 mmHg (07/09 0726) SpO2:  [96 %-100 %] 97 % (07/09 0726)  Physical Exam:  General: alert, cooperative and no distress Lochia: appropriate Uterine Fundus: firm Incision: healing well DVT Evaluation: No evidence of DVT seen on physical exam. Negative Homan's sign. JP drain:   n/a   Recent Labs  11/14/12 1151 11/16/12 0605  HGB 11.7* 10.6*  HCT 34.2* 30.9*    Assessment/Plan: Status post Cesarean section day 1. Doing well postoperatively.  Continue current care. Plan for discharge tomorrow per pt request    Charlane Westry 11/16/2012, 8:15 AM

## 2012-11-17 MED ORDER — OXYCODONE-ACETAMINOPHEN 5-325 MG PO TABS: 1.0000 | ORAL_TABLET | ORAL | Status: DC | PRN

## 2012-11-17 MED ORDER — IBUPROFEN 600 MG PO TABS: 600.0000 mg | ORAL_TABLET | Freq: Four times a day (QID) | ORAL | Status: DC

## 2012-11-17 NOTE — Discharge Summary (Signed)
  Angela [name]  Angela [name]  DOB:    01-22-1986 MRN:    [id number] CSN:    [id number]  Date of admission:                  11/15/12  Date of discharge:                   11/17/12  Procedures this admission: Repeat C/S and BTL  Date of Delivery: 11/15/12 by Dr. Stefano Gaul  Newborn Data:  Live born female  Birth Weight: 8 lb 0.9 oz (3655 g) APGAR: 7, 8  Home with mother. Name: "Angela [name]" Circumcision Plan: Office   History of Present Illness:  [name] is a 27 y.o. female, [phone], who presents at [redacted]w[redacted]d weeks gestation. The patient has been followed at the [facility] and Gynecology division of [facility].    Her pregnancy has been complicated by:  Patient Active Problem List   Diagnosis Date Noted  . Angela delivery delivered 11/16/2012  . S/P tubal ligation 11/16/2012  . Breastfeeding (infant) 11/16/2012  . Previous Angela delivery, delivered, with or without mention of antepartum condition 05/26/2012  . Hypertension - pt denies any history and never took BP med 07/02/2011  . Menstrual irregularity 07/02/2011     Hospital course:  The patient was admitted for repeat c/s.   Her postpartum course was not complicated.  She was discharged to home on postpartum day 2 doing well.  Feeding:  breast  Contraception:  bilateral tubal ligation  Discharge hemoglobin:  Hemoglobin  Date Value Range Status  11/16/2012 10.6* 12.0 - 15.0 g/dL Final     HCT  Date Value Range Status  11/16/2012 30.9* 36.0 - 46.0 % Final    Discharge Physical Exam:   General: alert, cooperative and no distress Lochia: appropriate Uterine Fundus: firm Incision: healing well DVT Evaluation: No evidence of DVT seen on physical exam. Negative Homan's sign.  Intrapartum Procedures: Angela: low cervical, transverse and tubal ligation Postpartum Procedures: none Complications-Operative and Postpartum:  none  Discharge Diagnoses: Term Pregnancy-delivered and mild anemia  Discharge Information:  Activity:           pelvic rest Diet:                routine Medications: PNV, Ibuprofen and Percocet Condition:      stable Instructions:  refer to practice specific booklet Discharge to: home  Follow-up Information   Follow up with [facility] Ob[facility]necology. Schedule an appointment as soon as possible for a visit in 6 weeks. (Call with any questions or concerns)    Contact information:   [address] [place] [address] [phone]       Angela [name] 11/17/2012

## 2012-11-19 ENCOUNTER — Inpatient Hospital Stay (HOSPITAL_COMMUNITY)
Admission: AD | Admit: 2012-11-19 | Discharge: 2012-11-20 | Disposition: A | Discharge status: Home / Self Care | Payer: Medicaid Other | Source: Ambulatory Visit | Attending: Obstetrics and Gynecology | Admitting: Obstetrics and Gynecology

## 2012-11-19 ENCOUNTER — Encounter (HOSPITAL_COMMUNITY): Payer: Self-pay | Admitting: *Deleted

## 2012-11-19 DIAGNOSIS — R030 Elevated blood-pressure reading, without diagnosis of hypertension: Secondary | ICD-10-CM

## 2012-11-19 DIAGNOSIS — O99893 Other specified diseases and conditions complicating puerperium: Secondary | ICD-10-CM | POA: Insufficient documentation

## 2012-11-19 LAB — COMPREHENSIVE METABOLIC PANEL
Albumin: 3 g/dL — ABNORMAL LOW (ref 3.5–5.2)
Alkaline Phosphatase: 171 U/L — ABNORMAL HIGH (ref 39–117)
BUN: 8 mg/dL (ref 6–23)
Chloride: 102 mEq/L (ref 96–112)
Creatinine, Ser: 0.57 mg/dL (ref 0.50–1.10)
GFR calc Af Amer: >90 mL/min (ref >90)
GFR calc non Af Amer: >90 mL/min (ref >90)
Glucose, Bld: 91 mg/dL (ref 70–99)
Potassium: 3.7 mEq/L (ref 3.5–5.1)
Total Bilirubin: 0.3 mg/dL (ref 0.3–1.2)

## 2012-11-19 LAB — URINE MICROSCOPIC-ADD ON

## 2012-11-19 LAB — URINALYSIS, ROUTINE W REFLEX MICROSCOPIC
Bilirubin Urine: NEGATIVE
Ketones, ur: NEGATIVE mg/dL
Nitrite: NEGATIVE
Protein, ur: NEGATIVE mg/dL
Specific Gravity, Urine: 1.01 (ref 1.005–1.030)
Urobilinogen, UA: 0.2 mg/dL (ref 0.0–1.0)

## 2012-11-19 LAB — CBC
Platelets: 271 10*3/uL (ref 150–400)
RBC: 3.74 MIL/uL — ABNORMAL LOW (ref 3.87–5.11)
RDW: 13.7 % (ref 11.5–15.5)
WBC: 6.2 10*3/uL (ref 4.0–10.5)

## 2012-11-19 LAB — PROTEIN / CREATININE RATIO, URINE
Creatinine, Urine: 21.61 mg/dL
Protein Creatinine Ratio: 0.19 — ABNORMAL HIGH (ref 0.00–0.15)
Total Protein, Urine: 4.2 mg/dL

## 2012-11-19 LAB — URIC ACID: Uric Acid, Serum: 5.8 mg/dL (ref 2.4–7.0)

## 2012-11-19 NOTE — MAU Note (Signed)
Pt in c/o headache x1.5 hours.  States she took blood pressure at home and was 182/94.  Reports increased swelling.  Denies any lightheadedness or dizziness. SVD on 11/15/12.

## 2012-11-19 NOTE — MAU Provider Note (Signed)
History    CSN: [id number]  Arrival date and time: 11/19/12 2143   First Provider Initiated Contact with Patient 11/19/12 2209      Chief Complaint  Patient presents with  . Hypertension   HPI Pt reports unannounced to MAU at 4 days postop from repeat LTCS and BTL on 11/15/12 with c/o of elevated blood pressure today as well as headache.   She also noted onset of edema in her feet and ankles bilaterally today.  She states she has taken her blood pressure twice at home today and values were 160s-170s/90-95.  She took her blood pressure at home due to the swelling and headache.  She denies any vision chgs or RUQ pain.  She denies any previous hx of preeclampsia.  She does have a hx of elevated blood pressures over 30yr ago but did not go for follow-up and has never taken meds.  She denies increased activity today.  She reports she is tol po liquids and solids without difficulty.  She denies any difficulty with voiding.  Her incisional pain is well controlled with medications and she last took Percocet and Motrin at 7:30pm with some relief of her headache.  States the headache is bilateral and describes as throbbing.  She currently ranks her headache as 4/10 on pain scale and 9/10 prior to medication.  Pos flatus and pos BM.  She is breastfeeding without difficulty.    OB History   Grav Para Term Preterm Abortions TAB SAB Ect Mult Living   3 3 2 1      3      Obstetric Comments   2005 HYPEREMESIS 2010 MECONIUM ASPIRATION      Past Medical History  Diagnosis Date  . Ovarian cyst rupture   . Abnormal Pap smear AGE 22    COLPO LAST PAP 08/2011  . Preterm labor   . Infection AGE 72    CHLAMYDIA; GONORRHEA  . 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 Infection     BV  . Complication of anesthesia     NAUSEA; VOMITING; ITCHING; DIFFICULTY BREATHING  . PONV (postoperative nausea and vomiting)   . Anemia   . GERD (gastroesophageal reflux disease) AGE 86    tums prn    Past Surgical History  Procedure  Laterality Date  . Cervix lesion destruction    . Wisdom tooth extraction    . Cesarean section  2005, 2010    x2  . Colposcopy    . Cesarean section with bilateral tubal ligation Bilateral 11/15/2012    Procedure: REPEAT CESAREAN SECTION WITH BILATERAL TUBAL LIGATION;  Surgeon: Kirkland Hun, MD;  Location: [facility];  Service: Obstetrics;  Laterality: Bilateral;  . Unilateral salpingectomy Right 11/15/2012    Procedure: UNILATERAL SALPINGECTOMY;  Surgeon: Kirkland Hun, MD;  Location: [facility];  Service: Obstetrics;  Laterality: Right;    Family History  Problem Relation Age of Onset  . Anesthesia problems Mother     NAUSEA AND VOMITING  . Thyroid disease Mother   . Fibromyalgia Mother   . Sarcoidosis Mother   . Depression Mother   . Other Mother     VARICOSE VEINS  . Anesthesia problems Other   . Diabetes Father   . Hypertension Sister   . Cancer Maternal Grandfather     prostate  . Depression Maternal Aunt   . Sarcoidosis Maternal Aunt   . Arthritis Maternal Grandmother   . Heart disease Maternal Grandmother   . Hypertension Maternal Grandmother   .  Kidney disease Maternal Grandmother     FAILURE  . Asthma Cousin     History  Substance Use Topics  . Smoking status: Never Smoker   . Smokeless tobacco: Never Used  . Alcohol Use: 1.8 oz/week    2 Glasses of wine, 1 Shots of liquor per week     Comment: weekends but none since pregnancy    Allergies:  Allergies  Allergen Reactions  . Amoxicillin Hives    Prescriptions prior to admission  Medication Sig Dispense Refill  . ferrous sulfate 325 (65 FE) MG EC tablet Take 325 mg by mouth daily.      Marland Kitchen ibuprofen (ADVIL,MOTRIN) 600 MG tablet Take 1 tablet (600 mg total) by mouth every 6 (six) hours.  30 tablet  0  . oxyCODONE-acetaminophen (PERCOCET/ROXICET) 5-325 MG per tablet Take 1-2 tablets by mouth every 4 (four) hours as needed.  30 tablet  0  . Prenatal Vit-Fe Fumarate-FA (PRENATAL MULTIVITAMIN) TABS Take 1 tablet  by mouth daily.        Review of Systems  Constitutional: Negative.   Eyes: Negative.   Respiratory: Negative.   Cardiovascular: Positive for leg swelling.  Gastrointestinal: Negative.   Genitourinary: Negative.   Musculoskeletal: Negative.   Skin: Negative.   Neurological: Positive for headaches.  Endo/Heme/Allergies: Negative.   Psychiatric/Behavioral: Negative.    Physical Exam   Blood pressure 144/86, pulse 50, resp. rate 18, height 5\' 3"  (1.6 m), weight 81.647 kg (180 lb), last menstrual period 02/16/2012, currently breastfeeding. Filed Vitals:   11/19/12 2330 11/20/12 0000 11/20/12 0015 11/20/12 0030  BP: 178/91 174/95 169/103 170/94  Pulse: 66 66 84 66  TempSrc:      Resp:      Height:      Weight:       Physical Exam  Constitutional: She is oriented to person, place, and time. She appears well-developed and well-nourished.  HENT:  Head: Normocephalic and atraumatic.  Right Ear: External ear normal.  Left Ear: External ear normal.  Nose: Nose normal.  Eyes: Conjunctivae are normal. Pupils are equal, round, and reactive to light.  Neck: Normal range of motion. Neck supple.  Cardiovascular: Normal rate, regular rhythm and intact distal pulses.   Respiratory: Effort normal and breath sounds normal.  GI: Soft. Bowel sounds are normal. There is no tenderness. There is no rebound and no guarding.  Pfannenstiel incision clean, dry and intact without redness or drainage present.   Genitourinary:  Pelvic exam deferred.  Ut firm, non-tender and 2 below umbilicus.  Scant amt lochia rubra noted.  Musculoskeletal: She exhibits edema.  2+ pedal and ankle edema noted.  No pretibial edema noted.   Neurological: She is alert and oriented to person, place, and time. She has normal reflexes.  Skin: Skin is warm and dry.  Psychiatric: She has a normal mood and affect. Her behavior is normal.    MAU Course  Procedures Results for orders placed during the hospital encounter of  11/19/12 (from the past 24 hour(s))  PROTEIN / CREATININE RATIO, URINE     Status: Abnormal   Collection Time    11/19/12  9:50 PM      Result Value Range   Creatinine, Urine 21.61     Total Protein, Urine 4.2     PROTEIN CREATININE RATIO 0.19 (*) 0.00 - 0.15  URINALYSIS, ROUTINE W REFLEX MICROSCOPIC     Status: Abnormal   Collection Time    11/19/12  9:50 PM  Result Value Range   Color, Urine YELLOW  YELLOW   APPearance CLEAR  CLEAR   Specific Gravity, Urine 1.010  1.005 - 1.030   pH 7.0  5.0 - 8.0   Glucose, UA NEGATIVE  NEGATIVE mg/dL   Hgb urine dipstick TRACE (*) NEGATIVE   Bilirubin Urine NEGATIVE  NEGATIVE   Ketones, ur NEGATIVE  NEGATIVE mg/dL   Protein, ur NEGATIVE  NEGATIVE mg/dL   Urobilinogen, UA 0.2  0.0 - 1.0 mg/dL   Nitrite NEGATIVE  NEGATIVE   Leukocytes, UA NEGATIVE  NEGATIVE  URINE MICROSCOPIC-ADD ON     Status: None   Collection Time    11/19/12  9:50 PM      Result Value Range   Squamous Epithelial / LPF RARE  RARE   WBC, UA 0-2  <3 WBC/hpf   RBC / HPF 0-2  <3 RBC/hpf   Bacteria, UA RARE  RARE  COMPREHENSIVE METABOLIC PANEL     Status: Abnormal   Collection Time    11/19/12 10:18 PM      Result Value Range   Sodium 137  135 - 145 mEq/L   Potassium 3.7  3.5 - 5.1 mEq/L   Chloride 102  96 - 112 mEq/L   CO2 22  19 - 32 mEq/L   Glucose, Bld 91  70 - 99 mg/dL   BUN 8  6 - 23 mg/dL   Creatinine, Ser 1.61  0.50 - 1.10 mg/dL   Calcium 9.4  8.4 - 09.6 mg/dL   Total Protein 6.3  6.0 - 8.3 g/dL   Albumin 3.0 (*) 3.5 - 5.2 g/dL   AST 60 (*) 0 - 37 U/L   ALT 80 (*) 0 - 35 U/L   Alkaline Phosphatase 171 (*) 39 - 117 U/L   Total Bilirubin 0.3  0.3 - 1.2 mg/dL   GFR calc non Af Amer >90  >90 mL/min   GFR calc Af Amer >90  >90 mL/min  CBC     Status: Abnormal   Collection Time    11/19/12 10:18 PM      Result Value Range   WBC 6.2  4.0 - 10.5 K/uL   RBC 3.74 (*) 3.87 - 5.11 MIL/uL   Hemoglobin 11.4 (*) 12.0 - 15.0 g/dL   HCT 04.5 (*) 40.9 - 81.1 %    MCV 88.0  78.0 - 100.0 fL   MCH 30.5  26.0 - 34.0 pg   MCHC 34.7  30.0 - 36.0 g/dL   RDW 91.4  78.2 - 95.6 %   Platelets 271  150 - 400 K/uL  URIC ACID     Status: None   Collection Time    11/19/12 10:18 PM      Result Value Range   Uric Acid, Serum 5.8  2.4 - 7.0 mg/dL      Assessment and Plan  Post Op day 4-s/p repeat LTCS with BTL Elevated blood pressure  Consult obtained with Dr. Estanislado Pandy. Will give IV labetalol 20mg  x 1 and assess response.  If BP normalizes will discharge to home with po labetalol and HCTZ.  If BP does not normalize with admit for 23hr observation for blood pressure evaluation and stabilization.    Jaxsen Bernhart O. 11/19/2012, 10:18 PM

## 2012-11-20 MED ORDER — LABETALOL HCL 200 MG PO TABS: 200.0000 mg | ORAL_TABLET | Freq: Two times a day (BID) | ORAL | Status: DC

## 2012-11-20 MED ORDER — HYDROCHLOROTHIAZIDE 25 MG PO TABS: 25.0000 mg | ORAL_TABLET | Freq: Every day | ORAL | Status: DC

## 2012-11-20 MED ORDER — LABETALOL HCL 100 MG PO TABS
200.0000 mg | ORAL_TABLET | Freq: Two times a day (BID) | ORAL | Status: DC
  Administered 2012-11-20: 200 mg via ORAL
  Filled 2012-11-20: qty 2

## 2012-11-20 MED ORDER — LABETALOL HCL 5 MG/ML IV SOLN
20.0000 mg | Freq: Once | INTRAVENOUS | Status: AC
  Administered 2012-11-20: 20 mg via INTRAVENOUS
  Filled 2012-11-20: qty 4

## 2012-11-20 MED ORDER — LACTATED RINGERS IV SOLN
INTRAVENOUS | Status: DC
  Administered 2012-11-20: 20 mL/h via INTRAVENOUS

## 2012-11-20 NOTE — Discharge Instructions (Signed)
Blood Pressure Record Sheet  Your blood pressure on this visit to the Emergency Department or Clinic is elevated. This does not necessarily mean you have hypertension, but it does mean that your blood pressure needs to be rechecked. Many times blood pressure increases because of illness, pain, anxiety, or other factors.  We recommend that you get a series of blood pressure readings done over a period of about 5 days. It is best to get a reading in the morning and one in the evening. You should make sure you sit and relax for 1 to 5 minutes before the reading is taken. Write the readings down and make a follow-up appointment with your doctor to discuss the results. If there is not a free clinic or a drug store with blood pressure taking machines near you, you can purchase good blood pressure taking equipment from a drug store, often for less than the price of a physician's office call. Having one in the home also allows you the convenience of taking your blood pressure while you are home and in relaxed surroundings.  Your blood pressure in the Emergency Department or Clinic on ________ was ____________________.  BLOOD PRESSURE LOG  Date: _______________________  · a.m. _____________________  · p.m. _____________________  Date: _______________________  · a.m. _____________________  · p.m. _____________________  Date: _______________________  · a.m. _____________________  · p.m. _____________________  Date: _______________________  · a.m. _____________________  · p.m. _____________________  Date: _______________________  · a.m. _____________________  · p.m. _____________________  Document Released: 01/24/2003 Document Revised: 07/20/2011 Document Reviewed: 04/27/2005  ExitCare® Patient Information ©2014 [employer].

## 2013-03-16 ENCOUNTER — Other Ambulatory Visit: Payer: Self-pay

## 2014-02-14 ENCOUNTER — Other Ambulatory Visit: Payer: Self-pay | Admitting: Urology

## 2014-02-14 DIAGNOSIS — N920 Excessive and frequent menstruation with regular cycle: Secondary | ICD-10-CM

## 2014-02-14 DIAGNOSIS — R102 Pelvic and perineal pain: Secondary | ICD-10-CM

## 2014-02-19 ENCOUNTER — Ambulatory Visit
Admission: RE | Admit: 2014-02-19 | Discharge: 2014-02-19 | Disposition: A | Discharge status: Home / Self Care | Payer: BC Managed Care – PPO | Source: Ambulatory Visit | Attending: Urology | Admitting: Urology

## 2014-02-19 DIAGNOSIS — N920 Excessive and frequent menstruation with regular cycle: Secondary | ICD-10-CM

## 2014-02-19 DIAGNOSIS — R102 Pelvic and perineal pain: Secondary | ICD-10-CM

## 2014-03-12 ENCOUNTER — Encounter (HOSPITAL_COMMUNITY): Payer: Self-pay | Admitting: *Deleted

## 2014-11-08 ENCOUNTER — Ambulatory Visit: Payer: Self-pay | Admitting: Family Medicine

## 2015-02-14 ENCOUNTER — Ambulatory Visit (INDEPENDENT_AMBULATORY_CARE_PROVIDER_SITE_OTHER): Payer: Managed Care, Other (non HMO) | Admitting: Family Medicine

## 2015-02-14 ENCOUNTER — Encounter: Payer: Self-pay | Admitting: Family Medicine

## 2015-02-14 VITALS — BP 120/80 | HR 76 | Temp 99.0°F | Ht 63.5 in | Wt 176.5 lb

## 2015-02-14 DIAGNOSIS — K219 Gastro-esophageal reflux disease without esophagitis: Secondary | ICD-10-CM | POA: Diagnosis not present

## 2015-02-14 DIAGNOSIS — R350 Frequency of micturition: Secondary | ICD-10-CM | POA: Diagnosis not present

## 2015-02-14 DIAGNOSIS — Z8669 Personal history of other diseases of the nervous system and sense organs: Secondary | ICD-10-CM | POA: Diagnosis not present

## 2015-02-14 DIAGNOSIS — A6000 Herpesviral infection of urogenital system, unspecified: Secondary | ICD-10-CM

## 2015-02-14 DIAGNOSIS — E669 Obesity, unspecified: Secondary | ICD-10-CM | POA: Diagnosis not present

## 2015-02-14 DIAGNOSIS — N3941 Urge incontinence: Secondary | ICD-10-CM

## 2015-02-14 DIAGNOSIS — E663 Overweight: Secondary | ICD-10-CM | POA: Insufficient documentation

## 2015-02-14 LAB — POCT URINALYSIS DIP (MANUAL ENTRY)
Bilirubin, UA: NEGATIVE
Blood, UA: NEGATIVE
GLUCOSE UA: NEGATIVE
Ketones, POC UA: NEGATIVE
Leukocytes, UA: NEGATIVE
NITRITE UA: NEGATIVE
PROTEIN UA: NEGATIVE
SPEC GRAV UA: 1.015
UROBILINOGEN UA: 0.2
pH, UA: 6

## 2015-02-14 MED ORDER — VALACYCLOVIR HCL 1 G PO TABS: 1000.0000 mg | ORAL_TABLET | Freq: Two times a day (BID) | ORAL | Status: DC

## 2015-02-14 MED ORDER — TOLTERODINE TARTRATE ER 4 MG PO CP24: 4.0000 mg | ORAL_CAPSULE | Freq: Every day | ORAL | Status: DC

## 2015-02-14 NOTE — Assessment & Plan Note (Addendum)
INadequate control. Trigger acpoidance.  Trial of prilosec 40 mg

## 2015-02-14 NOTE — Assessment & Plan Note (Signed)
UA clear. Trial of detrol LA given pt very bothered with symptoms.

## 2015-02-14 NOTE — Progress Notes (Signed)
   Subjective:    Patient ID: Angela [name], female    DOB: [birth year], 30 y.o.   MRN: [id number]  HPI   29 year old female patient presents to establish care.  She has been seen by her GYN/OB ( Dr. Stefano Gaul with the birth of her last child in 11/2012. G3P3  HX of 3  C-sections.  Since last delivery she has urinary urgency, occ cannot make it to bathroom. Urinates every 45 min. She wakes up 1-2 times at night to pee.  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  No straining to urinate, feels like empties. She has been doing Kegel exercises.  No burning, no blood in urine.  No fever.  She had some elevated blood pressures after pregnancy/delivery.  BP Readings from Last 3 Encounters:  02/14/15 120/80  11/20/12 140/81  11/17/12 104/67   Body mass index is 30.77 kg/(m^2). GERD:  DX 15 years ago. Treated with aciphex helped. Nexium did not help as much. Did not return to  PCP so stopped med. Now uses Tums prn. Never had endoscopy. Now occuring 3-4 times week. No specific triggers.   Hx of migraine: not regularly or recently.  genital herpes: outbreaks 2 in last year. Current outbreak in last week.   Wellness program at work:  8/20176 Cholesterol:tot 127 HDl: 45 LDL:63  tri 88 Glucose 109  A1C 5.6  Diet: good  Exercise: walks daily, goes to gym 3 times a week. She has tried herbal life, garcinia cambogia   Review of Systems  Constitutional: Positive for unexpected weight change. Negative for fatigue.       Objective:   Physical Exam  Constitutional: Vital signs are normal. She appears well-developed and well-nourished. She is cooperative.  Non-toxic appearance. She does not appear ill. No distress.  HENT:  Head: Normocephalic.  Right Ear: Hearing, tympanic membrane, external ear and ear canal normal.  Left Ear: Hearing, tympanic membrane, external ear and ear canal normal.  Nose: Nose normal.  Eyes: Conjunctivae, EOM  and lids are normal. Pupils are equal, round, and reactive to light. Lids are everted and swept, no foreign bodies found.  Neck: Trachea normal and normal range of motion. Neck supple. Carotid bruit is not present. No thyroid mass and no thyromegaly present.  Cardiovascular: Normal rate, regular rhythm, S1 normal, S2 normal, normal heart sounds and intact distal pulses.  Exam reveals no gallop.   No murmur heard. Pulmonary/Chest: Effort normal and breath sounds normal. No respiratory distress. She has no wheezes. She has no rhonchi. She has no rales.  Abdominal: Soft. Normal appearance and bowel sounds are normal. She exhibits no distension, no fluid wave, no abdominal bruit and no mass. There is no hepatosplenomegaly. There is no tenderness. There is no rebound, no guarding and no CVA tenderness. No hernia.  Lymphadenopathy:    She has no cervical adenopathy.    She has no axillary adenopathy.  Neurological: She is alert. She has normal strength. No cranial nerve deficit or sensory deficit.  Skin: Skin is warm, dry and intact. No rash noted.  Psychiatric: Her speech is normal and behavior is normal. Judgment normal. Her mood appears not anxious. Cognition and memory are normal. She does not exhibit a depressed mood.          Assessment & Plan:

## 2015-02-14 NOTE — Progress Notes (Signed)
Pre visit review using our clinic review tool, if applicable. No additional management support is needed unless otherwise documented below in the visit note. 

## 2015-02-14 NOTE — Assessment & Plan Note (Signed)
Treatment with valacyclovir course.  no clear indication for supression.

## 2015-02-14 NOTE — Patient Instructions (Addendum)
Increase exercise as able. Look  into  [employer].  Stop at front desk for nutrition referral.  Complete valacyclovir course. Prilosec 2 tab of 20 mg daily x2 weeks, call if not improving, if better continue for 4-6 weeks then taper off.  Avoid triggers like caffeine, carbonation, citris, tomato, chocolate, peppermint, spicy foods.  Start Detrol LA for urge incontinence.

## 2015-02-14 NOTE — Assessment & Plan Note (Signed)
Encouraged exercise, weight loss, healthy eating habits. Counseled on low carb low fat diet.  Refer to nutritionist.

## 2015-04-09 ENCOUNTER — Other Ambulatory Visit: Payer: Self-pay | Admitting: Family Medicine

## 2015-04-09 MED ORDER — VALACYCLOVIR HCL 1 G PO TABS: 1000.0000 mg | ORAL_TABLET | Freq: Two times a day (BID) | ORAL | Status: DC

## 2015-04-09 NOTE — Telephone Encounter (Signed)
Last office visit 02/14/2015.  Last refilled 02/14/2015 for #20 with no refills.  Ok to refill?

## 2015-04-23 ENCOUNTER — Ambulatory Visit: Payer: Managed Care, Other (non HMO) | Admitting: Family Medicine

## 2015-04-29 ENCOUNTER — Other Ambulatory Visit: Payer: Self-pay | Admitting: *Deleted

## 2015-04-29 MED ORDER — TOLTERODINE TARTRATE ER 4 MG PO CP24: 4.0000 mg | ORAL_CAPSULE | Freq: Every day | ORAL | Status: DC

## 2015-05-07 ENCOUNTER — Telehealth: Payer: Self-pay | Admitting: Family Medicine

## 2015-05-07 DIAGNOSIS — Z13220 Encounter for screening for lipoid disorders: Secondary | ICD-10-CM

## 2015-05-07 NOTE — Telephone Encounter (Signed)
-----   Message from Terri J Walsh sent at 04/25/2015 12:28 PM EST ----- Regarding: Lab orders for Thursday, 12.29.16 Patient is scheduled for CPX labs, please order future labs, Thanks , Terri  

## 2015-05-09 ENCOUNTER — Other Ambulatory Visit (INDEPENDENT_AMBULATORY_CARE_PROVIDER_SITE_OTHER): Payer: Managed Care, Other (non HMO)

## 2015-05-09 DIAGNOSIS — Z13220 Encounter for screening for lipoid disorders: Secondary | ICD-10-CM

## 2015-05-10 LAB — LIPID PANEL
CHOL/HDL RATIO: 2.4 ratio (ref 0.0–4.4)
Cholesterol, Total: 130 mg/dL (ref 100–199)
HDL: 55 mg/dL (ref >39)
LDL Calculated: 62 mg/dL (ref 0–99)
Triglycerides: 64 mg/dL (ref 0–149)
VLDL CHOLESTEROL CAL: 13 mg/dL (ref 5–40)

## 2015-05-10 LAB — COMPREHENSIVE METABOLIC PANEL
ALBUMIN: 4.5 g/dL (ref 3.5–5.5)
ALK PHOS: 59 IU/L (ref 39–117)
ALT: 14 IU/L (ref 0–32)
AST: 17 IU/L (ref 0–40)
Albumin/Globulin Ratio: 1.8 (ref 1.1–2.5)
BILIRUBIN TOTAL: 0.2 mg/dL (ref 0.0–1.2)
BUN / CREAT RATIO: 16 (ref 8–20)
BUN: 13 mg/dL (ref 6–20)
CHLORIDE: 99 mmol/L (ref 96–106)
CO2: 24 mmol/L (ref 18–29)
CREATININE: 0.79 mg/dL (ref 0.57–1.00)
Calcium: 9.6 mg/dL (ref 8.7–10.2)
GFR calc Af Amer: 116 mL/min/{1.73_m2} (ref >59)
GFR calc non Af Amer: 101 mL/min/{1.73_m2} (ref >59)
GLUCOSE: 102 mg/dL — AB (ref 65–99)
Globulin, Total: 2.5 g/dL (ref 1.5–4.5)
Potassium: 4.7 mmol/L (ref 3.5–5.2)
Sodium: 140 mmol/L (ref 134–144)
Total Protein: 7 g/dL (ref 6.0–8.5)

## 2015-05-14 ENCOUNTER — Ambulatory Visit (INDEPENDENT_AMBULATORY_CARE_PROVIDER_SITE_OTHER): Payer: Managed Care, Other (non HMO) | Admitting: Family Medicine

## 2015-05-14 ENCOUNTER — Encounter: Payer: Self-pay | Admitting: Family Medicine

## 2015-05-14 ENCOUNTER — Other Ambulatory Visit (HOSPITAL_COMMUNITY)
Admission: RE | Admit: 2015-05-14 | Discharge: 2015-05-14 | Disposition: A | Discharge status: Home / Self Care | Payer: Managed Care, Other (non HMO) | Source: Ambulatory Visit | Attending: Family Medicine | Admitting: Family Medicine

## 2015-05-14 VITALS — BP 160/104 | HR 81 | Temp 98.5°F | Ht 63.25 in | Wt 174.0 lb

## 2015-05-14 DIAGNOSIS — R030 Elevated blood-pressure reading, without diagnosis of hypertension: Secondary | ICD-10-CM

## 2015-05-14 DIAGNOSIS — Z01419 Encounter for gynecological examination (general) (routine) without abnormal findings: Secondary | ICD-10-CM | POA: Diagnosis present

## 2015-05-14 DIAGNOSIS — N3941 Urge incontinence: Secondary | ICD-10-CM

## 2015-05-14 DIAGNOSIS — Z1151 Encounter for screening for human papillomavirus (HPV): Secondary | ICD-10-CM | POA: Diagnosis not present

## 2015-05-14 DIAGNOSIS — E669 Obesity, unspecified: Secondary | ICD-10-CM

## 2015-05-14 DIAGNOSIS — N920 Excessive and frequent menstruation with regular cycle: Secondary | ICD-10-CM

## 2015-05-14 DIAGNOSIS — Z0000 Encounter for general adult medical examination without abnormal findings: Secondary | ICD-10-CM | POA: Diagnosis not present

## 2015-05-14 DIAGNOSIS — I10 Essential (primary) hypertension: Secondary | ICD-10-CM | POA: Insufficient documentation

## 2015-05-14 DIAGNOSIS — R7303 Prediabetes: Secondary | ICD-10-CM

## 2015-05-14 MED ORDER — LIRAGLUTIDE 18 MG/3ML ~~LOC~~ SOPN: 0.6000 mg | PEN_INJECTOR | Freq: Every day | SUBCUTANEOUS | Status: DC

## 2015-05-14 NOTE — Addendum Note (Signed)
Addended by: Alvina ChouWALSH, Andri Prestia J on: 05/14/2015 12:23 PM   Modules accepted: Orders

## 2015-05-14 NOTE — Assessment & Plan Note (Signed)
Encouraged exercise, weight loss, healthy eating habits.  Discussed meds to treat.  Will start victoza daily. Close follow up in 84month.

## 2015-05-14 NOTE — Assessment & Plan Note (Signed)
Much improved on Detrol LA

## 2015-05-14 NOTE — Assessment & Plan Note (Addendum)
Previously well controlled.. Now with elevation today. ?new dx of HTN.  Follow BP at home.. Call at end of week. Will start losartan if truly elevated.  Avoid diuretic given urinary frequency issues.

## 2015-05-14 NOTE — Addendum Note (Signed)
Addended by: Desmond DikeKNIGHT, Lilibeth Opie H on: 05/14/2015 01:10 PM   Modules accepted: Orders

## 2015-05-14 NOTE — Assessment & Plan Note (Signed)
Check CBC for anemia or platelet issue, no clear sing of bleeding issue with no bruising.  Given firmness of uterus.. eval with US.  May need referral to GYN as she does not tolerate hormonal treatment in past.

## 2015-05-14 NOTE — Patient Instructions (Addendum)
Stop at lab on way out.  Follow BP at home, Goal < 140/90.  Record measurements and send at the end of week by Mychart. Call sooner if BP> 160/100.  Start  victoza daily.  Get back to regular exercise and  Stay on [employer] diet, low carb.  Follow up in 1 month 30 min OV for weight check.  Stop at front desk for referral for US.

## 2015-05-14 NOTE — Assessment & Plan Note (Signed)
Improved with  Diet but not at goal. Start victoza daily.

## 2015-05-14 NOTE — Progress Notes (Signed)
Pre visit review using our clinic review tool, if applicable. No additional management support is needed unless otherwise documented below in the visit note. 

## 2015-05-14 NOTE — Progress Notes (Signed)
Subjective:    Patient ID: Angela [name], female    DOB: 11/21/1985, 30 y.o.   MRN: [id number]  HPI   The patient is here for annual wellness exam and preventative care.   Elevated blood pressure: Hx of  elevated blood pressures after pregnancy/delivery. She has had some recent severe headache. She has not been checking BP lately. BP Readings from Last 3 Encounters:  05/14/15 160/104  02/14/15 120/80  11/20/12 140/81  Chest pain with exertion: none Edema:None Short of breath:none Average home BPs: not checking Other issues:  Wt Readings from Last 3 Encounters:  05/14/15 174 lb (78.926 kg)  02/14/15 176 lb 8 oz (80.06 kg)  11/19/12 180 lb (81.647 kg)   Menorrhagia: She reports she has ben having very heavy menses in last 2 years, worse in last 6 months. S/P BTL  She has to use 12 tampons a day, soaked. Sees clots. Lasts 7-8 days Wears panty liners for a week after. No lightheadedness, no fatigue.  No easy bruising. Some abd pain and cramping.  Once a month menses. SE to several OCPs in past. Seasonique, Lo-ovral.etc others.  Urinary urgency: Trial of Detrol at last OV 02/2015 She has noted a significant improvement.  UOP 6 a day. No nocturia now. No SE.  Labs reviewed in detail  Lab Results  Component Value Date   CHOL 130 05/09/2015   HDL 55 05/09/2015   LDLCALC 62 05/09/2015   TRIG 64 05/09/2015   CHOLHDL 2.4 05/09/2015  Wellness program at work: 12/2014 Cholesterol:tot 127 HDl: 45 LDL:63 tri 88 Glucose 109 M5HA1C 5.6  Prediabetes: discussed diet and lifestyle changes.   Exercise:  Walking some. Work out tape at home. Has stopped lately given discouraged. Diet: moderate, tried [employer] diet...lost 10 lbs, she is in phase 2.  Review of Systems  Constitutional: Negative for fever and fatigue.  HENT: Negative for ear pain.   Eyes: Negative for pain.  Respiratory: Negative for chest tightness and shortness of breath.   Cardiovascular: Negative for  chest pain, palpitations and leg swelling.  Gastrointestinal: Negative for abdominal pain.  Genitourinary: Negative for dysuria.       Objective:   Physical Exam  Constitutional: Vital signs are normal. She appears well-developed and well-nourished. She is cooperative.  Non-toxic appearance. She does not appear ill. No distress.  HENT:  Head: Normocephalic.  Right Ear: Hearing, tympanic membrane, external ear and ear canal normal.  Left Ear: Hearing, tympanic membrane, external ear and ear canal normal.  Nose: Nose normal.  Eyes: Conjunctivae, EOM and lids are normal. Pupils are equal, round, and reactive to light. Lids are everted and swept, no foreign bodies found.  Neck: Trachea normal and normal range of motion. Neck supple. Carotid bruit is not present. No thyroid mass and no thyromegaly present.  Cardiovascular: Normal rate, regular rhythm, S1 normal, S2 normal, normal heart sounds and intact distal pulses.  Exam reveals no gallop.   No murmur heard. Pulmonary/Chest: Effort normal and breath sounds normal. No respiratory distress. She has no wheezes. She has no rhonchi. She has no rales.  Abdominal: Soft. Normal appearance and bowel sounds are normal. She exhibits no distension, no fluid wave, no abdominal bruit and no mass. There is no hepatosplenomegaly. There is no tenderness. There is no rebound, no guarding and no CVA tenderness. No hernia.  Genitourinary: Vagina normal. No breast swelling, tenderness, discharge or bleeding. Pelvic exam was performed with patient supine. There is no rash, tenderness or lesion  on the right labia. There is no rash, tenderness or lesion on the left labia. Uterus is enlarged. Uterus is not tender. Cervix exhibits no motion tenderness, no discharge and no friability. Right adnexum displays no mass, no tenderness and no fullness. Left adnexum displays no mass, no tenderness and no fullness.  ? Fibroid in uterus  Lymphadenopathy:    She has no cervical  adenopathy.    She has no axillary adenopathy.  Neurological: She is alert. She has normal strength. No cranial nerve deficit or sensory deficit.  Skin: Skin is warm, dry and intact. No rash noted.  Psychiatric: Her speech is normal and behavior is normal. Judgment normal. Her mood appears not anxious. Cognition and memory are normal. She does not exhibit a depressed mood.          Assessment & Plan:  The patient's preventative maintenance and recommended screening tests for an annual wellness exam were reviewed in full today. Brought up to date unless services declined.  Counselled on the importance of diet, exercise, and its role in overall health and mortality. The patient's FH and SH was reviewed, including their home life, tobacco status, and drug and alcohol status.   Vaccines: Uptodate with flu and Tdap Pelvic: DVE/pap; last done 2014 after birth of last child. No early family history of colon or breast cancer.  Nonsmoker HIV/STD: refused.

## 2015-05-15 ENCOUNTER — Ambulatory Visit
Admission: RE | Admit: 2015-05-15 | Discharge: 2015-05-15 | Disposition: A | Discharge status: Home / Self Care | Payer: Managed Care, Other (non HMO) | Source: Ambulatory Visit | Attending: Family Medicine | Admitting: Family Medicine

## 2015-05-15 DIAGNOSIS — N920 Excessive and frequent menstruation with regular cycle: Secondary | ICD-10-CM

## 2015-05-15 LAB — CBC WITH DIFFERENTIAL/PLATELET
BASOS: 0 %
Basophils Absolute: 0 10*3/uL (ref 0.0–0.2)
EOS (ABSOLUTE): 0 10*3/uL (ref 0.0–0.4)
EOS: 0 %
HEMATOCRIT: 36.9 % (ref 34.0–46.6)
Hemoglobin: 12.1 g/dL (ref 11.1–15.9)
Immature Grans (Abs): 0 10*3/uL (ref 0.0–0.1)
Immature Granulocytes: 0 %
LYMPHS ABS: 2.8 10*3/uL (ref 0.7–3.1)
Lymphs: 41 %
MCH: 29.2 pg (ref 26.6–33.0)
MCHC: 32.8 g/dL (ref 31.5–35.7)
MCV: 89 fL (ref 79–97)
MONOS ABS: 0.5 10*3/uL (ref 0.1–0.9)
Monocytes: 8 %
NEUTROS ABS: 3.5 10*3/uL (ref 1.4–7.0)
Neutrophils: 51 %
Platelets: 334 10*3/uL (ref 150–379)
RBC: 4.14 x10E6/uL (ref 3.77–5.28)
RDW: 14.6 % (ref 12.3–15.4)
WBC: 6.9 10*3/uL (ref 3.4–10.8)

## 2015-05-15 LAB — TSH: TSH: 6.53 u[IU]/mL — ABNORMAL HIGH (ref 0.450–4.500)

## 2015-05-15 LAB — T3, FREE: T3, Free: 2.8 pg/mL (ref 2.0–4.4)

## 2015-05-15 LAB — T4, FREE: Free T4: 0.93 ng/dL (ref 0.82–1.77)

## 2015-05-16 ENCOUNTER — Telehealth: Payer: Self-pay | Admitting: *Deleted

## 2015-05-16 NOTE — Telephone Encounter (Signed)
Received fax from [facility] requesting  PA for Victoza.  Forms requested from [facility].  Don't see where patient has tried and failed and medications on formulary.  Will place in Dr. Daphine DeutscherBedsole's in box to complete PA.

## 2015-05-17 ENCOUNTER — Telehealth: Payer: Self-pay | Admitting: Family Medicine

## 2015-05-17 LAB — CYTOLOGY - PAP

## 2015-05-17 NOTE — Telephone Encounter (Signed)
Pt states that she is returning your call. Please call - [phone] Thank you

## 2015-05-17 NOTE — Telephone Encounter (Signed)
Refer to result note, pt needs to see GYN--does she need referral?

## 2015-05-20 NOTE — Telephone Encounter (Signed)
[place] notified insurance will not cover Victoza until she has tried and fell a preferred medications.  She is okay with trying Byetta.  Will forward to Dr. Ermalene SearingBedsole to send in new prescription.

## 2015-05-21 ENCOUNTER — Telehealth: Payer: Self-pay | Admitting: Family Medicine

## 2015-05-21 DIAGNOSIS — N858 Other specified noninflammatory disorders of uterus: Secondary | ICD-10-CM

## 2015-05-21 DIAGNOSIS — N920 Excessive and frequent menstruation with regular cycle: Secondary | ICD-10-CM

## 2015-05-21 MED ORDER — EXENATIDE 5 MCG/0.02ML ~~LOC~~ SOPN: 5.0000 ug | PEN_INJECTOR | Freq: Two times a day (BID) | SUBCUTANEOUS | Status: DC

## 2015-05-21 NOTE — Telephone Encounter (Signed)
New rx sent

## 2015-05-21 NOTE — Telephone Encounter (Signed)
-----   Message from Damita Lackonna S Loring, CMA sent at 05/20/2015  2:57 PM EST ----- Phylis BougieVaniqua notified as instructed by telephone.  She is agreeable to GYN referral.  [facility].

## 2015-05-22 ENCOUNTER — Telehealth: Payer: Self-pay

## 2015-05-22 NOTE — Telephone Encounter (Signed)
Received fax from [facility] requesting PA for Byetta.  PA completed on CoverMyMeds.  PA Denied.  Patient must first try a generic oral medication.  List placed in Dr. Daphine DeutscherBedsole's inbox to review.

## 2015-05-22 NOTE — Telephone Encounter (Signed)
Pt left v/m; pt was seen 05/14/15 and was to cb with BP readings; pt request cb. Left v/m requesting pt to cb.

## 2015-05-23 NOTE — Telephone Encounter (Signed)
Left message for Phylis BougieVaniqua to return my call.

## 2015-05-23 NOTE — Telephone Encounter (Signed)
Let pt know thia was for weight loss. Other meds are D meds. Continue working on healthy lifestyle if she does not want to pay out of pocket for either of these.

## 2015-05-23 NOTE — Telephone Encounter (Signed)
Patient returned Donna's call.  Patient can be reached tomorrow before 9 am at [phone].  After 9, you can leave her a message and she'll call back.

## 2015-05-24 ENCOUNTER — Encounter: Payer: Self-pay | Admitting: Family Medicine

## 2015-05-24 NOTE — Telephone Encounter (Signed)
Agreed -

## 2015-05-24 NOTE — Telephone Encounter (Signed)
Left message for Angela [name] to return my call. 

## 2015-05-24 NOTE — Telephone Encounter (Signed)
Best number [phone] Pt returned your call

## 2015-05-24 NOTE — Telephone Encounter (Signed)
Left message for Phylis BougieVaniqua that her insurance will not cover the Victoza or the Byetta.  Per Dr. Ermalene SearingBedsole, she would recommend continue working on a healthy lifestyle or can pay out of pocket for one of these medications.

## 2015-05-24 NOTE — Telephone Encounter (Signed)
Unable to reach patient by phone.  Continuing to have the leave messages.  I ask patient to please send her BP reading to Dr. Ermalene SearingBedsole via MyChart since we are having a hard time reaching her by phone.

## 2015-05-29 ENCOUNTER — Telehealth: Payer: Self-pay | Admitting: Family Medicine

## 2015-05-29 ENCOUNTER — Emergency Department (HOSPITAL_COMMUNITY)
Admission: EM | Admit: 2015-05-29 | Discharge: 2015-05-29 | Disposition: A | Discharge status: Home / Self Care | Payer: Managed Care, Other (non HMO) | Attending: Emergency Medicine | Admitting: Emergency Medicine

## 2015-05-29 ENCOUNTER — Emergency Department (HOSPITAL_COMMUNITY): Payer: Managed Care, Other (non HMO)

## 2015-05-29 ENCOUNTER — Encounter (HOSPITAL_COMMUNITY): Payer: Self-pay | Admitting: *Deleted

## 2015-05-29 DIAGNOSIS — Z862 Personal history of diseases of the blood and blood-forming organs and certain disorders involving the immune mechanism: Secondary | ICD-10-CM | POA: Diagnosis not present

## 2015-05-29 DIAGNOSIS — Z79899 Other long term (current) drug therapy: Secondary | ICD-10-CM | POA: Diagnosis not present

## 2015-05-29 DIAGNOSIS — Z8619 Personal history of other infectious and parasitic diseases: Secondary | ICD-10-CM | POA: Diagnosis not present

## 2015-05-29 DIAGNOSIS — Z7984 Long term (current) use of oral hypoglycemic drugs: Secondary | ICD-10-CM | POA: Diagnosis not present

## 2015-05-29 DIAGNOSIS — Z880 Allergy status to penicillin: Secondary | ICD-10-CM | POA: Insufficient documentation

## 2015-05-29 DIAGNOSIS — Z8679 Personal history of other diseases of the circulatory system: Secondary | ICD-10-CM | POA: Insufficient documentation

## 2015-05-29 DIAGNOSIS — Z8751 Personal history of pre-term labor: Secondary | ICD-10-CM | POA: Insufficient documentation

## 2015-05-29 DIAGNOSIS — R358 Other polyuria: Secondary | ICD-10-CM | POA: Diagnosis not present

## 2015-05-29 DIAGNOSIS — Z8719 Personal history of other diseases of the digestive system: Secondary | ICD-10-CM | POA: Insufficient documentation

## 2015-05-29 DIAGNOSIS — R109 Unspecified abdominal pain: Secondary | ICD-10-CM | POA: Diagnosis not present

## 2015-05-29 DIAGNOSIS — Z8742 Personal history of other diseases of the female genital tract: Secondary | ICD-10-CM | POA: Diagnosis not present

## 2015-05-29 LAB — URINALYSIS, ROUTINE W REFLEX MICROSCOPIC
BILIRUBIN URINE: NEGATIVE
GLUCOSE, UA: NEGATIVE mg/dL
Ketones, ur: NEGATIVE mg/dL
Nitrite: NEGATIVE
PROTEIN: NEGATIVE mg/dL
SPECIFIC GRAVITY, URINE: 1.003 — AB (ref 1.005–1.030)
pH: 7 (ref 5.0–8.0)

## 2015-05-29 LAB — URINE MICROSCOPIC-ADD ON: SQUAMOUS EPITHELIAL / LPF: NONE SEEN

## 2015-05-29 MED ORDER — PHENAZOPYRIDINE HCL 200 MG PO TABS: 200.0000 mg | ORAL_TABLET | Freq: Three times a day (TID) | ORAL | Status: DC

## 2015-05-29 MED ORDER — KETOROLAC TROMETHAMINE 30 MG/ML IJ SOLN
30.0000 mg | Freq: Once | INTRAMUSCULAR | Status: AC
  Administered 2015-05-29: 30 mg via INTRAMUSCULAR
  Filled 2015-05-29: qty 1

## 2015-05-29 NOTE — Telephone Encounter (Signed)
Patient Name: Angela [name] DOB: 26-Apr-1986 Initial Comment Caller states she is having left side pain. She's have Fibroid pain. She was told she has a mass on her Uterus. Benign. Pain goes into her back. Nurse Assessment Nurse: Charna Elizabeth, RN, Cathy Date/Time (Eastern Time): 05/29/2015 10:03:04 AM Confirm and document reason for call. If symptomatic, describe symptoms. You must click the next button to save text entered. ---Caller states she developed left flank pain yesterday that is worse today. No fever. No injury in the past 3 days. Alert and responsive. Has the patient traveled out of the country within the last 30 days? ---No Does the patient have any new or worsening symptoms? ---Yes Will a triage be completed? ---Yes Related visit to physician within the last 2 weeks? ---No Does the PT have any chronic conditions? (i.e. diabetes, asthma, etc.) ---Yes List chronic conditions. ---Over active bladder, Uterine Fibroids Did the patient indicate they were pregnant? ---No Is this a behavioral health or substance abuse call? ---No Guidelines Guideline Title Affirmed Question Affirmed Notes Flank Pain [1] SEVERE pain (e.g., excruciating, scale 8-10) AND [2] present > 1 hour Final Disposition User Go to ED Now Charna Elizabeth, RN, Cathy Referrals [facility] - ED Disagree/Comply: Comply

## 2015-05-29 NOTE — ED Notes (Addendum)
Pt complains of left flank pain since yesterday. Pt states she had a pelvic exam last Wednesday and was told she had benign mass and needed to go to OB/GYN, for which she has appointment next Thursday. Pt states she has had increased urinary frequency since this morning. Pt states she has also had pain after urinating since Monday. Pt denies change in odor or appearance of urine.

## 2015-05-29 NOTE — ED Notes (Signed)
She is ambulatory without difficulty and tells [place] she is much more comfortable.

## 2015-05-29 NOTE — Telephone Encounter (Signed)
Patient Name: °Angela [name] °N °DOB: 10/17/1985 °Initial Comment Caller states she is having left side pain. She's have °Fibroid pain. She was told she has a mass on her °Uterus. Benign. Pain goes into her back. °Nurse Assessment °Nurse: Trumbull, RN, Cathy Date/Time (Eastern Time): 05/29/2015 10:03:04 AM °Confirm and document reason for call. If symptomatic, °describe symptoms. You must click the next button to save text °entered. °---Caller states she developed left flank pain yesterday that is °worse today. No fever. No injury in the past 3 days. Alert and °responsive. °Has the patient traveled out of the country within the last 30 °days? °---No °Does the patient have any new or worsening symptoms? ---Yes °Will a triage be completed? ---Yes °Related visit to physician within the last 2 weeks? ---No °Does the PT have any chronic conditions? (i.e. diabetes, °asthma, etc.) °---Yes °List chronic conditions. ---Over active bladder, Uterine Fibroids °Did the patient indicate they were pregnant? ---No °Is this a behavioral health or substance abuse call? ---No °Guidelines °Guideline Title Affirmed Question Affirmed Notes °Flank Pain [1] SEVERE pain (e.g., excruciating, scale °8-10) AND [2] present > 1 hour °Final Disposition User °Go to ED Now Trumbull, RN, Cathy °Referrals °[facility] - ED °Disagree/Comply: Comply °

## 2015-05-29 NOTE — Discharge Instructions (Signed)
As discussed, your evaluation today has been largely reassuring.  But, it is important that you monitor your condition carefully, and do not hesitate to return to the ED if you develop new, or concerning changes in your condition.  Your symptoms may be due to either a passed kidney stone, or cystitis, which is inflammation of the bladder and urinary tract.  Please follow-up with your physician for appropriate ongoing care.

## 2015-05-29 NOTE — ED Provider Notes (Signed)
CSN: [id number]     Arrival date & time 05/29/15  1143 History   First MD Initiated Contact with Patient 05/29/15 1248     Chief Complaint  Patient presents with  . Flank Pain    HPI  Patient presents with concern of left flank pain. Pain began about one day ago, since onset has been intermittent, with no clear alleviating, exacerbating, precipitating factors. Pain is focally in the left flank, with no radiation. There is no ongoing dysuria, hematuria, though there is polyuria. No fever, chills, nausea, vomiting Patient saw her physician one week ago, had a pelvic exam, was referred to OB/GYN. She has not yet seen them, but sees them in one week. Patient denies likelihood of current pregnancy.   Past Medical History  Diagnosis Date  . Ovarian cyst rupture   . Abnormal Pap smear AGE 30    COLPO LAST PAP 08/2011  . Preterm labor   . Infection AGE 30    CHLAMYDIA; GONORRHEA  . 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 Infection     BV  . Complication of anesthesia     NAUSEA; VOMITING; ITCHING; DIFFICULTY BREATHING  . PONV (postoperative nausea and vomiting)   . Anemia   . GERD (gastroesophageal reflux disease) AGE 30    tums prn  . History of chicken pox   . Migraines   . Urinary incontinence   . Genital herpes    Past Surgical History  Procedure Laterality Date  . Cervix lesion destruction    . Wisdom tooth extraction    . Cesarean section  2005, 2010    x2  . Colposcopy    . Cesarean section with bilateral tubal ligation Bilateral 11/15/2012    Procedure: REPEAT CESAREAN SECTION WITH BILATERAL TUBAL LIGATION;  Surgeon: Kirkland Hun, MD;  Location: [facility];  Service: Obstetrics;  Laterality: Bilateral;  . Unilateral salpingectomy Right 11/15/2012    Procedure: UNILATERAL SALPINGECTOMY;  Surgeon: Kirkland Hun, MD;  Location: [facility];  Service: Obstetrics;  Laterality: Right;   Family History  Problem Relation Age of Onset  . Anesthesia problems Mother     NAUSEA AND VOMITING   . Thyroid disease Mother   . Fibromyalgia Mother   . Sarcoidosis Mother   . Depression Mother   . Other Mother     VARICOSE VEINS  . Anesthesia problems Other   . Diabetes Father   . Hypertension Sister   . Cancer Maternal Grandfather     prostate  . Depression Maternal Aunt   . Sarcoidosis Maternal Aunt   . Arthritis Maternal Grandmother   . Heart disease Maternal Grandmother   . Hypertension Maternal Grandmother   . Kidney disease Maternal Grandmother     FAILURE  . Asthma Cousin    Social History  Substance Use Topics  . Smoking status: Never Smoker   . Smokeless tobacco: Never Used  . Alcohol Use: 1.8 oz/week    2 Glasses of wine, 1 Shots of liquor per week     Comment: weekends but none since pregnancy   OB History    Gravida Para Term Preterm AB TAB SAB Ectopic Multiple Living   Obstetric Comments   2005 HYPEREMESIS 2010 MECONIUM ASPIRATION     Review of Systems  Constitutional:       Per HPI, otherwise negative  HENT:       Per HPI, otherwise negative  Respiratory:       Per HPI, otherwise negative  Cardiovascular:       Per HPI, otherwise negative  Gastrointestinal: Negative for vomiting.  Endocrine:       Negative aside from HPI  Genitourinary:       Neg aside from HPI   Musculoskeletal:       Per HPI, otherwise negative  Skin: Negative.   Neurological: Negative for syncope.      Allergies  Amoxicillin  Home Medications   Prior to Admission medications   Medication Sig Start Date End Date Taking? Authorizing Provider  ibuprofen (ADVIL,MOTRIN) 200 MG tablet Take 600 mg by mouth every 6 (six) hours as needed for headache or moderate pain.    Yes Historical Provider, MD  tolterodine (DETROL LA) 4 MG 24 hr capsule Take 1 capsule (4 mg total) by mouth daily. 04/29/15  Yes Amy Michelle Nasuti, MD  valACYclovir (VALTREX) 1000 MG tablet Take 1 tablet (1,000 mg total) by mouth 2 (two) times daily. Patient taking differently: Take  1,000 mg by mouth 2 (two) times daily as needed (outbreaks).  04/09/15  Yes Amy Michelle Nasuti, MD  exenatide (BYETTA) 5 MCG/0.02ML SOPN injection Inject 0.02 mLs (5 mcg total) into the skin 2 (two) times daily with a meal. 05/21/15   Amy E Bedsole, MD   BP 145/97 mmHg  Pulse 84  Temp(Src) 98.3 F (36.8 C) (Oral)  Resp 14  SpO2 100%  LMP 05/23/2015 Physical Exam  Constitutional: She is oriented to person, place, and time. She appears well-developed and well-nourished. No distress.  HENT:  Head: Normocephalic and atraumatic.  Eyes: Conjunctivae and EOM are normal.  Cardiovascular: Normal rate and regular rhythm.   Pulmonary/Chest: Effort normal and breath sounds normal. No stridor. No respiratory distress.  Abdominal: She exhibits no distension. There is tenderness.  Mild tenderness to palpation about the left flank, no other abdominal pain, deformity  Musculoskeletal: She exhibits no edema.  Neurological: She is alert and oriented to person, place, and time. No cranial nerve deficit.  Skin: Skin is warm and dry.  Psychiatric: She has a normal mood and affect.  Nursing note and vitals reviewed.   ED Course  Procedures (including critical care time) Labs Review Labs Reviewed  URINALYSIS, ROUTINE W REFLEX MICROSCOPIC (NOT AT [facility]) - Abnormal; Notable for the following:    Specific Gravity, Urine 1.003 (*)    Hgb urine dipstick LARGE (*)    Leukocytes, UA SMALL (*)    All other components within normal limits  URINE MICROSCOPIC-ADD ON - Abnormal; Notable for the following:    Bacteria, UA RARE (*)    All other components within normal limits    After the initial evaluation, with some concern for kidney stone patient had CT scan performed.    Imaging Review Ct Renal Stone Study  05/29/2015  CLINICAL DATA:  Left flank pain for 1 day. Urinary frequency. History of unilateral salpingectomy. EXAM: CT ABDOMEN AND PELVIS WITHOUT CONTRAST TECHNIQUE: Multidetector CT imaging of the abdomen  and pelvis was performed following the standard protocol without IV contrast. COMPARISON:  Pelvic ultrasound 05/15/2015. FINDINGS: Lower chest: Clear lung bases. No significant pleural or pericardial effusion. Hepatobiliary: As evaluated in the noncontrast state, the liver appears unremarkable without focal abnormality. No evidence of gallstones, gallbladder wall thickening or biliary dilatation. Pancreas: Unremarkable. No pancreatic ductal dilatation or surrounding inflammatory changes. Spleen: Normal in size without focal abnormality. Adrenals/Urinary Tract: Both adrenal glands appear normal. No evidence of urinary  tract calculus or hydronephrosis. Both kidneys appear unremarkable as imaged in the noncontrast state. The bladder appears unremarkable. Stomach/Bowel: No evidence of bowel wall thickening, distention or surrounding inflammatory change. The colon is collapsed. The appendix appears normal. Vascular/Lymphatic: There are no enlarged abdominal or pelvic lymph nodes. No significant vascular findings on noncontrast imaging. Reproductive: No evidence of adnexal mass. Surgical clips or calcifications in both adnexa. Low-density anteriorly in the lower uterine segment, likely related to previous C-section. Other: No evidence of abdominal wall mass or hernia. Musculoskeletal: No acute or significant osseous findings. IMPRESSION: No evidence of urinary tract calculus, hydronephrosis or acute abdominal pelvic process. Electronically Signed   By: Carey Bullocks M.D.   On: 05/29/2015 13:43   I have personally reviewed and evaluated these images and lab results as part of my medical decision-making.     2:57 PM Patient states that she feels substantially better, no ongoing complaints per We discussed all findings at length, including reassuring CT, and suspicion for passed kidney stone.  MDM  Patient presents with left flank pain. Here the patient is awake, alert, hemodynamically stable, in no  distress. Following provision of anti-inflammatory medicine, the patient has no ongoing complaints. Symptoms maybe secondary to ureteral spasm, passed kidney stone, as the patient has hematuria. Cystitis is also a consideration, and the patient will be started on Pyridium, pending outpatient follow-up, urine culture results.   Gerhard Munch, MD 05/29/15 1501

## 2015-05-31 NOTE — Telephone Encounter (Signed)
Noted  

## 2015-06-06 ENCOUNTER — Encounter: Payer: Self-pay | Admitting: Family Medicine

## 2015-06-07 MED ORDER — LOSARTAN POTASSIUM 50 MG PO TABS: 50.0000 mg | ORAL_TABLET | Freq: Every day | ORAL | Status: DC

## 2015-06-11 ENCOUNTER — Encounter: Payer: Self-pay | Admitting: Family Medicine

## 2015-06-11 ENCOUNTER — Ambulatory Visit (INDEPENDENT_AMBULATORY_CARE_PROVIDER_SITE_OTHER): Payer: Managed Care, Other (non HMO) | Admitting: Family Medicine

## 2015-06-11 VITALS — BP 116/80 | HR 92 | Temp 98.7°F | Ht 63.25 in | Wt 174.8 lb

## 2015-06-11 DIAGNOSIS — I10 Essential (primary) hypertension: Secondary | ICD-10-CM

## 2015-06-11 DIAGNOSIS — E669 Obesity, unspecified: Secondary | ICD-10-CM

## 2015-06-11 MED ORDER — PHENTERMINE HCL 15 MG PO CAPS: 15.0000 mg | ORAL_CAPSULE | ORAL | Status: DC

## 2015-06-11 NOTE — Assessment & Plan Note (Signed)
Now at goal on cozaar. No SE. Pt needs to have BMET checked at follow up in 2 weeks.

## 2015-06-11 NOTE — Assessment & Plan Note (Signed)
Insurance does not cover byetta, victoza, nutritionist.  She has made aggressive changes with diet and prior to this was exercising 4 times a week x 6 months with no weight loss.  Will try trial of phentermine carefully given BP issues. Follow up closely in 2 weeks.

## 2015-06-11 NOTE — Patient Instructions (Addendum)
Get back to regular exercise. Consider getting a pedometer to count steps.  Start phentermine low dose.  Follow BP daily. Call if BP > 140/90 on new medication.  Follow up in 2 week for BP check and BMET lab for new BP med.Marland Kitchen

## 2015-06-11 NOTE — Progress Notes (Signed)
Pre visit review using our clinic review tool, if applicable. No additional management support is needed unless otherwise documented below in the visit note. 

## 2015-06-11 NOTE — Progress Notes (Signed)
   Subjective:    Patient ID: Angela [name], female    DOB: 01-26-1986, 30 y.o.   MRN: [id number]  HPI 30 year old female presents to discuss weight management and HTN follow up.   Insurance did not cover the byetta or the victoza.  She is working on diet changes, healthy lifestyle. She is doing [employer]. Insurance did not cover nutritionist.  Exercise: minimal exercise due to job.  Drinking a lot of water.  Wt Readings from Last 3 Encounters:  06/11/15 174 lb 12 oz (79.266 kg)  05/14/15 174 lb (78.926 kg)  02/14/15 176 lb 8 oz (80.06 kg)    Body mass index is 30.69 kg/(m^2).  Hypertension:   Well controlled on cozaar in last few days. BP Readings from Last 3 Encounters:  06/11/15 116/80  05/29/15 145/97  05/14/15 160/104  Using medication without problems or lightheadedness:  None Chest pain with exertion:None Edema:None Short of breath:None Average home BPs: 111/73 Other issues:    Review of Systems  Constitutional: Negative for fever and fatigue.  HENT: Negative for ear pain.   Eyes: Negative for pain.  Respiratory: Negative for chest tightness and shortness of breath.   Cardiovascular: Negative for chest pain, palpitations and leg swelling.  Gastrointestinal: Negative for abdominal pain.  Genitourinary: Negative for dysuria.       Objective:   Physical Exam  Constitutional: Vital signs are normal. She appears well-developed and well-nourished. She is cooperative.  Non-toxic appearance. She does not appear ill. No distress.  HENT:  Head: Normocephalic.  Right Ear: Hearing, tympanic membrane, external ear and ear canal normal. Tympanic membrane is not erythematous, not retracted and not bulging.  Left Ear: Hearing, tympanic membrane, external ear and ear canal normal. Tympanic membrane is not erythematous, not retracted and not bulging.  Nose: No mucosal edema or rhinorrhea. Right sinus exhibits no maxillary sinus tenderness and no frontal sinus  tenderness. Left sinus exhibits no maxillary sinus tenderness and no frontal sinus tenderness.  Mouth/Throat: Uvula is midline, oropharynx is clear and moist and mucous membranes are normal.  Eyes: Conjunctivae, EOM and lids are normal. Pupils are equal, round, and reactive to light. Lids are everted and swept, no foreign bodies found.  Neck: Trachea normal and normal range of motion. Neck supple. Carotid bruit is not present. No thyroid mass and no thyromegaly present.  Cardiovascular: Normal rate, regular rhythm, S1 normal, S2 normal, normal heart sounds, intact distal pulses and normal pulses.  Exam reveals no gallop and no friction rub.   No murmur heard. Pulmonary/Chest: Effort normal and breath sounds normal. No tachypnea. No respiratory distress. She has no decreased breath sounds. She has no wheezes. She has no rhonchi. She has no rales.  Abdominal: Soft. Normal appearance and bowel sounds are normal. There is no tenderness.  Neurological: She is alert.  Skin: Skin is warm, dry and intact. No rash noted.  Psychiatric: Her speech is normal and behavior is normal. Judgment and thought content normal. Her mood appears not anxious. Cognition and memory are normal. She does not exhibit a depressed mood.          Assessment & Plan:

## 2015-06-25 ENCOUNTER — Ambulatory Visit (INDEPENDENT_AMBULATORY_CARE_PROVIDER_SITE_OTHER): Payer: Managed Care, Other (non HMO) | Admitting: Family Medicine

## 2015-06-25 ENCOUNTER — Encounter: Payer: Self-pay | Admitting: Family Medicine

## 2015-06-25 VITALS — BP 143/98 | HR 76 | Temp 98.5°F | Ht 63.25 in | Wt 172.2 lb

## 2015-06-25 DIAGNOSIS — I10 Essential (primary) hypertension: Secondary | ICD-10-CM

## 2015-06-25 DIAGNOSIS — E669 Obesity, unspecified: Secondary | ICD-10-CM | POA: Diagnosis not present

## 2015-06-25 NOTE — Assessment & Plan Note (Signed)
Follow BP as starting phentermine. Encouraged exercise, weight loss, healthy eating habits.

## 2015-06-25 NOTE — Patient Instructions (Addendum)
Stop at lab on way out for BMET. Conitnue cozaar. Follow BP very closely, if > 140/90 or HR > 100.  Continue working on weight loss.

## 2015-06-25 NOTE — Assessment & Plan Note (Signed)
Elevated given did not take med today. Otherwise well controlled. Will send for BMET

## 2015-06-25 NOTE — Progress Notes (Signed)
   Subjective:    Patient ID: Angela [name], female    DOB: [birth year], 30 y.o.   MRN: [id number]  HPI   30 year old female presents for 2 week follow up blood pressure.    BP is now elevated but she has not take n her BP medicaiton today yet.Marland Kitchen Usually takes at lunch.  At home measurements have been 114-133/78-80  She is due for BMET eval on cozaar. BP Readings from Last 3 Encounters:  06/25/15 143/98  06/11/15 116/80  05/29/15 145/97    Wt Readings from Last 3 Encounters:  06/25/15 172 lb 4 oz (78.132 kg)  06/11/15 174 lb 12 oz (79.266 kg)  05/14/15 174 lb (78.926 kg)   She is still doing well with healthy eating, planning on starting regular exercise back.. Going to daily. Planning to start regularly.    Review of Systems  Constitutional: Negative for fever and fatigue.  HENT: Negative for ear pain.   Eyes: Negative for pain.  Respiratory: Negative for chest tightness and shortness of breath.   Cardiovascular: Negative for chest pain, palpitations and leg swelling.  Gastrointestinal: Negative for abdominal pain.  Genitourinary: Negative for dysuria.       Objective:   Physical Exam  Constitutional: Vital signs are normal. She appears well-developed and well-nourished. She is cooperative.  Non-toxic appearance. She does not appear ill. No distress.  HENT:  Head: Normocephalic.  Right Ear: Hearing, tympanic membrane, external ear and ear canal normal. Tympanic membrane is not erythematous, not retracted and not bulging.  Left Ear: Hearing, tympanic membrane, external ear and ear canal normal. Tympanic membrane is not erythematous, not retracted and not bulging.  Nose: No mucosal edema or rhinorrhea. Right sinus exhibits no maxillary sinus tenderness and no frontal sinus tenderness. Left sinus exhibits no maxillary sinus tenderness and no frontal sinus tenderness.  Mouth/Throat: Uvula is midline, oropharynx is clear and moist and mucous membranes are normal.  Eyes:  Conjunctivae, EOM and lids are normal. Pupils are equal, round, and reactive to light. Lids are everted and swept, no foreign bodies found.  Neck: Trachea normal and normal range of motion. Neck supple. Carotid bruit is not present. No thyroid mass and no thyromegaly present.  Cardiovascular: Normal rate, regular rhythm, S1 normal, S2 normal, normal heart sounds, intact distal pulses and normal pulses.  Exam reveals no gallop and no friction rub.   No murmur heard. Pulmonary/Chest: Effort normal and breath sounds normal. No tachypnea. No respiratory distress. She has no decreased breath sounds. She has no wheezes. She has no rhonchi. She has no rales.  Abdominal: Soft. Normal appearance and bowel sounds are normal. There is no tenderness.  Neurological: She is alert.  Skin: Skin is warm, dry and intact. No rash noted.  Psychiatric: Her speech is normal and behavior is normal. Judgment and thought content normal. Her mood appears not anxious. Cognition and memory are normal. She does not exhibit a depressed mood.          Assessment & Plan:

## 2015-06-25 NOTE — Progress Notes (Signed)
Pre visit review using our clinic review tool, if applicable. No additional management support is needed unless otherwise documented below in the visit note. 

## 2015-06-27 LAB — BASIC METABOLIC PANEL
BUN/Creatinine Ratio: 10 (ref 8–20)
BUN: 7 mg/dL (ref 6–20)
CALCIUM: 9.3 mg/dL (ref 8.7–10.2)
CO2: 20 mmol/L (ref 18–29)
CREATININE: 0.69 mg/dL (ref 0.57–1.00)
Chloride: 100 mmol/L (ref 96–106)
GFR, EST AFRICAN AMERICAN: 135 mL/min/{1.73_m2} (ref >59)
GFR, EST NON AFRICAN AMERICAN: 117 mL/min/{1.73_m2} (ref >59)
Glucose: 87 mg/dL (ref 65–99)
Potassium: 4.3 mmol/L (ref 3.5–5.2)
Sodium: 138 mmol/L (ref 134–144)

## 2015-06-28 ENCOUNTER — Other Ambulatory Visit: Payer: Self-pay | Admitting: Obstetrics and Gynecology

## 2015-08-08 ENCOUNTER — Other Ambulatory Visit: Payer: Self-pay | Admitting: *Deleted

## 2015-08-08 MED ORDER — LOSARTAN POTASSIUM 50 MG PO TABS
50.0000 mg | ORAL_TABLET | Freq: Every day | ORAL | Status: DC
Start: 2015-08-08 — End: 2016-05-05

## 2015-08-27 ENCOUNTER — Ambulatory Visit: Payer: Managed Care, Other (non HMO) | Admitting: Family Medicine

## 2015-08-27 DIAGNOSIS — Z0289 Encounter for other administrative examinations: Secondary | ICD-10-CM

## 2015-09-03 ENCOUNTER — Ambulatory Visit (INDEPENDENT_AMBULATORY_CARE_PROVIDER_SITE_OTHER): Payer: Managed Care, Other (non HMO) | Admitting: Family Medicine

## 2015-09-03 ENCOUNTER — Encounter: Payer: Self-pay | Admitting: Family Medicine

## 2015-09-03 VITALS — BP 130/86 | HR 92 | Temp 98.5°F | Ht 63.25 in | Wt 171.0 lb

## 2015-09-03 DIAGNOSIS — E669 Obesity, unspecified: Secondary | ICD-10-CM

## 2015-09-03 DIAGNOSIS — I10 Essential (primary) hypertension: Secondary | ICD-10-CM

## 2015-09-03 MED ORDER — PHENTERMINE HCL 15 MG PO CAPS: 15.0000 mg | ORAL_CAPSULE | ORAL | Status: DC

## 2015-09-03 NOTE — Progress Notes (Signed)
30 year old female presents for 2 month follow up blood pressure and obesity.  Hypertension:  Well controlled on cozaar BP Readings from Last 3 Encounters:  09/03/15 130/86  06/25/15 143/98  06/11/15 116/80  Using medication without problems or lightheadedness: None Chest pain with exertion:none Edema:none Short of breath:none Average home BPs: 120/80s Other issues:  She was phentermine for weight loss but has run out since 08/14/2014.When she was on.. o heart racing, no difficulty sleeping, no anxiety.  She is still doing well with healthy eating, started regular exercise back. Basketball, walking with stroller.  she is doing smoothie cleanse right now. Lots of fruit and greens.  Wt Readings from Last 3 Encounters:  09/03/15 171 lb (77.565 kg)  06/25/15 172 lb 4 oz (78.132 kg)  06/11/15 174 lb 12 oz (79.266 kg)      Review of Systems  Constitutional: Negative for fever and fatigue.  HENT: Negative for ear pain.  Eyes: Negative for pain.  Respiratory: Negative for chest tightness and shortness of breath.  Cardiovascular: Negative for chest pain, palpitations and leg swelling.  Gastrointestinal: Negative for abdominal pain.  Genitourinary: Negative for dysuria.       Objective:   Physical Exam  Constitutional: Vital signs are normal. She appears well-developed and well-nourished. She is cooperative. Non-toxic appearance. She does not appear ill. No distress.  HENT:  Head: Normocephalic.  Right Ear: Hearing, tympanic membrane, external ear and ear canal normal. Tympanic membrane is not erythematous, not retracted and not bulging.  Left Ear: Hearing, tympanic membrane, external ear and ear canal normal. Tympanic membrane is not erythematous, not retracted and not bulging.  Nose: No mucosal edema or rhinorrhea. Right sinus exhibits no maxillary sinus tenderness and no frontal sinus tenderness. Left sinus exhibits no maxillary sinus tenderness and no frontal sinus  tenderness.  Mouth/Throat: Uvula is midline, oropharynx is clear and moist and mucous membranes are normal.  Eyes: Conjunctivae, EOM and lids are normal. Pupils are equal, round, and reactive to light. Lids are everted and swept, no foreign bodies found.  Neck: Trachea normal and normal range of motion. Neck supple. Carotid bruit is not present. No thyroid mass and no thyromegaly present.  Cardiovascular: Normal rate, regular rhythm, S1 normal, S2 normal, normal heart sounds, intact distal pulses and normal pulses. Exam reveals no gallop and no friction rub.  No murmur heard. Pulmonary/Chest: Effort normal and breath sounds normal. No tachypnea. No respiratory distress. She has no decreased breath sounds. She has no wheezes. She has no rhonchi. She has no rales.  Abdominal: Soft. Normal appearance and bowel sounds are normal. There is no tenderness.  Neurological: She is alert.  Skin: Skin is warm, dry and intact. No rash noted.  Psychiatric: Her speech is normal and behavior is normal. Judgment and thought content normal. Her mood appears not anxious. Cognition and memory are normal. She does not exhibit a depressed mood.

## 2015-09-03 NOTE — Assessment & Plan Note (Signed)
Excellent control on cozaar despite weight loss medication.

## 2015-09-03 NOTE — Patient Instructions (Addendum)
Restart low dose phentermine.  Follow blood pressure at home.  Keep working on healthy eating, continue exercise 3-5 times a week.

## 2015-09-03 NOTE — Progress Notes (Signed)
Pre visit review using our clinic review tool, if applicable. No additional management support is needed unless otherwise documented below in the visit note. 

## 2015-09-03 NOTE — Assessment & Plan Note (Signed)
Improvement with lifestyle changes, trying to be more active and continue healthy eating. Restart phentermine ( no SE)

## 2015-09-12 ENCOUNTER — Encounter: Payer: Self-pay | Admitting: Family Medicine

## 2015-09-17 ENCOUNTER — Encounter: Payer: Self-pay | Admitting: Family Medicine

## 2015-09-17 NOTE — Telephone Encounter (Signed)
Letter placed up front for pick up

## 2015-09-17 NOTE — Telephone Encounter (Signed)
Please put the letter ant front desk. Pt notified though mychart already to pick up.

## 2015-09-20 ENCOUNTER — Other Ambulatory Visit: Payer: Self-pay | Admitting: Family Medicine

## 2015-09-20 MED ORDER — VALACYCLOVIR HCL 1 G PO TABS: 1000.0000 mg | ORAL_TABLET | Freq: Two times a day (BID) | ORAL | Status: DC

## 2015-09-20 NOTE — Telephone Encounter (Signed)
Valtrex last refilled # 20 on 04/09/15. Pt last seen 09/03/15.

## 2015-10-23 ENCOUNTER — Telehealth: Payer: Self-pay | Admitting: Family Medicine

## 2015-10-23 NOTE — Telephone Encounter (Signed)
Spouse dropped off fmla paperwork from [employer] Spouse stated is was for frequent urination  Needing more bathroom breaks In dr Ermalene Searingbedsole in box for review and signature

## 2015-10-24 NOTE — Telephone Encounter (Signed)
In dr Ermalene Searingbedsole in box For review and signature

## 2015-10-24 NOTE — Telephone Encounter (Signed)
Completed in outbox.

## 2015-10-25 ENCOUNTER — Other Ambulatory Visit: Payer: Self-pay | Admitting: Family Medicine

## 2015-10-25 ENCOUNTER — Encounter: Payer: Self-pay | Admitting: Family Medicine

## 2015-10-25 DIAGNOSIS — Z7689 Persons encountering health services in other specified circumstances: Secondary | ICD-10-CM

## 2015-10-25 MED ORDER — VALACYCLOVIR HCL 1 G PO TABS: 1000.0000 mg | ORAL_TABLET | Freq: Two times a day (BID) | ORAL | Status: DC

## 2015-10-25 NOTE — Telephone Encounter (Signed)
Paperwork faxed to [employer] Left message letting pt know paperwork had been faxed and a copy was here for her  Copy for pt Copy for file Copy for scan Copy for billing

## 2015-10-25 NOTE — Telephone Encounter (Signed)
Last office visit 09/03/2015.  Last refilled 09/20/2015 for #20 with no refills.  Ok to refill?

## 2015-10-30 NOTE — Telephone Encounter (Signed)
In dr Ermalene Searingbedsole in box

## 2015-10-30 NOTE — Telephone Encounter (Signed)
[employer] faxed over paperwork They need additional information

## 2015-11-05 NOTE — Telephone Encounter (Signed)
Dr Ermalene Searingbedsole wanted to know what pt needs paperwork to say  Left message asking pt to call office

## 2015-11-06 NOTE — Telephone Encounter (Signed)
Spoke with pt She stated she goes 10 daily 5 min each time Paperwork faxed to [employer] Copy for file Copy for scan

## 2015-11-06 NOTE — Telephone Encounter (Signed)
Left message asking pt to call office  °

## 2015-11-06 NOTE — Telephone Encounter (Signed)
Returned pt call  

## 2015-12-03 ENCOUNTER — Ambulatory Visit: Payer: Managed Care, Other (non HMO) | Admitting: Family Medicine

## 2015-12-06 ENCOUNTER — Encounter: Payer: Self-pay | Admitting: Family Medicine

## 2015-12-06 ENCOUNTER — Ambulatory Visit (INDEPENDENT_AMBULATORY_CARE_PROVIDER_SITE_OTHER): Payer: Managed Care, Other (non HMO) | Admitting: Family Medicine

## 2015-12-06 ENCOUNTER — Telehealth: Payer: Self-pay

## 2015-12-06 DIAGNOSIS — E669 Obesity, unspecified: Secondary | ICD-10-CM | POA: Diagnosis not present

## 2015-12-06 DIAGNOSIS — I10 Essential (primary) hypertension: Secondary | ICD-10-CM | POA: Diagnosis not present

## 2015-12-06 MED ORDER — PHENTERMINE HCL 15 MG PO CAPS: 15.0000 mg | ORAL_CAPSULE | ORAL | 2 refills | Status: DC

## 2015-12-06 NOTE — Progress Notes (Signed)
30 year old female presents for32 month follow up blood pressure and obesity.  Hypertension:      Well controlled on cozaar BP Readings from Last 3 Encounters:  12/06/15 110/70  09/03/15 130/86  06/25/15 (!) 143/98  Using medication without problems or lightheadedness: None Chest pain with exertion:none Edema:none Short of breath:none Average home BPs: 117/80s Other issues:  She was started back on  phentermine 3 months ago for weight loss No heart racing, no difficulty sleeping, no anxiety. She has been under extreme stress at [facility] in last week. Encouraged her to speack with her pastor. Wt Readings from Last 3 Encounters:  12/06/15 166 lb 8 oz (75.5 kg)  09/03/15 171 lb (77.6 kg)  06/25/15 172 lb 4 oz (78.1 kg)   She is still doing well with healthy eating. Working on fruits and veggies.  She has been doing steps at work. Basketball, walking with stroller with kids.   Review of Systems  Constitutional: Negative for fever and fatigue.  HENT: Negative for ear pain.  Eyes: Negative for pain.  Respiratory: Negative for chest tightness and shortness of breath.  Cardiovascular: Negative for chest pain, palpitations and leg swelling.  Gastrointestinal: Negative for abdominal pain.  Genitourinary: Negative for dysuria.       Objective:   Physical Exam  Constitutional: Vital signs are normal. She appears well-developed and well-nourished. She is cooperative. Non-toxic appearance. She does not appear ill. No distress.  HENT:  Head: Normocephalic.  Right Ear: Hearing, tympanic membrane, external ear and ear canal normal. Tympanic membrane is not erythematous, not retracted and not bulging.  Left Ear: Hearing, tympanic membrane, external ear and ear canal normal. Tympanic membrane is not erythematous, not retracted and not bulging.  Nose: No mucosal edema or rhinorrhea. Right sinus exhibits no maxillary sinus tenderness and no frontal sinus tenderness. Left sinus  exhibits no maxillary sinus tenderness and no frontal sinus tenderness.  Mouth/Throat: Uvula is midline, oropharynx is clear and moist and mucous membranes are normal.  Eyes: Conjunctivae, EOM and lids are normal. Pupils are equal, round, and reactive to light. Lids are everted and swept, no foreign bodies found.  Neck: Trachea normal and normal range of motion. Neck supple. Carotid bruit is not present. No thyroid mass and no thyromegaly present.  Cardiovascular: Normal rate, regular rhythm, S1 normal, S2 normal, normal heart sounds, intact distal pulses and normal pulses. Exam reveals no gallop and no friction rub.  No murmur heard. Pulmonary/Chest: Effort normal and breath sounds normal. No tachypnea. No respiratory distress. She has no decreased breath sounds. She has no wheezes. She has no rhonchi. She has no rales.  Abdominal: Soft. Normal appearance and bowel sounds are normal. There is no tenderness.  Neurological: She is alert.  Skin: Skin is warm, dry and intact. No rash noted.  Psychiatric: Her speech is normal and behavior is normal. Judgment and thought content normal. Her mood appears not anxious. Cognition and memory are normal. She does not exhibit a depressed mood.

## 2015-12-06 NOTE — Telephone Encounter (Signed)
OK to right appt for today for OV.

## 2015-12-06 NOTE — Telephone Encounter (Signed)
Pt left v/m requesting note to be out of work today. Pt was seen earlier today. I spoke with pt and note at front desk for pick up to excuse from work today. FYI to Dr Ermalene Searing.

## 2015-12-06 NOTE — Assessment & Plan Note (Signed)
Well controlled. Continue current medication.  

## 2015-12-06 NOTE — Progress Notes (Signed)
Pre visit review using our clinic review tool, if applicable. No additional management support is needed unless otherwise documented below in the visit note. 

## 2015-12-06 NOTE — Patient Instructions (Addendum)
Start adding [employer] class. Keep up with water intake. Keep up healthy eating. Call if needing referral to counselor for stress management.

## 2015-12-06 NOTE — Assessment & Plan Note (Signed)
Body mass index is 29.26 kg/m.  Continue phentermine, no SE.

## 2016-01-20 ENCOUNTER — Encounter (HOSPITAL_COMMUNITY): Payer: Self-pay | Admitting: Family Medicine

## 2016-01-20 ENCOUNTER — Ambulatory Visit (HOSPITAL_COMMUNITY)
Admission: EM | Admit: 2016-01-20 | Discharge: 2016-01-20 | Disposition: A | Discharge status: Home / Self Care | Payer: Managed Care, Other (non HMO) | Attending: Family Medicine | Admitting: Family Medicine

## 2016-01-20 ENCOUNTER — Telehealth: Payer: Self-pay | Admitting: Family Medicine

## 2016-01-20 DIAGNOSIS — R000 Tachycardia, unspecified: Secondary | ICD-10-CM

## 2016-01-20 LAB — POCT I-STAT, CHEM 8
BUN: 4 mg/dL — ABNORMAL LOW (ref 6–20)
CHLORIDE: 102 mmol/L (ref 101–111)
Calcium, Ion: 1.17 mmol/L (ref 1.15–1.40)
Creatinine, Ser: 0.7 mg/dL (ref 0.44–1.00)
Glucose, Bld: 108 mg/dL — ABNORMAL HIGH (ref 65–99)
HEMATOCRIT: 42 % (ref 36.0–46.0)
HEMOGLOBIN: 14.3 g/dL (ref 12.0–15.0)
POTASSIUM: 3.7 mmol/L (ref 3.5–5.1)
Sodium: 139 mmol/L (ref 135–145)
TCO2: 24 mmol/L (ref 0–100)

## 2016-01-20 NOTE — Discharge Instructions (Signed)
Call your doctor in am to discuss further eval.

## 2016-01-20 NOTE — Telephone Encounter (Signed)
[facility].

## 2016-01-20 NOTE — ED Triage Notes (Signed)
Pt here for the feeling of her heart racing today after lunch. sts that she ate her lunch, took her BP meds and then felt flushed and heart started pounding. sts he heart has been racing since. sts she felt light headed.

## 2016-01-20 NOTE — Telephone Encounter (Signed)
Patient Name: Angela [name]  DOB: 11/28/1985    Initial Comment Caller states she feels like she is in a dream and pulse is 118, shaking, and headache.    Nurse Assessment  Nurse: Stefano GaulStringer, RN, Vera Date/Time (Eastern Time): 01/20/2016 1:57:56 PM  Confirm and document reason for call. If symptomatic, describe symptoms. You must click the next button to save text entered. ---Caller states she is at work. She has tremors in her hands. She can not focus. Pulse 118. No headache. She is light headed.  Has the patient traveled out of the country within the last 30 days? ---Not Applicable  Does the patient have any new or worsening symptoms? ---Yes  Will a triage be completed? ---Yes  Related visit to physician within the last 2 weeks? ---No  Does the PT have any chronic conditions? (i.e. diabetes, asthma, etc.) ---Yes  List chronic conditions. ---HTN  Is the patient pregnant or possibly pregnant? (Ask all females between the ages of 4512-55) ---No  Is this a behavioral health or substance abuse call? ---No     Guidelines    Guideline Title Affirmed Question Affirmed Notes  Dizziness - Lightheadedness Taking a medicine that could cause dizziness (e.g., blood pressure medications, diuretics)    Final Disposition User   Go to ED Now (or PCP triage) Stefano GaulStringer, RN, Vera    Comments  attempted primary number and phone rang with no answer and then recording states that service is temporarily unavailable. Will try secondary number  Triage outcome upgraded to go to ER now (or PCP triage) as she is lightheaded, has hand tremors. She is having difficulty focusing.   Referrals  GO TO FACILITY UNDECIDED   Disagree/Comply: Comply

## 2016-01-20 NOTE — ED Provider Notes (Signed)
[facility]    CSN: [id number] Arrival date & time: 01/20/16  1514  First Provider Contact:  First MD Initiated Contact with Patient 01/20/16 1718        History   Chief Complaint Chief Complaint  Patient presents with  . Tachycardia    HPI Angela [name] is a 30 y.o. female.    Palpitations  Palpitations quality:  Fast Onset quality:  Sudden Duration:  5 hours Progression:  Unchanged Chronicity:  New Context: not anxiety and not caffeine   Relieved by:  None tried Worsened by:  Nothing Ineffective treatments:  None tried Associated symptoms: near-syncope and numbness   Associated symptoms: no chest pain and no shortness of breath     Past Medical History:  Diagnosis Date  . Abnormal Pap smear AGE 30   COLPO LAST PAP 08/2011  . Anemia   . Complication of anesthesia    NAUSEA; VOMITING; ITCHING; DIFFICULTY BREATHING  . Genital herpes   . GERD (gastroesophageal reflux disease) AGE 5   tums prn  . History of chicken pox   . Infection AGE 30   CHLAMYDIA; GONORRHEA  . 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 Infection    BV  . Migraines   . Ovarian cyst rupture   . PONV (postoperative nausea and vomiting)   . Preterm labor   . Urinary incontinence     Patient Active Problem List   Diagnosis Date Noted  . Menorrhagia with regular cycle 05/14/2015  . HTN (hypertension), benign 05/14/2015  . Prediabetes 05/14/2015  . Obesity (BMI 30-39.9) 02/14/2015  . GERD (gastroesophageal reflux disease) 02/14/2015  . History of migraine 02/14/2015  . Urge incontinence 02/14/2015  . Genital herpes 02/14/2015  . S/P tubal ligation 11/16/2012    Past Surgical History:  Procedure Laterality Date  . CERVIX LESION DESTRUCTION    . CESAREAN SECTION  2005, 2010   x2  . CESAREAN SECTION WITH BILATERAL TUBAL LIGATION Bilateral 11/15/2012   Procedure: REPEAT CESAREAN SECTION WITH BILATERAL TUBAL LIGATION;  Surgeon: Kirkland Hun, MD;  Location: [facility];  Service:  Obstetrics;  Laterality: Bilateral;  . COLPOSCOPY    . UNILATERAL SALPINGECTOMY Right 11/15/2012   Procedure: UNILATERAL SALPINGECTOMY;  Surgeon: Kirkland Hun, MD;  Location: [facility];  Service: Obstetrics;  Laterality: Right;  . WISDOM TOOTH EXTRACTION      OB History    Gravida Para Term Preterm AB Living   3 3 2 1   3    SAB TAB Ectopic Multiple Live Births           3      Obstetric Comments   2005 HYPEREMESIS 2010 MECONIUM ASPIRATION       Home Medications    Prior to Admission medications   Medication Sig Start Date End Date Taking? Authorizing Provider  losartan (COZAAR) 50 MG tablet Take 1 tablet (50 mg total) by mouth daily. 08/08/15   Amy Michelle Nasuti, MD  phentermine 15 MG capsule Take 1 capsule (15 mg total) by mouth every morning. 12/06/15   Amy Michelle Nasuti, MD  tolterodine (DETROL LA) 4 MG 24 hr capsule Take 1 capsule (4 mg total) by mouth daily. 04/29/15   Amy Michelle Nasuti, MD  valACYclovir (VALTREX) 1000 MG tablet Take 1 tablet (1,000 mg total) by mouth 2 (two) times daily. 10/25/15   Amy Michelle Nasuti, MD    Family History Family History  Problem Relation Age of Onset  . Anesthesia problems Mother  NAUSEA AND VOMITING  . Thyroid disease Mother   . Fibromyalgia Mother   . Sarcoidosis Mother   . Depression Mother   . Other Mother     VARICOSE VEINS  . Anesthesia problems Other   . Diabetes Father   . Hypertension Sister   . Cancer Maternal Grandfather     prostate  . Depression Maternal Aunt   . Sarcoidosis Maternal Aunt   . Arthritis Maternal Grandmother   . Heart disease Maternal Grandmother   . Hypertension Maternal Grandmother   . Kidney disease Maternal Grandmother     FAILURE  . Asthma Cousin     Social History Social History  Substance Use Topics  . Smoking status: Never Smoker  . Smokeless tobacco: Never Used  . Alcohol use 1.8 oz/week    2 Glasses of wine, 1 Shots of liquor per week     Comment: weekends but none since pregnancy      Allergies   Amoxicillin   Review of Systems Review of Systems  Constitutional: Negative.   HENT: Negative.   Respiratory: Negative.  Negative for shortness of breath.   Cardiovascular: Positive for palpitations and near-syncope. Negative for chest pain and leg swelling.  Neurological: Positive for numbness.  All other systems reviewed and are negative.    Physical Exam Triage Vital Signs ED Triage Vitals [01/20/16 1706]  Enc Vitals Group     BP 139/89     Pulse      Resp 16     Temp 99 F (37.2 C)     Temp Source Oral     SpO2 100 %     Weight      Height      Head Circumference      Peak Flow      Pain Score      Pain Loc      Pain Edu?      Excl. in GC?    No data found.   Updated Vital Signs BP 139/89 (BP Location: Left Arm)   Temp 99 F (37.2 C) (Oral)   Resp 16   SpO2 100%   Visual Acuity Right Eye Distance:   Left Eye Distance:   Bilateral Distance:    Right Eye Near:   Left Eye Near:    Bilateral Near:     Physical Exam  Constitutional: She appears well-developed and well-nourished.  Neck: Normal range of motion. Neck supple. No tracheal deviation present. No thyromegaly present.  Cardiovascular: Regular rhythm, normal heart sounds and normal pulses.  Tachycardia present.  PMI is not displaced.   Pulmonary/Chest: Effort normal and breath sounds normal.  Abdominal: Soft. Bowel sounds are normal.  Lymphadenopathy:    She has no cervical adenopathy.  Nursing note and vitals reviewed.    UC Treatments / Results  Labs (all labs ordered are listed, but only abnormal results are displayed) Labs Reviewed - No data to display I-stat wnl.  EKG nsr wnl.  Radiology No results found.  Procedures Procedures (including critical care time)  Medications Ordered in UC Medications - No data to display   Initial Impression / Assessment and Plan / UC Course  I have reviewed the triage vital signs and the nursing notes.  Pertinent labs  & imaging results that were available during my care of the patient were reviewed by me and considered in my medical decision making (see chart for details).  Clinical Course      Final Clinical Impressions(s) / UC Diagnoses  Final diagnoses:  None    New Prescriptions New Prescriptions   No medications on file     Linna HoffJames D Latresa Gasser, MD 01/20/16 [phone]

## 2016-03-04 ENCOUNTER — Other Ambulatory Visit: Payer: Self-pay | Admitting: Family Medicine

## 2016-03-04 NOTE — Telephone Encounter (Signed)
Last office visit 12/06/2015.  Last refilled 10/25/2015 for #20 with no refills.  Ok to refill?

## 2016-03-05 MED ORDER — VALACYCLOVIR HCL 1 G PO TABS: 1000.0000 mg | ORAL_TABLET | Freq: Two times a day (BID) | ORAL | 0 refills | Status: DC

## 2016-03-12 ENCOUNTER — Ambulatory Visit: Payer: Managed Care, Other (non HMO) | Admitting: Family Medicine

## 2016-03-17 ENCOUNTER — Encounter: Payer: Self-pay | Admitting: Family Medicine

## 2016-03-17 ENCOUNTER — Ambulatory Visit (INDEPENDENT_AMBULATORY_CARE_PROVIDER_SITE_OTHER): Payer: Managed Care, Other (non HMO) | Admitting: Family Medicine

## 2016-03-17 VITALS — BP 118/82 | HR 76 | Temp 98.6°F | Ht 63.25 in | Wt 164.5 lb

## 2016-03-17 DIAGNOSIS — E663 Overweight: Secondary | ICD-10-CM | POA: Diagnosis not present

## 2016-03-17 DIAGNOSIS — I10 Essential (primary) hypertension: Secondary | ICD-10-CM

## 2016-03-17 DIAGNOSIS — Z23 Encounter for immunization: Secondary | ICD-10-CM

## 2016-03-17 NOTE — Progress Notes (Signed)
30 year old female presents for follow up HTN and weight management.  Hypertension: Well controlled on cozaar BP Readings from Last 3 Encounters:  03/17/16 118/82  01/20/16 139/89  12/06/15 110/70  Using medication without problems or lightheadedness: None Chest pain with exertion:none Edema:none Short of breath:none Average home BPs:120/70 Other issues:  She was started back on  phentermine in the spring for weight loss  ON 01/20/2016 she went to urgent care for palpitations. No further issues.  She had stopped phentermine.  She has continued to lose weight. Wt Readings from Last 3 Encounters:  03/17/16 164 lb 8 oz (74.6 kg)  12/06/15 166 lb 8 oz (75.5 kg)  09/03/15 171 lb (77.6 kg)  Body mass index is 28.91 kg/m.  She is still doing well with healthy eating. Working on fruits and veggies.  She has been doing steps at work. Basketball, walking with stroller with kids.   Review of Systems  Constitutional: Negative for fever and fatigue.  HENT: Negative for ear pain.  Eyes: Negative for pain.  Respiratory: Negative for chest tightness and shortness of breath.  Cardiovascular: Negative for chest pain, palpitations and leg swelling.  Gastrointestinal: Negative for abdominal pain.  Genitourinary: Negative for dysuria.     Objective:  Physical Exam Constitutional: Vital signs are normal. She appears well-developed and well-nourished. She is cooperative. Non-toxic appearance. She does not appear ill. No distress.  HENT:  Head: Normocephalic.  Right Ear: Hearing, tympanic membrane, external ear and ear canal normal. Tympanic membrane is not erythematous, not retracted and not bulging.  Left Ear: Hearing, tympanic membrane, external ear and ear canal normal. Tympanic membrane is not erythematous, not retracted and not bulging.  Nose: No mucosal edema or rhinorrhea. Right sinus exhibits no maxillary sinus tenderness and no frontal sinus tenderness. Left  sinus exhibits no maxillary sinus tenderness and no frontal sinus tenderness.  Mouth/Throat: Uvula is midline, oropharynx is clear and moist and mucous membranes are normal.  Eyes: Conjunctivae, EOM and lids are normal. Pupils are equal, round, and reactive to light. Lids are everted and swept, no foreign bodies found.  Neck: Trachea normal and normal range of motion. Neck supple. Carotid bruit is not present. No thyroid mass and no thyromegaly present.  Cardiovascular: Normal rate, regular rhythm, S1 normal, S2 normal, normal heart sounds, intact distal pulses and normal pulses. Exam reveals no gallop and no friction rub.  No murmur heard. Pulmonary/Chest: Effort normal and breath sounds normal. No tachypnea. No respiratory distress. She has no decreased breath sounds. She has no wheezes. She has no rhonchi. She has no rales.  Abdominal: Soft. Normal appearance and bowel sounds are normal. There is no tenderness.  Neurological: She is alert.  Skin: Skin is warm, dry and intact. No rash noted.  Psychiatric: Her speech is normal and behavior is normal. Judgment and thought content normal. Her mood appears not anxious. Cognition and memory are normal. She does not exhibit a depressed mood.

## 2016-03-17 NOTE — Assessment & Plan Note (Signed)
Well controlled. Continue current medication. Encouraged exercise, weight loss, healthy eating habits.  

## 2016-03-17 NOTE — Progress Notes (Signed)
Pre visit review using our clinic review tool, if applicable. No additional management support is needed unless otherwise documented below in the visit note. 

## 2016-03-17 NOTE — Assessment & Plan Note (Signed)
Obesity improved, now in overweight range. Discussed continued lifestyle changes.

## 2016-04-03 ENCOUNTER — Other Ambulatory Visit: Payer: Self-pay | Admitting: Family Medicine

## 2016-04-05 MED ORDER — VALACYCLOVIR HCL 1 G PO TABS: 1000.0000 mg | ORAL_TABLET | Freq: Two times a day (BID) | ORAL | 0 refills | Status: DC

## 2016-04-05 NOTE — Telephone Encounter (Signed)
Last office visit 03/17/16.  Last refilled 03/05/16 for #20 with no refills.  Ok to refill?

## 2016-05-05 ENCOUNTER — Other Ambulatory Visit: Payer: Self-pay | Admitting: Family Medicine

## 2016-05-27 ENCOUNTER — Encounter: Payer: Self-pay | Admitting: Family Medicine

## 2016-06-21 ENCOUNTER — Other Ambulatory Visit: Payer: Self-pay | Admitting: Family Medicine

## 2016-07-28 ENCOUNTER — Other Ambulatory Visit: Payer: Self-pay | Admitting: Family Medicine

## 2016-07-28 MED ORDER — VALACYCLOVIR HCL 1 G PO TABS: 1000.0000 mg | ORAL_TABLET | Freq: Two times a day (BID) | ORAL | 0 refills | Status: DC

## 2016-07-28 NOTE — Telephone Encounter (Signed)
Last office visit 03/17/2016.  Last refilled 04/05/2016 for #20 with no refills.  Ok to refill?

## 2016-08-03 ENCOUNTER — Telehealth: Payer: Self-pay

## 2016-08-03 NOTE — Telephone Encounter (Signed)
Pt left v/m requesting cb about pt having a h/a for 1 1/2 weeks. Left v/m requesting pt to cb.[facility].

## 2016-08-10 ENCOUNTER — Telehealth: Payer: Self-pay | Admitting: Family Medicine

## 2016-08-10 NOTE — Telephone Encounter (Signed)
Patient Name: Angela [name]  DOB: 1986-01-23    Initial Comment Caller states has been having a headache for over two wks, checking bp and it has been normal. Left side of neck or if it is a earache, have questions?    Nurse Assessment  Nurse: Elwyn Lade, RN, Misty Stanley Date/Time (Eastern Time): 08/10/2016 3:29:04 PM  Confirm and document reason for call. If symptomatic, describe symptoms. ---caller states she has had a squeezing type h/a everyday for over 2 wks and sometimes radiates to back left neck and ear; bp wnl; h/a rated 6/10 currently; reports intermittent dizziness as well  Does the patient have any new or worsening symptoms? ---Yes  Will a triage be completed? ---Yes  Related visit to physician within the last 2 weeks? ---No  Does the PT have any chronic conditions? (i.e. diabetes, asthma, etc.) ---Yes  List chronic conditions. ---HTN, OAB  Is the patient pregnant or possibly pregnant? (Ask all females between the ages of 59-55) ---No  Is this a behavioral health or substance abuse call? ---No     Guidelines    Guideline Title Affirmed Question Affirmed Notes  Headache [1] MODERATE headache (e.g., interferes with normal activities) AND [2] present > 24 hours AND [3] unexplained (Exceptions: analgesics not tried, typical migraine, or headache part of viral illness)   Dizziness - Lightheadedness Taking a medicine that could cause dizziness (e.g., blood pressure medications, diuretics)    Final Disposition User   See Physician within 24 Hours Burress, RN, Misty Stanley    Comments  No APPTS AVAILABLE WITH PCP OR OTHER PROVIDER'S IN OFFICE; APPT SCHEDULED FOR 08/11/16 AT 8:15 AM WITH DR. Oliver Barre AT [facility]  ERROR-2 APPTS AVAILABLE AT [facility]CP OFFICE TOMORROW, BUT PT WANTS THE EARLY APPT AT [facility]   Referrals  REFERRED TO PCP OFFICE   Disagree/Comply: Comply    Disagree/Comply: Comply

## 2016-08-11 ENCOUNTER — Ambulatory Visit (INDEPENDENT_AMBULATORY_CARE_PROVIDER_SITE_OTHER): Payer: Managed Care, Other (non HMO) | Admitting: Internal Medicine

## 2016-08-11 ENCOUNTER — Encounter: Payer: Self-pay | Admitting: Internal Medicine

## 2016-08-11 VITALS — BP 142/92 | HR 90 | Temp 99.4°F | Ht 63.0 in | Wt 172.0 lb

## 2016-08-11 DIAGNOSIS — I10 Essential (primary) hypertension: Secondary | ICD-10-CM | POA: Diagnosis not present

## 2016-08-11 DIAGNOSIS — G43809 Other migraine, not intractable, without status migrainosus: Secondary | ICD-10-CM

## 2016-08-11 DIAGNOSIS — K219 Gastro-esophageal reflux disease without esophagitis: Secondary | ICD-10-CM | POA: Diagnosis not present

## 2016-08-11 DIAGNOSIS — G43909 Migraine, unspecified, not intractable, without status migrainosus: Secondary | ICD-10-CM | POA: Insufficient documentation

## 2016-08-11 MED ORDER — ONDANSETRON 4 MG PO TBDP
4.0000 mg | ORAL_TABLET | Freq: Once | ORAL | Status: AC
  Administered 2016-08-11: 4 mg via ORAL

## 2016-08-11 MED ORDER — IBUPROFEN 800 MG PO TABS: 800.0000 mg | ORAL_TABLET | Freq: Three times a day (TID) | ORAL | 0 refills | Status: DC | PRN

## 2016-08-11 MED ORDER — SUMATRIPTAN SUCCINATE 100 MG PO TABS: 100.0000 mg | ORAL_TABLET | ORAL | 1 refills | Status: DC | PRN

## 2016-08-11 MED ORDER — PANTOPRAZOLE SODIUM 40 MG PO TBEC: 40.0000 mg | DELAYED_RELEASE_TABLET | Freq: Every day | ORAL | 5 refills | Status: DC

## 2016-08-11 NOTE — Progress Notes (Signed)
Subjective:    Patient ID: Angela [name], female    DOB: [birth year], 31 y.o.   MRN: [id number]  HPI  Here with new onset daily HA's for 2 wks, left sided, throbbing, with blurry vision, dizziness, photophobia and phonophobia, with nausea, and just this AM has had vomit x 2 (without other abd pain, and Denies worsening reflux though still has some as she is not taking the PPI as recommended jan 2018, but no abd pain, dysphagia, n/v, bowel change or blood. Denies urinary symptoms such as dysuria, frequency, urgency, flank pain, hematuria or n/v, fever, chills.).  No prior hx of same, Pt denies new neurological symptoms such as facial or extremity weakness or numbness   BP is usually better controlled.  Has had much incresaed stress at work recently and feels she needs a different job  Has had no recent cns imaging BP Readings from Last 3 Encounters:  08/11/16 (!) 142/92  03/17/16 118/82  01/20/16 139/89   Past Medical History:  Diagnosis Date  . Abnormal Pap smear AGE 89   COLPO LAST PAP 08/2011  . Anemia   . Complication of anesthesia    NAUSEA; VOMITING; ITCHING; DIFFICULTY BREATHING  . Genital herpes   . GERD (gastroesophageal reflux disease) AGE 82   tums prn  . History of chicken pox   . Infection AGE 23   CHLAMYDIA; GONORRHEA  . 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 Infection    BV  . Migraines   . Ovarian cyst rupture   . PONV (postoperative nausea and vomiting)   . Preterm labor   . Urinary incontinence    Past Surgical History:  Procedure Laterality Date  . CERVIX LESION DESTRUCTION    . CESAREAN SECTION  2005, 2010   x2  . CESAREAN SECTION WITH BILATERAL TUBAL LIGATION Bilateral 11/15/2012   Procedure: REPEAT CESAREAN SECTION WITH BILATERAL TUBAL LIGATION;  Surgeon: Kirkland Hun, MD;  Location: [facility];  Service: Obstetrics;  Laterality: Bilateral;  . COLPOSCOPY    . UNILATERAL SALPINGECTOMY Right 11/15/2012   Procedure: UNILATERAL SALPINGECTOMY;  Surgeon: Kirkland Hun,  MD;  Location: [facility];  Service: Obstetrics;  Laterality: Right;  . WISDOM TOOTH EXTRACTION      reports that she has never smoked. She has never used smokeless tobacco. She reports that she drinks about 1.8 oz of alcohol per week . She reports that she does not use drugs. family history includes Anesthesia problems in her mother and other; Arthritis in her maternal grandmother; Asthma in her cousin; Cancer in her maternal grandfather; Depression in her maternal aunt and mother; Diabetes in her father; Fibromyalgia in her mother; Heart disease in her maternal grandmother; Hypertension in her maternal grandmother and sister; Kidney disease in her maternal grandmother; Other in her mother; Sarcoidosis in her maternal aunt and mother; Thyroid disease in her mother. Allergies  Allergen Reactions  . Amoxicillin Hives    Has patient had a PCN reaction causing immediate rash, facial/tongue/throat swelling, SOB or lightheadedness with hypotension: No Has patient had a PCN reaction causing severe rash involving mucus membranes or skin necrosis: Yes Has patient had a PCN reaction that required hospitalization No Has patient had a PCN reaction occurring within the last 10 years: No If all of the above answers are "NO", then may proceed with Cephalosporin use.    Current Outpatient Prescriptions on File Prior to Visit  Medication Sig Dispense Refill  . losartan (COZAAR) 50 MG tablet TAKE 1 TABLET BY  MOUTH  DAILY 90 tablet 1  . tolterodine (DETROL LA) 4 MG 24 hr capsule TAKE 1 CAPSULE BY MOUTH  DAILY 90 capsule 2  . valACYclovir (VALTREX) 1000 MG tablet Take 1 tablet (1,000 mg total) by mouth 2 (two) times daily. 20 tablet 0   No current facility-administered medications on file prior to visit.    Review of Systems  Constitutional: Negative for other unusual diaphoresis or sweats HENT: Negative for ear discharge or swelling Eyes: Negative for other worsening visual disturbances Respiratory: Negative  for stridor or other swelling  Gastrointestinal: Negative for worsening distension or other blood Genitourinary: Negative for retention or other urinary change Musculoskeletal: Negative for other MSK pain or swelling Skin: Negative for color change or other new lesions Neurological: Negative for worsening tremors and other numbness  Psychiatric/Behavioral: Negative for worsening agitation or other fatigue All other system neg per pt    Objective:   Physical Exam BP (!) 142/92   Pulse 90   Temp 99.4 F (37.4 C) (Oral)   Ht  (1.6 m)   Wt 172 lb (78 kg)   SpO2 98%   BMI 30.47 kg/m  VS noted, mild naause appearing Constitutional: Pt appears in NAD HENT: Head: NCAT.  Right Ear: External ear normal.  Left Ear: External ear normal.  Eyes: . Pupils are equal, round, and reactive to light. Conjunctivae and EOM are normal Nose: without d/c or deformity Neck: Neck supple. Gross normal ROM Cardiovascular: Normal rate and regular rhythm.   Pulmonary/Chest: Effort normal and breath sounds without rales or wheezing.  Abd:  Soft, NT, ND, + BS, no organomegaly - benign exam Neurological: Pt is alert. At baseline orientation, motor grossly intact, cn 2-12 intact Skin: Skin is warm. No rashes, other new lesions, no LE edema Psychiatric: Pt behavior is normal without agitation  No other exam findings    Assessment & Plan:

## 2016-08-11 NOTE — Assessment & Plan Note (Signed)
Ok for trial protonix 40 qd,  to f/u any worsening symptoms or concerns

## 2016-08-11 NOTE — Assessment & Plan Note (Signed)
Mild elevated, likely situational, for cont;d same tx

## 2016-08-11 NOTE — Assessment & Plan Note (Addendum)
With nausea - for zofram 4 mg IM x 1 now, With transformed hx over the past 2 wks to daily, for ibuprofen 800 qid prn, imitrex prn, avoid stress if possible, consider topamax and/or neurology referral but declines at this time, I do not feel pt needs imaging at this time

## 2016-08-11 NOTE — Progress Notes (Signed)
Pre visit review using our clinic review tool, if applicable. No additional management support is needed unless otherwise documented below in the visit note. 

## 2016-08-11 NOTE — Patient Instructions (Signed)
Please take all new medication as prescribed  - the ibuprofen as needed, imitrex as needed for migraine, and protonix for stomach  Please continue all other medications as before, and refills have been done if requested.  Please have the pharmacy call with any other refills you may need.  Please keep your appointments with your specialists as you may have planned  Please see your PCP in 1-2 wks if not improving

## 2016-08-21 NOTE — Telephone Encounter (Signed)
-----   Message from Roney Mans, [place] sent at 08/12/2016  9:23 AM EDT ----- Regarding: FW: ONDANSETRON ORAL Delaney Meigs please see below msg. Im not sure how to go in and change from tablet or injection. A Zofran injection was given during the OV yesterday.  ----- Message ----- From: Randal Buba Sent: 08/12/2016   9:11 AM To: Roney Mans, CMA Subject: ONDANSETRON ORAL                               Hi Shirron,  For DOS 08/11/2016 there is a charge dropped for ONDANSETRON ORAL and also a charge for an injection fee.  In reviewing Dr. Raphael Gibney notes, it appears the medication was actually injected instead of given orally.  Could you please review the chart to be sure I am correct and if so, please addend it to reflect the correct medication and remove the oral one.  If you need assistance, you can check w/Tamara.  Once reviewed and corrected, please let me know so I can send the charge out to be paid.  Thank you! Okey Dupre

## 2016-08-21 NOTE — Telephone Encounter (Signed)
Pt has been seen since call. 

## 2016-08-21 NOTE — Telephone Encounter (Signed)
I have placed ticket with chris/IT---he is to have outstanding oral med admin charge taken out

## 2016-08-25 ENCOUNTER — Ambulatory Visit (INDEPENDENT_AMBULATORY_CARE_PROVIDER_SITE_OTHER): Payer: Managed Care, Other (non HMO) | Admitting: Family Medicine

## 2016-08-25 ENCOUNTER — Encounter: Payer: Self-pay | Admitting: Family Medicine

## 2016-08-25 VITALS — BP 112/80 | HR 83 | Temp 98.4°F | Ht 63.0 in | Wt 175.0 lb

## 2016-08-25 DIAGNOSIS — G43809 Other migraine, not intractable, without status migrainosus: Secondary | ICD-10-CM

## 2016-08-25 MED ORDER — TOPIRAMATE ER 25 MG PO SPRINKLE CAP24: 25.0000 mg | EXTENDED_RELEASE_CAPSULE | Freq: Every evening | ORAL | 11 refills | Status: DC | PRN

## 2016-08-25 NOTE — Progress Notes (Signed)
Subjective:    Patient ID: Angela [name], female    DOB: 1985-07-10, 31 y.o.   MRN: [id number]  HPI  31 year old female with history of migraine presents for acute headaches, more frequent in last 4 weeks.  Occurring daily prior to 4/3 appt.    Recently seen by Dr. Jonny Ruiz on 4/3 for acute migraine. Treated with zofran, using ibuprofen , imitrex.  Acute headache resolved with imitrex.  Now having headache every other day.  Feels band around head, squeezing. Associated with nausea, blurred vision and dizziness. Photophonophobia associated. She has also noted ear pressure.  Triggers: increase in stress.  no new meds.  She continue exercise and working on weight loss. She has stopped dairy    BP Readings from Last 3 Encounters:  08/25/16 112/80  08/11/16 (!) 142/92  03/17/16 118/82   Family  Hx of migraine in mom and sister.  Review of Systems  Constitutional: Negative for fatigue and fever.  HENT: Positive for ear pain.   Eyes: Negative for pain.  Respiratory: Negative for chest tightness and shortness of breath.   Cardiovascular: Negative for chest pain, palpitations and leg swelling.  Gastrointestinal: Negative for abdominal pain.  Genitourinary: Negative for dysuria.       Objective:   Physical Exam  Constitutional: She is oriented to person, place, and time. Vital signs are normal. She appears well-developed and well-nourished. She is cooperative.  Non-toxic appearance. She does not appear ill. No distress.  HENT:  Head: Normocephalic.  Right Ear: Hearing, tympanic membrane, external ear and ear canal normal. Tympanic membrane is not erythematous, not retracted and not bulging.  Left Ear: Hearing, tympanic membrane, external ear and ear canal normal. Tympanic membrane is not erythematous, not retracted and not bulging.  Nose: No mucosal edema or rhinorrhea. Right sinus exhibits no maxillary sinus tenderness and no frontal sinus tenderness. Left sinus exhibits  no maxillary sinus tenderness and no frontal sinus tenderness.  Mouth/Throat: Uvula is midline, oropharynx is clear and moist and mucous membranes are normal.  Eyes: Conjunctivae, EOM and lids are normal. Pupils are equal, round, and reactive to light. Lids are everted and swept, no foreign bodies found.  Neck: Trachea normal and normal range of motion. Neck supple. Carotid bruit is not present. No thyroid mass and no thyromegaly present.  Cardiovascular: Normal rate, regular rhythm, S1 normal, S2 normal, normal heart sounds, intact distal pulses and normal pulses.  Exam reveals no gallop and no friction rub.   No murmur heard. Pulmonary/Chest: Effort normal and breath sounds normal. No tachypnea. No respiratory distress. She has no decreased breath sounds. She has no wheezes. She has no rhonchi. She has no rales.  Abdominal: Soft. Normal appearance and bowel sounds are normal. There is no tenderness.  Neurological: She is alert and oriented to person, place, and time. She has normal strength and normal reflexes. No cranial nerve deficit or sensory deficit. She exhibits normal muscle tone. She displays a negative Romberg sign. Coordination and gait normal. GCS eye subscore is 4. GCS verbal subscore is 5. GCS motor subscore is 6.  Nml cerebellar exam   No papilledema  Skin: Skin is warm, dry and intact. No rash noted.  Psychiatric: She has a normal mood and affect. Her speech is normal and behavior is normal. Judgment and thought content normal. Her mood appears not anxious. Cognition and memory are normal. Cognition and memory are not impaired. She does not exhibit a depressed mood. She exhibits normal recent memory  and normal remote memory.          Assessment & Plan:

## 2016-08-25 NOTE — Patient Instructions (Addendum)
Start topamax at bedtime. USE BIRTH CONTROL OF SOME TYPE WHILE ON.  Keep  headache diary and work on  Actuary.  Start/continue healthy lifestyle.. Adequate sleep, no skipping meals , exercise etc.  Imitrex for severe headache, ibuprofen for milder. Follow up in 1 month for re-eval.

## 2016-08-25 NOTE — Progress Notes (Signed)
Pre visit review using our clinic review tool, if applicable. No additional management support is needed unless otherwise documented below in the visit note. 

## 2016-08-25 NOTE — Assessment & Plan Note (Addendum)
Start topamax. Given info on headache diary and trigger avoidance.  Start/continue healthy lifestyle.. Adequate sleep, no skipping meals , exercise etc.  Imitrex for  Severe headache, ibuprofen for milder. Follow up in 1 month for re-eval.  No red flags, or indication for imaging.

## 2016-08-28 ENCOUNTER — Telehealth: Payer: Self-pay | Admitting: Family Medicine

## 2016-08-28 NOTE — Telephone Encounter (Signed)
Angela [name] notified that FMLA forms were faxed in this morning and the originals are available at the front desk for her to pick up.

## 2016-08-28 NOTE — Telephone Encounter (Signed)
Patient said she gave Dr.Bedsole  FMLA paperwork at her office visit on 08/25/16.  Patient wants to know if paperwork is ready.  Patient has to have the paperwork in by today.  Please call patient.  Paperwork can be faxed directly to [employer].

## 2016-09-22 ENCOUNTER — Ambulatory Visit: Payer: Managed Care, Other (non HMO) | Admitting: Family Medicine

## 2016-09-25 ENCOUNTER — Ambulatory Visit: Payer: Managed Care, Other (non HMO) | Admitting: Family Medicine

## 2016-09-28 ENCOUNTER — Telehealth: Payer: Self-pay | Admitting: Family Medicine

## 2016-09-28 DIAGNOSIS — I10 Essential (primary) hypertension: Secondary | ICD-10-CM

## 2016-09-28 DIAGNOSIS — R7303 Prediabetes: Secondary | ICD-10-CM

## 2016-09-28 NOTE — Telephone Encounter (Signed)
-----   Message from Alvina Chouerri J Walsh sent at 09/22/2016  2:27 PM EDT ----- Regarding: Lab orders for Thursday, 5.24.18 Patient is scheduled for CPX labs, please order future labs, Thanks , Camelia Engerri

## 2016-10-01 ENCOUNTER — Other Ambulatory Visit: Payer: Managed Care, Other (non HMO)

## 2016-10-02 ENCOUNTER — Encounter: Payer: Self-pay | Admitting: Family Medicine

## 2016-10-02 ENCOUNTER — Ambulatory Visit (INDEPENDENT_AMBULATORY_CARE_PROVIDER_SITE_OTHER): Payer: Managed Care, Other (non HMO) | Admitting: Family Medicine

## 2016-10-02 ENCOUNTER — Other Ambulatory Visit: Payer: Managed Care, Other (non HMO)

## 2016-10-02 VITALS — BP 119/75 | HR 76 | Temp 98.9°F | Ht 63.0 in | Wt 176.0 lb

## 2016-10-02 DIAGNOSIS — R7303 Prediabetes: Secondary | ICD-10-CM

## 2016-10-02 DIAGNOSIS — I10 Essential (primary) hypertension: Secondary | ICD-10-CM

## 2016-10-02 DIAGNOSIS — G43809 Other migraine, not intractable, without status migrainosus: Secondary | ICD-10-CM | POA: Diagnosis not present

## 2016-10-02 NOTE — Progress Notes (Signed)
Subjective:    Patient ID: Angela [name], female    DOB: 10/12/1985, 31 y.o.   MRN: [id number]  HPI   31 year old female presents for follow up migraine.  At last OV she was started on topamax at bedtime. She reports she did not  Fill  this  Given fear on SE. Using imitrex for severe headache.. Has had to use it 2 times in last month. Using ibuprofen for headaches more often given she does not wish to use at work.  She has been having 13 headaches in lasting  Usually 1-2 hours, some 6-12 hours.  Ibuprofen helps sometime, imitrex makes it go away completely.  She has noted any triggers other that work and stress. No foods.     no neurologic symptoms, no numbness, no slurred speech.  Blood pressure 119/75, pulse 76, temperature 98.9 F (37.2 C), temperature source Oral, height 5\' 3"  (1.6 m), weight 176 lb (79.8 kg).   Review of Systems  Constitutional: Negative for fatigue and fever.  HENT: Negative for congestion.   Eyes: Negative for pain.  Respiratory: Negative for cough and shortness of breath.   Cardiovascular: Negative for chest pain, palpitations and leg swelling.  Gastrointestinal: Negative for abdominal pain.  Genitourinary: Negative for dysuria and vaginal bleeding.  Musculoskeletal: Negative for back pain.  Neurological: Positive for headaches. Negative for syncope and light-headedness.  Psychiatric/Behavioral: Negative for dysphoric mood.       Objective:   Physical Exam  Constitutional: She is oriented to person, place, and time. Vital signs are normal. She appears well-developed and well-nourished. She is cooperative.  Non-toxic appearance. She does not appear ill. No distress.  HENT:  Head: Normocephalic.  Right Ear: Hearing, tympanic membrane, external ear and ear canal normal. Tympanic membrane is not erythematous, not retracted and not bulging.  Left Ear: Hearing, tympanic membrane, external ear and ear canal normal. Tympanic membrane is not  erythematous, not retracted and not bulging.  Nose: No mucosal edema or rhinorrhea. Right sinus exhibits no maxillary sinus tenderness and no frontal sinus tenderness. Left sinus exhibits no maxillary sinus tenderness and no frontal sinus tenderness.  Mouth/Throat: Uvula is midline, oropharynx is clear and moist and mucous membranes are normal.  Eyes: Conjunctivae, EOM and lids are normal. Pupils are equal, round, and reactive to light. Lids are everted and swept, no foreign bodies found.  Neck: Trachea normal and normal range of motion. Neck supple. Carotid bruit is not present. No thyroid mass and no thyromegaly present.  Cardiovascular: Normal rate, regular rhythm, S1 normal, S2 normal, normal heart sounds, intact distal pulses and normal pulses.  Exam reveals no gallop and no friction rub.   No murmur heard. Pulmonary/Chest: Effort normal and breath sounds normal. No tachypnea. No respiratory distress. She has no decreased breath sounds. She has no wheezes. She has no rhonchi. She has no rales.  Abdominal: Soft. Normal appearance and bowel sounds are normal. There is no tenderness.  Neurological: She is alert and oriented to person, place, and time. She has normal strength and normal reflexes. No cranial nerve deficit or sensory deficit. She exhibits normal muscle tone. She displays a negative Romberg sign. Coordination and gait normal. GCS eye subscore is 4. GCS verbal subscore is 5. GCS motor subscore is 6.  Nml cerebellar exam   No papilledema  Skin: Skin is warm, dry and intact. No rash noted.  Psychiatric: She has a normal mood and affect. Her speech is normal and behavior is normal.  Judgment and thought content normal. Her mood appears not anxious. Cognition and memory are normal. Cognition and memory are not impaired. She does not exhibit a depressed mood. She exhibits normal recent memory and normal remote memory.          Assessment & Plan:

## 2016-10-02 NOTE — Addendum Note (Signed)
Addended by: Baldomero LamyHAVERS, NATASHA C on: 10/02/2016 04:03 PM   Modules accepted: Orders

## 2016-10-02 NOTE — Addendum Note (Signed)
Addended by: Baldomero LamyHAVERS, Jassmin Kemmerer C on: 10/02/2016 04:40 PM   Modules accepted: Orders

## 2016-10-02 NOTE — Patient Instructions (Addendum)
Please stop at the front desk to set up referral. Work on stress reduction , relaxation. Push fluids and keep healthy sleep habits.

## 2016-10-04 LAB — COMPREHENSIVE METABOLIC PANEL
A/G RATIO: 1.8 (ref 1.2–2.2)
ALT: 23 IU/L (ref 0–32)
AST: 20 IU/L (ref 0–40)
Albumin: 4.8 g/dL (ref 3.5–5.5)
Alkaline Phosphatase: 61 IU/L (ref 39–117)
BUN/Creatinine Ratio: 13 (ref 9–23)
BUN: 11 mg/dL (ref 6–20)
Bilirubin Total: 0.3 mg/dL (ref 0.0–1.2)
CALCIUM: 9.6 mg/dL (ref 8.7–10.2)
CO2: 22 mmol/L (ref 18–29)
Chloride: 102 mmol/L (ref 96–106)
Creatinine, Ser: 0.82 mg/dL (ref 0.57–1.00)
GFR, EST AFRICAN AMERICAN: 109 mL/min/{1.73_m2} (ref >59)
GFR, EST NON AFRICAN AMERICAN: 95 mL/min/{1.73_m2} (ref >59)
GLOBULIN, TOTAL: 2.6 g/dL (ref 1.5–4.5)
Glucose: 101 mg/dL — ABNORMAL HIGH (ref 65–99)
POTASSIUM: 4.6 mmol/L (ref 3.5–5.2)
SODIUM: 141 mmol/L (ref 134–144)
TOTAL PROTEIN: 7.4 g/dL (ref 6.0–8.5)

## 2016-10-04 LAB — LIPID PANEL
CHOLESTEROL TOTAL: 137 mg/dL (ref 100–199)
Chol/HDL Ratio: 2.3 ratio (ref 0.0–4.4)
HDL: 59 mg/dL (ref >39)
LDL Calculated: 70 mg/dL (ref 0–99)
Triglycerides: 41 mg/dL (ref 0–149)
VLDL CHOLESTEROL CAL: 8 mg/dL (ref 5–40)

## 2016-10-04 LAB — HEMOGLOBIN A1C
Est. average glucose Bld gHb Est-mCnc: 114 mg/dL
Hgb A1c MFr Bld: 5.6 % (ref 4.8–5.6)

## 2016-10-08 ENCOUNTER — Ambulatory Visit (INDEPENDENT_AMBULATORY_CARE_PROVIDER_SITE_OTHER): Payer: Managed Care, Other (non HMO) | Admitting: Family Medicine

## 2016-10-08 ENCOUNTER — Encounter: Payer: Self-pay | Admitting: Family Medicine

## 2016-10-08 VITALS — BP 130/83 | HR 74 | Temp 98.6°F | Ht 63.5 in | Wt 175.8 lb

## 2016-10-08 DIAGNOSIS — N3941 Urge incontinence: Secondary | ICD-10-CM | POA: Diagnosis not present

## 2016-10-08 DIAGNOSIS — I10 Essential (primary) hypertension: Secondary | ICD-10-CM | POA: Diagnosis not present

## 2016-10-08 DIAGNOSIS — R7303 Prediabetes: Secondary | ICD-10-CM

## 2016-10-08 DIAGNOSIS — Z0000 Encounter for general adult medical examination without abnormal findings: Secondary | ICD-10-CM | POA: Diagnosis not present

## 2016-10-08 NOTE — Patient Instructions (Signed)
Keep up working on healthy eating, exercise and weight loss.

## 2016-10-08 NOTE — Assessment & Plan Note (Signed)
Improved

## 2016-10-08 NOTE — Assessment & Plan Note (Signed)
Poor control, frequent. Referral to headache wellness center.

## 2016-10-08 NOTE — Assessment & Plan Note (Signed)
Well controlled. Continue current medication.  

## 2016-10-08 NOTE — Progress Notes (Signed)
Subjective:    Patient ID: Bryan LemmaVaniqua S Yates, female    DOB: 05/18/1985, 31 y.o.   MRN: [id number]  HPI  The patient is here for annual wellness exam and preventative care.    Hypertension:    Good control on losartan 5 mg daily  control  BP Readings from Last 3 Encounters:  10/08/16 130/83  10/02/16 119/75  08/25/16 112/80   Using medication without problems or lightheadedness: none  Chest pain with exertion: none Edema:none Short of breath: none Average home BPs: not checking Other issues:  Reviewed labs in detail   Lab Results  Component Value Date   CHOL 137 10/03/2016   HDL 59 10/03/2016   LDLCALC 70 10/03/2016   TRIG 41 10/03/2016   CHOLHDL 2.3 10/03/2016   Prediabetes improved Lab Results  Component Value Date   HGBA1C 5.6 10/03/2016   Wt Readings from Last 3 Encounters:  10/08/16 175 lb 12 oz (79.7 kg)  10/02/16 176 lb (79.8 kg)  08/25/16 175 lb (79.4 kg)  Body mass index is 30.64 kg/m.  Urinary incontinence: Well controlled on Detrol. LA . Social History /Family History/Past Medical History reviewed in detail and updated in EMR if needed. Blood pressure 130/83, pulse 74, temperature 98.6 F (37 C), temperature source Oral, height 5' 3.5" (1.613 m), weight 175 lb 12 oz (79.7 kg).   Review of Systems  Constitutional: Negative for fatigue and fever.  HENT: Negative for congestion.   Eyes: Negative for pain.  Respiratory: Negative for cough and shortness of breath.   Cardiovascular: Negative for chest pain, palpitations and leg swelling.  Gastrointestinal: Negative for abdominal pain.  Genitourinary: Negative for dysuria and vaginal bleeding.  Musculoskeletal: Negative for back pain.  Neurological: Positive for headaches. Negative for syncope and light-headedness.  Psychiatric/Behavioral: Negative for dysphoric mood.       Objective:   Physical Exam  Constitutional: Vital signs are normal. She appears well-developed and well-nourished. She is  cooperative.  Non-toxic appearance. She does not appear ill. No distress.  HENT:  Head: Normocephalic.  Right Ear: Hearing, tympanic membrane, external ear and ear canal normal.  Left Ear: Hearing, tympanic membrane, external ear and ear canal normal.  Nose: Nose normal.  Eyes: Conjunctivae, EOM and lids are normal. Pupils are equal, round, and reactive to light. Lids are everted and swept, no foreign bodies found.  Neck: Trachea normal and normal range of motion. Neck supple. Carotid bruit is not present. No thyroid mass and no thyromegaly present.  Cardiovascular: Normal rate, regular rhythm, S1 normal, S2 normal, normal heart sounds and intact distal pulses.  Exam reveals no gallop.   No murmur heard. Pulmonary/Chest: Effort normal and breath sounds normal. No respiratory distress. She has no wheezes. She has no rhonchi. She has no rales.  Abdominal: Soft. Normal appearance and bowel sounds are normal. She exhibits no distension, no fluid wave, no abdominal bruit and no mass. There is no hepatosplenomegaly. There is no tenderness. There is no rebound, no guarding and no CVA tenderness. No hernia.  Lymphadenopathy:    She has no cervical adenopathy.    She has no axillary adenopathy.  Neurological: She is alert. She has normal strength. No cranial nerve deficit or sensory deficit.  Skin: Skin is warm, dry and intact. No rash noted.  Psychiatric: Her speech is normal and behavior is normal. Judgment normal. Her mood appears not anxious. Cognition and memory are normal. She does not exhibit a depressed mood.  Assessment & Plan:  The patient's preventative maintenance and recommended screening tests for an annual wellness exam were reviewed in full today. Brought up to date unless services declined.  Counselled on the importance of diet, exercise, and its role in overall health and mortality. The patient's FH and SH was reviewed, including their home life, tobacco status, and drug  and alcohol status.   Vaccines: Uptodate with flu and Tdap Pelvic: DVE/pap; last pap 2017 nml, no high risk HPV, repeat in 5 years. No early family history of colon, uterine , ovarian or breast cancer.  Nonsmoker HIV/STD: refused.

## 2016-10-18 ENCOUNTER — Other Ambulatory Visit: Payer: Self-pay | Admitting: Family Medicine

## 2016-10-28 ENCOUNTER — Telehealth: Payer: Self-pay | Admitting: Family Medicine

## 2016-10-28 NOTE — Telephone Encounter (Signed)
Left message asking pt to call office  I have questions about paperwork that was dropped off

## 2016-10-29 NOTE — Telephone Encounter (Signed)
Patient stated it was to go to restroom as needed once every hour and up to 10 times daily

## 2016-11-02 NOTE — Telephone Encounter (Signed)
Paperwork in dr bedsole in box  °For review and signature °

## 2016-11-03 DIAGNOSIS — Z7689 Persons encountering health services in other specified circumstances: Secondary | ICD-10-CM

## 2016-11-05 NOTE — Telephone Encounter (Signed)
Patient called to find out if paperwork has been completed. The form can be faxed to the fax number on the form.  The deadline for form is today.

## 2016-11-05 NOTE — Telephone Encounter (Signed)
This is no longer in my inbox.. I believe I competed this and put in outbox.

## 2016-11-05 NOTE — Telephone Encounter (Signed)
Please call patient at work number when form is completed.  Patient would like to pick up form when complete.

## 2016-11-06 NOTE — Telephone Encounter (Signed)
Paperwork faxed °Copy for pt °Copy for file °Copy for scan °

## 2016-11-06 NOTE — Telephone Encounter (Signed)
Lyla SonCarrie,  Have you seen a form for this patient?

## 2016-11-20 ENCOUNTER — Telehealth: Payer: Self-pay | Admitting: Family Medicine

## 2016-11-20 NOTE — Telephone Encounter (Signed)
Forms faxed to [phone]  Copy for file Copy to scan No charge

## 2016-11-20 NOTE — Telephone Encounter (Signed)
Updated info needed for review in order to determine ADA workplace accommodation.   Form in Dr. Ermalene SearingBedsole INbox

## 2016-11-29 ENCOUNTER — Other Ambulatory Visit: Payer: Self-pay | Admitting: Family Medicine

## 2016-11-30 MED ORDER — VALACYCLOVIR HCL 1 G PO TABS: 1000.0000 mg | ORAL_TABLET | Freq: Two times a day (BID) | ORAL | 0 refills | Status: DC

## 2016-11-30 NOTE — Telephone Encounter (Signed)
Last office visit 10/08/2016.  Last refilled 07/28/2016 for #20 with no refills.  Ok to refill?

## 2016-12-14 ENCOUNTER — Ambulatory Visit (INDEPENDENT_AMBULATORY_CARE_PROVIDER_SITE_OTHER): Payer: Managed Care, Other (non HMO) | Admitting: Neurology

## 2016-12-14 ENCOUNTER — Encounter: Payer: Self-pay | Admitting: Family Medicine

## 2016-12-14 ENCOUNTER — Encounter: Payer: Self-pay | Admitting: Neurology

## 2016-12-14 VITALS — BP 137/92 | HR 76 | Ht 63.0 in | Wt 176.0 lb

## 2016-12-14 DIAGNOSIS — G43701 Chronic migraine without aura, not intractable, with status migrainosus: Secondary | ICD-10-CM

## 2016-12-14 MED ORDER — ELETRIPTAN HYDROBROMIDE 40 MG PO TABS: 40.0000 mg | ORAL_TABLET | ORAL | 6 refills | Status: DC | PRN

## 2016-12-14 MED ORDER — PANTOPRAZOLE SODIUM 40 MG PO TBEC: 40.0000 mg | DELAYED_RELEASE_TABLET | Freq: Every day | ORAL | 3 refills | Status: DC

## 2016-12-14 NOTE — Telephone Encounter (Signed)
Rx faxed to [facility] [phone]

## 2016-12-14 NOTE — Patient Instructions (Signed)
Remember to drink plenty of fluid, eat healthy meals and do not skip any meals. Try to eat protein with a every meal and eat a healthy snack such as fruit or nuts in between meals. Try to keep a regular sleep-wake schedule and try to exercise daily, particularly in the form of walking, 20-30 minutes a day, if you can.   As far as your medications are concerned, I would like to suggest:  Topiramate ER (Qudexy or Trokendi) Nortriptyline Propranolol Gabapentin  As far as diagnostic testing:   I would like to see you back in 3 months, sooner if we need to. Please call us with any interim questions, concerns, problems, updates or refill requests.   Our phone number is [phone]. We also have an after hours call service for urgent matters and there is a physician on-call for urgent questions. For any emergencies you know to call 911 or go to the nearest emergency room  Eletriptan tablets What is this medicine? ELETRIPTAN (el ih TRIP tan) is used to treat migraines with or without aura. An aura is a strange feeling or visual disturbance that warns you of an attack. It is not used to prevent migraines. This medicine may be used for other purposes; ask your health care provider or pharmacist if you have questions. COMMON BRAND NAME(S): Relpax What should I tell my health care provider before I take this medicine? They need to know if you have any of these conditions: -bowel disease or colitis -diabetes -family history of heart disease -fast or irregular heart beat -heart or blood vessel disease, angina (chest pain), or previous heart attack -high blood pressure -high cholesterol -history of stroke, transient ischemic attacks (TIAs or mini-strokes), or intracranial bleeding -kidney or liver disease -overweight -poor circulation -postmenopausal or surgical removal of uterus and ovaries -Raynaud's disease -seizure disorder -an unusual or allergic reaction to eletriptan, other medicines,  foods, dyes, or preservatives -pregnant or trying to get pregnant -breast-feeding How should I use this medicine? Take this medicine by mouth with a glass of water. Follow the directions on the prescription label. This medicine is taken at the first symptoms of a migraine. It is not for everyday use. If your migraine headache returns after one dose, you can take another dose as directed. You must leave at least 2 hours between doses, and do not take more than 40 mg as a single dose. Do not take more than 80 mg total in any 24 hour period. If there is no improvement at all after the first dose, do not take a second dose without talking to your doctor or health care professional. Do not take your medicine more often than directed. Talk to your pediatrician regarding the use of this medicine in children. Special care may be needed. Overdosage: If you think you have taken too much of this medicine contact a poison control center or emergency room at once. NOTE: This medicine is only for you. Do not share this medicine with others. What if I miss a dose? This does not apply; this medicine is not for regular use. What may interact with this medicine? Do not take this medicine with any of the following medications: -antiviral medicines for HIV or AIDS -certain antibiotics like clarithromycin, erythromycin, telithromycin -cimetidine -conivaptan -dalfopristin; quinupristin -diltiazem -ergot alkaloids like dihydroergotamine, ergonovine, ergotamine, methylergonovine -fluvoxamine -idelalisib -imatinib -medicines for fungal infections like fluconazole, itraconazole, ketoconazole, and voriconazole -mifepristone -nefazodone -stimulant medicines for attention disorders, weight loss, or to stay awake -verapamil -zafirlukast This  medicine may also interact with the following medications: -certain medicines for depression, anxiety, or psychotic disturbances This list may not describe all possible  interactions. Give your health care provider a list of all the medicines, herbs, non-prescription drugs, or dietary supplements you use. Also tell them if you smoke, drink alcohol, or use illegal drugs. Some items may interact with your medicine. What should I watch for while using this medicine? Only take this medicine for a migraine headache. Take it if you get warning symptoms or at the start of a migraine attack. It is not for regular use to prevent migraine attacks. You may get drowsy or dizzy. Do not drive, use machinery, or do anything that needs mental alertness until you know how this medicine affects you. To reduce dizzy or fainting spells, do not sit or stand up quickly, especially if you are an older patient. Alcohol can increase drowsiness, dizziness and flushing. Avoid alcoholic drinks. Smoking cigarettes may increase the risk of heart-related side effects from using this medicine. If you take migraine medicines for 10 or more days a month, your migraines may get worse. Keep a diary of headache days and medicine use. Contact your healthcare professional if your migraine attacks occur more frequently. What side effects may I notice from receiving this medicine? Side effects that you should report to your doctor or health care professional as soon as possible: -allergic reactions like skin rash, itching or hives, swelling of the face, lips, or tongue -fast, slow, or irregular heart beat -increased or decreased blood pressure -seizures -severe stomach pain and cramping, bloody diarrhea -signs and symptoms of a blood clot such as breathing problems; changes in vision; chest pain; severe, sudden headache; pain, swelling, warmth in the leg; trouble speaking; sudden numbness or weakness of the face, arm or leg -tingling, pain, or numbness in the face, hands, or feet Side effects that usually do not require medical attention (report to your doctor or health care professional if they continue or  are bothersome): -drowsiness -feeling warm, flushing, or redness of the face -headache -muscle cramps, pain -nausea, vomiting -unusually weak or tired This list may not describe all possible side effects. Call your doctor for medical advice about side effects. You may report side effects to FDA at 1-800-FDA-1088. Where should I keep my medicine? Keep out of the reach of children. Store at room temperature between 15 and 30 degrees C (59 and 86 degrees F). Throw away any unused medicine after the expiration date. NOTE: This sheet is a summary. It may not cover all possible information. If you have questions about this medicine, talk to your doctor, pharmacist, or health care provider.  2018 Elsevier/Gold Standard (2015-04-25 11:20:42)  Diclofenac powder for oral solution What is this medicine? DICLOFENAC (dye KLOE fen ak) is a non-steroidal anti-inflammatory drug (NSAID). It is used to treat migraine pain. This medicine may be used for other purposes; ask your health care provider or pharmacist if you have questions. COMMON BRAND NAME(S): Cambia What should I tell my health care provider before I take this medicine? They need to know if you have any of these conditions: -asthma, especially aspirin sensitive asthma -coronary artery bypass graft (CABG) surgery within the past 2 weeks -drink more than 3 alcohol-containing drinks a day -heart disease or circulation problems like heart failure or leg edema (fluid retention) -high blood pressure -kidney disease -liver disease -phenylketonuria -stomach problems -an unusual or allergic reaction to diclofenac, aspirin, other NSAIDs, other medicines, foods, dyes, or preservatives -  pregnant or trying to get pregnant -breast-feeding How should I use this medicine? Mix this medicine with 1 to 2 ounces of water. Drink the medicine and water together. Follow the directions on the prescription label. Do not take your medicine more often than  directed. Long-term, continuous use may increase the risk of heart attack or stroke. A special MedGuide will be given to you by the pharmacist with each prescription and refill. Be sure to read this information carefully each time. Talk to your pediatrician regarding the use of this medicine in children. Special care may be needed. Elderly patients over 31 years old may have a stronger reaction and need a smaller dose. Overdosage: If you think you have taken too much of this medicine contact a poison control center or emergency room at once. NOTE: This medicine is only for you. Do not share this medicine with others. What if I miss a dose? This does not apply. What may interact with this medicine? Do not take this medicine with any of the following medications: -cidofovir -ketorolac -methotrexate This medicine may also interact with the following medications: -alcohol -aspirin and aspirin-like medicines -cyclosporine -diuretics -lithium -medicines for blood pressure -medicines for osteoporosis -medicines that affect platelets -medicines that treat or prevent blood clots like warfarin -NSAIDs, medicines for pain and inflammation, like ibuprofen or naproxen -pemetrexed -steroid medicines like prednisone or cortisone This list may not describe all possible interactions. Give your health care provider a list of all the medicines, herbs, non-prescription drugs, or dietary supplements you use. Also tell them if you smoke, drink alcohol, or use illegal drugs. Some items may interact with your medicine. What should I watch for while using this medicine? Tell your doctor or health care professional if your pain does not get better. Talk to your doctor before taking another medicine for pain. Do not treat yourself. This medicine does not prevent heart attack or stroke. In fact, this medicine may increase the chance of a heart attack or stroke. The chance may increase with longer use of this  medicine and in people who have heart disease. If you take aspirin to prevent heart attack or stroke, talk with your doctor or health care professional. Do not take medicines such as ibuprofen and naproxen with this medicine. Side effects such as stomach upset, nausea, or ulcers may be more likely to occur. Many medicines available without a prescription should not be taken with this medicine. This medicine can cause ulcers and bleeding in the stomach and intestines at any time during treatment. Do not smoke cigarettes or drink alcohol. These increase irritation to your stomach and can make it more susceptible to damage from this medicine. Ulcers and bleeding can happen without warning symptoms and can cause death. You may get drowsy or dizzy. Do not drive, use machinery, or do anything that needs mental alertness until you know how this medicine affects you. Do not stand or sit up quickly, especially if you are an older patient. This reduces the risk of dizzy or fainting spells. This medicine can cause you to bleed more easily. Try to avoid damage to your teeth and gums when you brush or floss your teeth. If you take migraine medicines for 10 or more days a month, your migraines may get worse. Keep a diary of headache days and medicine use. Contact your healthcare professional if your migraine attacks occur more frequently. What side effects may I notice from receiving this medicine? Side effects that you should report to your  doctor or health care professional as soon as possible: -allergic reactions like skin rash, itching or hives, swelling of the face, lips, or tongue -black or bloody stools, blood in the urine or vomit -blurred vision -chest pain -difficulty breathing or wheezing -nausea or vomiting -fever -redness, blistering, peeling or loosening of the skin, including inside the mouth -slurred speech or weakness on one side of the body -trouble passing urine or change in the amount of  urine -unexplained weight gain or swelling -unusually weak or tired -yellowing of eyes or skin Side effects that usually do not require medical attention (report to your doctor or health care professional if they continue or are bothersome): -constipation -diarrhea -dizziness -headache -heartburn This list may not describe all possible side effects. Call your doctor for medical advice about side effects. You may report side effects to FDA at 1-800-FDA-1088. Where should I keep my medicine? Keep out of the reach of children. Store at room temperature between 15 and 30 degrees C (59 and 86 degrees F). Throw away any unused medicine after the expiration date. NOTE: This sheet is a summary. It may not cover all possible information. If you have questions about this medicine, talk to your doctor, pharmacist, or health care provider.  2018 Elsevier/Gold Standard (2015-05-30 09:56:49)

## 2016-12-14 NOTE — Telephone Encounter (Signed)
Please send her pantoprazole to mail order as she requested if not already done.

## 2016-12-14 NOTE — Addendum Note (Signed)
Addended by: Damita LackLORING, DONNA S on: 12/14/2016 11:10 AM   Modules accepted: Orders

## 2016-12-14 NOTE — Progress Notes (Addendum)
[facility]    Provider:  Dr Lucia Gaskins Referring Provider: Excell Seltzer, MD Primary Care Physician:  Excell Seltzer, MD  CC:  Migraines  HPI:  Angela [name] is a 31 y.o. female here as a referral from Dr. Ermalene Searing for migraines. Past medical history of migraines, hypertension, anemia. Sister with migraines. Headaches worsening and started becoming very intense with nausea, vomiting, dizziness, blurry vision. Started in April with 2 weeks of hedaches. No inciting events or head trauma. There was stress at work however. Starts in the temples and behind the eys, can be unilateral and also in the back, pounding she can feel her veins and eyes about to pop out. Light and sound bothers her. She has nausea and vomiting. She has ear pain with the headaches but ear exam normal. She sees floaters sometime but no aura. She has 25 headache days a month, 15 are migrainous and can last up to 24 hours. She sometimes has morning headaches. No other focal neurologic deficits, associated symptoms, inciting events or modifiable factors. She was prescribed topiramate but never picked it up bc she read the side effects. She hs her fallopian tubes removed, no more children. No other focal neurologic deficits, associated symptoms, inciting events or modifiable factors.  Meds tried: Imitrex (made her feel weird), flexeril, Labetalol and Topiramate   Reviewed notes, labs and imaging from outside physicians, which showed:   hgba1c 5.6, CMP nml 09/2016  Reviewed notes, patient has been to primary care multiple times and a migraines. Treated with Zofran. She reported using ibuprofen and Imitrex. Acute headaches resolved with Imitrex. She reported headaches every other day in April of this year. Feels a band around her head that is squeezing, associated with nausea, blurred vision and dizziness, photo and phonophobia associated, also noted ear pressure. Triggers include increase in stress. Exam noted  that there was no papilledema. She was started on Topamax at bedtime. She did not fill this given fear of side effects. She is using Imitrex for severe headaches and using ibuprofen as well as. The month with headaches usually 1-2 hours but some up to 6-12 hours.    Review of Systems: Patient complains of symptoms per HPI as well as the following symptoms: blurred vision, ringing in ears Pertinent negatives and positives per HPI. All others negative.   Social History   Social History  . Marital status: Married    Spouse name: HASAN  . Number of children: 2  . Years of education: 14+   Occupational History  . SCHEDULING COORDINATOR [employer]   Social History Main Topics  . Smoking status: Never Smoker  . Smokeless tobacco: Never Used  . Alcohol use 1.8 oz/week    2 Glasses of wine, 1 Shots of liquor per week     Comment: weekends  . Drug use: No  . Sexual activity: Yes    Partners: Male    Birth control/ protection: Other-see comments     Comment: Ablation   Other Topics Concern  . Not on file   Social History Narrative   Lives at home w/ her husband and children   Right-handed   Caffeine: 1 cup coffee daily    Family History  Problem Relation Age of Onset  . Anesthesia problems Mother        NAUSEA AND VOMITING  . Thyroid disease Mother   . Fibromyalgia Mother   . Sarcoidosis Mother   . Depression Mother   . Other Mother  VARICOSE VEINS  . Anesthesia problems Other   . Diabetes Father   . Hypertension Sister   . Arthritis Maternal Grandmother   . Heart disease Maternal Grandmother   . Hypertension Maternal Grandmother   . Kidney disease Maternal Grandmother        FAILURE  . Cancer Maternal Grandfather        prostate  . Depression Maternal Aunt   . Sarcoidosis Maternal Aunt   . Asthma Cousin     Past Medical History:  Diagnosis Date  . Abnormal Pap smear AGE 1   COLPO LAST PAP 08/2011  . Anemia   . Complication of anesthesia    NAUSEA;  VOMITING; ITCHING; DIFFICULTY BREATHING  . Genital herpes   . GERD (gastroesophageal reflux disease) AGE 70   tums prn  . History of chicken pox   . Hypertension   . Infection AGE 65   CHLAMYDIA; GONORRHEA  . 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 Infection    BV  . Migraines   . Ovarian cyst rupture   . PONV (postoperative nausea and vomiting)   . Preterm labor   . Urinary incontinence     Past Surgical History:  Procedure Laterality Date  . CERVIX LESION DESTRUCTION    . CESAREAN SECTION  2005, 2010   x2  . CESAREAN SECTION WITH BILATERAL TUBAL LIGATION Bilateral 11/15/2012   Procedure: REPEAT CESAREAN SECTION WITH BILATERAL TUBAL LIGATION;  Surgeon: Kirkland Hun, MD;  Location: [facility];  Service: Obstetrics;  Laterality: Bilateral;  . COLPOSCOPY    . UNILATERAL SALPINGECTOMY Right 11/15/2012   Procedure: UNILATERAL SALPINGECTOMY;  Surgeon: Kirkland Hun, MD;  Location: [facility];  Service: Obstetrics;  Laterality: Right;  . WISDOM TOOTH EXTRACTION      Current Outpatient Prescriptions  Medication Sig Dispense Refill  . ibuprofen (ADVIL,MOTRIN) 800 MG tablet Take 1 tablet (800 mg total) by mouth every 8 (eight) hours as needed. 30 tablet 0  . losartan (COZAAR) 50 MG tablet TAKE 1 TABLET BY MOUTH  DAILY 90 tablet 1  . SUMAtriptan (IMITREX) 100 MG tablet Take 1 tablet (100 mg total) by mouth every 2 (two) hours as needed for migraine or headache. May repeat in 2 hours if headache persists or recurs. 10 tablet 1  . tolterodine (DETROL LA) 4 MG 24 hr capsule TAKE 1 CAPSULE BY MOUTH  DAILY 90 capsule 2  . valACYclovir (VALTREX) 1000 MG tablet Take 1 tablet (1,000 mg total) by mouth 2 (two) times daily. 20 tablet 0  . Diclofenac Potassium 50 MG PACK Take 50 mg by mouth once as needed. Take once daily as needed with headache onset. Please take with food 30 each 6  . eletriptan (RELPAX) 40 MG tablet Take 1 tablet (40 mg total) by mouth as needed for migraine or headache. May repeat in 2 hours if  headache persists or recurs. 10 tablet 6  . pantoprazole (PROTONIX) 40 MG tablet Take 1 tablet (40 mg total) by mouth daily. 90 tablet 3   No current facility-administered medications for this visit.     Allergies as of 12/14/2016 - Review Complete 12/14/2016  Allergen Reaction Noted  . Amoxicillin Hives 12/01/2010    Vitals: BP (!) 137/92   Pulse 76   Ht 5\' 3"  (1.6 m)   Wt 176 lb (79.8 kg)   BMI 31.18 kg/m  Last Weight:  Wt Readings from Last 1 Encounters:  12/14/16 176 lb (79.8 kg)   Last Height:   Ht  Readings from Last 1 Encounters:  12/14/16 5\' 3"  (1.6 m)    Physical exam: Exam: Gen: NAD, conversant, well nourised, obese, well groomed                     CV: RRR, no MRG. No Carotid Bruits. No peripheral edema, warm, nontender Eyes: Conjunctivae clear without exudates or hemorrhage  Neuro: Detailed Neurologic Exam  Speech:    Speech is normal; fluent and spontaneous with normal comprehension.  Cognition:    The patient is oriented to person, place, and time;     recent and remote memory intact;     language fluent;     normal attention, concentration,     fund of knowledge Cranial Nerves:    The pupils are equal, round, and reactive to light. The fundi are normal and spontaneous venous pulsations are present. Visual fields are full to finger confrontation. Extraocular movements are intact. Trigeminal sensation is intact and the muscles of mastication are normal. The face is symmetric. The palate elevates in the midline. Hearing intact. Voice is normal. Shoulder shrug is normal. The tongue has normal motion without fasciculations.   Coordination:    Normal finger to nose and heel to shin. Normal rapid alternating movements.   Gait:    Heel-toe and tandem gait are normal.   Motor Observation:    No asymmetry, no atrophy, and no involuntary movements noted. Tone:    Normal muscle tone.    Posture:    Posture is normal. normal erect    Strength:     Strength is V/V in the upper and lower limbs.      Sensation: intact to LT     Reflex Exam:  DTR's:    Deep tendon reflexes in the upper and lower extremities are normal bilaterally.   Toes:    The toes are downgoing bilaterally.   Clonus:    Clonus is absent.      Assessment/Plan:  This is a 31 year old patient with chronic migraines without aura. We had a long discussion today about migraines including acute management and preventative management. I recommended topiramate extended release. Also suggested nortriptyline, propranolol or gabapentin as options. Patient would like to research these options and get back to me. Discussed medication overuse and rebound headache, do not use with goody powders and all others more than 10 times a month. Try Relpax and Cambia for acute management.  Discussed To prevent or relieve headaches, try the following: Cool Compress. Lie down and place a cool compress on your head.  Avoid headache triggers. If certain foods or odors seem to have triggered your migraines in the past, avoid them. A headache diary might help you identify triggers.  Include physical activity in your daily routine. Try a daily walk or other moderate aerobic exercise.  Manage stress. Find healthy ways to cope with the stressors, such as delegating tasks on your to-do list.  Practice relaxation techniques. Try deep breathing, yoga, massage and visualization.  Eat regularly. Eating regularly scheduled meals and maintaining a healthy diet might help prevent headaches. Also, drink plenty of fluids.  Follow a regular sleep schedule. Sleep deprivation might contribute to headaches Consider biofeedback. With this mind-body technique, you learn to control certain bodily functions - such as muscle tension, heart rate and blood pressure - to prevent headaches or reduce headache pain.    Proceed to emergency room if you experience new or worsening symptoms or symptoms do not resolve, if you  have  new neurologic symptoms or if headache is severe, or for any concerning symptom.   Provided education and documentation from American headache Society toolbox including articles on: chronic migraine medication overuse headache, chronic migraines, prevention of migraines, behavioral and other nonpharmacologic treatments for headache.   Naomie Dean, MD  [facility] [address] [place], [place] [address]  Phone [phone] Fax [phone]

## 2016-12-16 MED ORDER — DICLOFENAC POTASSIUM(MIGRAINE) 50 MG PO PACK: 50.0000 mg | PACK | Freq: Once | ORAL | 6 refills | Status: DC | PRN

## 2017-01-26 ENCOUNTER — Telehealth: Payer: Self-pay | Admitting: Family Medicine

## 2017-01-26 NOTE — Telephone Encounter (Signed)
PT husband dropped off FMLA ppw to be filled out. She is requesting it faxed by Friday 9/21 but is aware it will be done asap but cannot guarantee completion by 9/21. I placed in blue folder on Robin's desk.

## 2017-01-27 ENCOUNTER — Telehealth: Payer: Self-pay | Admitting: Family Medicine

## 2017-01-27 DIAGNOSIS — Z0279 Encounter for issue of other medical certificate: Secondary | ICD-10-CM

## 2017-01-27 NOTE — Telephone Encounter (Signed)
[facility] Day - Client TELEPHONE ADVICE RECORD [facility]  Patient Name: Angela [name]  DOB: [birth year]    Initial Comment Caller states her blood pressure was at 134/94, had a migraine, and her vision is blurry.   Nurse Assessment  Nurse: Earlene Plater, RN, Lupita Leash Date/Time (Eastern Time): 01/27/2017 9:27:31 AM  Confirm and document reason for call. If symptomatic, describe symptoms. ---Caller states her blood pressure was at 134/94, had a migraine, and her vision is blurry. She is taking blood pressure medication as prescribed. Was 160/110 last night. Denies worse headache of her life. Still continues have symptoms. Had a dull headache yesterday morning. Hx. of migraines.  Does the patient have any new or worsening symptoms? ---Yes  Will a triage be completed? ---Yes  Related visit to physician within the last 2 weeks? ---No  Does the PT have any chronic conditions? (i.e. diabetes, asthma, etc.) ---Yes  List chronic conditions. ---High blood pressure.  Is the patient pregnant or possibly pregnant? (Ask all females between the ages of 19-55) ---No  Is this a behavioral health or substance abuse call? ---No     Guidelines    Guideline Title Affirmed Question Affirmed Notes  High Blood Pressure [1] Systolic BP >= 160 OR Diastolic >= 100 AND [2] cardiac or neurologic symptoms (e.g., chest pain, difficulty breathing, unsteady gait, blurred vision)    Final Disposition User   Go to ED Now Earlene Plater, RN, Lupita Leash    Referrals  [facility] - ED   Disagree/Comply: Comply

## 2017-01-27 NOTE — Telephone Encounter (Signed)
completed in outbo.

## 2017-01-27 NOTE — Telephone Encounter (Signed)
FMLA paperwork  In Dr Ermalene Searing in box  For review and signature

## 2017-01-27 NOTE — Telephone Encounter (Signed)
Noted pt to ER.

## 2017-01-28 ENCOUNTER — Encounter: Payer: Self-pay | Admitting: Neurology

## 2017-01-29 ENCOUNTER — Encounter: Payer: Self-pay | Admitting: Family Medicine

## 2017-01-29 ENCOUNTER — Ambulatory Visit (INDEPENDENT_AMBULATORY_CARE_PROVIDER_SITE_OTHER): Payer: Managed Care, Other (non HMO) | Admitting: Family Medicine

## 2017-01-29 VITALS — BP 148/100 | HR 79 | Temp 99.3°F | Ht 63.5 in | Wt 172.0 lb

## 2017-01-29 DIAGNOSIS — I10 Essential (primary) hypertension: Secondary | ICD-10-CM

## 2017-01-29 NOTE — Addendum Note (Signed)
Addended by: Gregery Na on: 01/29/2017 05:08 PM   Modules accepted: Orders

## 2017-01-29 NOTE — Assessment & Plan Note (Signed)
Unclear what  Is causing recent increase? Possibly stress.  Eval for liver, kidney thyrodi cbc issues.  Increase losartan to 100 mg daily. Follow up for re-check in 2 weeks.

## 2017-01-29 NOTE — Progress Notes (Signed)
   Subjective:    Patient ID: Angela [name], female    DOB: 1986-01-02, 31 y.o.   MRN: [id number]  HPI    31 year old female presents for follow up HTN. Noted BP elevated in last week. 143/94-161/110.  Hypertension:   Poor control despite losartan 50 mg daily Using medication without problems or lightheadedness:  none Chest pain with exertion: none Edema:none Short of breath: none Average home BPs: Other issues: She does have some headache.. Squeezing as opposed to usual throbbing  No vision change, no new neuro changes, no slurred speech  BP Readings from Last 3 Encounters:  01/29/17 (!) 148/100  12/14/16 (!) 137/92  10/08/16 130/83  Body mass index is 29.99 kg/m. Wt Readings from Last 3 Encounters:  01/29/17 172 lb (78 kg)  12/14/16 176 lb (79.8 kg)  10/08/16 175 lb 12 oz (79.7 kg)   She has been more stressed lately.  No change in diet.  No change in caffeine.. Only one cup of coffee.  Ibuprofen using off and on. She is on diclofenac prn.. Has not used lately She has not used relpax  Yet.  Social History /Family History/Past Medical History reviewed in detail and updated in EMR if needed. Blood pressure (!) 148/100, pulse 79, temperature 99.3 F (37.4 C), temperature source Oral, height 5' 3.5" (1.613 m), weight 172 lb (78 kg), SpO2 98 %.    Review of Systems  Constitutional: Negative for fatigue.  Psychiatric/Behavioral: Negative for dysphoric mood.       Objective:   Physical Exam  Constitutional: Vital signs are normal. She appears well-developed and well-nourished. She is cooperative.  Non-toxic appearance. She does not appear ill. No distress.  HENT:  Head: Normocephalic.  Right Ear: Hearing, tympanic membrane, external ear and ear canal normal. Tympanic membrane is not erythematous, not retracted and not bulging.  Left Ear: Hearing, tympanic membrane, external ear and ear canal normal. Tympanic membrane is not erythematous, not retracted and not  bulging.  Nose: No mucosal edema or rhinorrhea. Right sinus exhibits no maxillary sinus tenderness and no frontal sinus tenderness. Left sinus exhibits no maxillary sinus tenderness and no frontal sinus tenderness.  Mouth/Throat: Uvula is midline, oropharynx is clear and moist and mucous membranes are normal.  Eyes: Pupils are equal, round, and reactive to light. Conjunctivae, EOM and lids are normal. Lids are everted and swept, no foreign bodies found.  Neck: Trachea normal and normal range of motion. Neck supple. Carotid bruit is not present. No thyroid mass and no thyromegaly present.  Cardiovascular: Normal rate, regular rhythm, S1 normal, S2 normal, normal heart sounds, intact distal pulses and normal pulses.  Exam reveals no gallop and no friction rub.   No murmur heard. Pulmonary/Chest: Effort normal and breath sounds normal. No tachypnea. No respiratory distress. She has no decreased breath sounds. She has no wheezes. She has no rhonchi. She has no rales.  Abdominal: Soft. Normal appearance and bowel sounds are normal. There is no tenderness.  Neurological: She is alert.  Skin: Skin is warm, dry and intact. No rash noted.  Psychiatric: Her speech is normal and behavior is normal. Judgment and thought content normal. Her mood appears not anxious. Cognition and memory are normal. She does not exhibit a depressed mood.          Assessment & Plan:

## 2017-01-29 NOTE — Addendum Note (Signed)
Addended by: Gregery Na on: 01/29/2017 05:11 PM   Modules accepted: Orders

## 2017-01-29 NOTE — Patient Instructions (Addendum)
Stop ibuprofen. Please stop at the lab to have labs drawn. Increase losartan to 100 mg daily. Follow BP at home.

## 2017-01-30 LAB — TSH: TSH: 3.57 u[IU]/mL (ref 0.450–4.500)

## 2017-01-30 LAB — CBC WITH DIFFERENTIAL/PLATELET
BASOS: 0 %
Basophils Absolute: 0 10*3/uL (ref 0.0–0.2)
EOS (ABSOLUTE): 0.1 10*3/uL (ref 0.0–0.4)
EOS: 1 %
HEMATOCRIT: 36 % (ref 34.0–46.6)
Hemoglobin: 12 g/dL (ref 11.1–15.9)
IMMATURE GRANS (ABS): 0 10*3/uL (ref 0.0–0.1)
IMMATURE GRANULOCYTES: 1 %
Lymphocytes Absolute: 3.8 10*3/uL — ABNORMAL HIGH (ref 0.7–3.1)
Lymphs: 44 %
MCH: 29.9 pg (ref 26.6–33.0)
MCHC: 33.3 g/dL (ref 31.5–35.7)
MCV: 90 fL (ref 79–97)
MONOS ABS: 0.6 10*3/uL (ref 0.1–0.9)
Monocytes: 7 %
NEUTROS PCT: 47 %
Neutrophils Absolute: 4.1 10*3/uL (ref 1.4–7.0)
Platelets: 321 10*3/uL (ref 150–379)
RBC: 4.02 x10E6/uL (ref 3.77–5.28)
RDW: 13.3 % (ref 12.3–15.4)
WBC: 8.6 10*3/uL (ref 3.4–10.8)

## 2017-01-30 LAB — COMPREHENSIVE METABOLIC PANEL
A/G RATIO: 2 (ref 1.2–2.2)
ALT: 20 IU/L (ref 0–32)
AST: 21 IU/L (ref 0–40)
Albumin: 4.8 g/dL (ref 3.5–5.5)
Alkaline Phosphatase: 54 IU/L (ref 39–117)
BUN / CREAT RATIO: 13 (ref 9–23)
BUN: 10 mg/dL (ref 6–20)
Bilirubin Total: 0.3 mg/dL (ref 0.0–1.2)
CALCIUM: 10.1 mg/dL (ref 8.7–10.2)
CO2: 25 mmol/L (ref 20–29)
Chloride: 99 mmol/L (ref 96–106)
Creatinine, Ser: 0.77 mg/dL (ref 0.57–1.00)
GFR, EST AFRICAN AMERICAN: 118 mL/min/{1.73_m2} (ref >59)
GFR, EST NON AFRICAN AMERICAN: 102 mL/min/{1.73_m2} (ref >59)
GLOBULIN, TOTAL: 2.4 g/dL (ref 1.5–4.5)
Glucose: 92 mg/dL (ref 65–99)
POTASSIUM: 4.2 mmol/L (ref 3.5–5.2)
SODIUM: 139 mmol/L (ref 134–144)
TOTAL PROTEIN: 7.2 g/dL (ref 6.0–8.5)

## 2017-02-04 ENCOUNTER — Telehealth: Payer: Self-pay | Admitting: Neurology

## 2017-02-04 NOTE — Telephone Encounter (Signed)
Danielle, which he start the approval process for Botox for this patient please? We do give her a call and discuss with her as well before starting on the process and payment. Let me know if you need a Botox referral form, I asked Joni Reining to fill out.

## 2017-02-05 NOTE — Telephone Encounter (Signed)
Left message letting pt know paperwork had been faxed on 9/19. Copy for pt Copy for file Copy for billing Copy for scan

## 2017-02-05 NOTE — Telephone Encounter (Signed)
Botox form filled out and given to Dr. Lucia Gaskins to sign.

## 2017-02-12 ENCOUNTER — Ambulatory Visit (INDEPENDENT_AMBULATORY_CARE_PROVIDER_SITE_OTHER): Payer: Managed Care, Other (non HMO) | Admitting: Family Medicine

## 2017-02-12 ENCOUNTER — Encounter: Payer: Self-pay | Admitting: Family Medicine

## 2017-02-12 VITALS — BP 140/90 | HR 84 | Temp 98.8°F | Ht 63.5 in | Wt 171.2 lb

## 2017-02-12 DIAGNOSIS — I10 Essential (primary) hypertension: Secondary | ICD-10-CM

## 2017-02-12 DIAGNOSIS — R0789 Other chest pain: Secondary | ICD-10-CM | POA: Diagnosis not present

## 2017-02-12 MED ORDER — AMLODIPINE BESYLATE 5 MG PO TABS: 5.0000 mg | ORAL_TABLET | Freq: Every day | ORAL | 5 refills | Status: DC

## 2017-02-12 NOTE — Addendum Note (Signed)
Addended by: Damita Lack on: 02/12/2017 05:03 PM   Modules accepted: Orders

## 2017-02-12 NOTE — Assessment & Plan Note (Addendum)
Likely noncardiac pain  EKG:   Tachycardia, normal sinus rhythm, there are no previous tracings available for comparison.

## 2017-02-12 NOTE — Progress Notes (Signed)
   Subjective:    Patient ID: Angela [name], female    DOB: 06-22-1985, 31 y.o.   MRN: [id number]  HPI  31 year old female presents for 2 week follow up HTN  Hypertension:    At last OV 01/29/2017 .Marland Kitchen She stopped ibuprofen and increased her losartan to 100 mg daily. She has had some improvement in BPS BP Readings from Last 3 Encounters:  02/12/17 140/90  01/29/17 (!) 148/100  12/14/16 (!) 137/92  Using medication without problems or lightheadedness:  none Chest pain with exertion:none.Marland Kitchen occ has achy chest pain with moving arms.. Does work with upper body. Lasts several hours.. No associated symptoms. Edema:none Short of breath:none Average home BPs: 145/95 HR 80-90s. Other issues: 01/29/2017 Labs eval negative. Cbc, TSH and CMET. No eating  A lot of salt, may be worse with stress, no caffeine  Blood pressure 140/90, pulse 84, temperature 98.8 F (37.1 C), temperature source Oral, height 5' 3.5" (1.613 m), weight 171 lb 4 oz (77.7 kg).   Review of Systems  Constitutional: Negative for fatigue and fever.  HENT: Negative for ear pain.   Eyes: Negative for pain.  Respiratory: Negative for chest tightness and shortness of breath.   Cardiovascular: Negative for chest pain, palpitations and leg swelling.  Gastrointestinal: Negative for abdominal pain.  Genitourinary: Negative for dysuria.       Objective:   Physical Exam  Constitutional: Vital signs are normal. She appears well-developed and well-nourished. She is cooperative.  Non-toxic appearance. She does not appear ill. No distress.  HENT:  Head: Normocephalic.  Right Ear: Hearing, tympanic membrane, external ear and ear canal normal. Tympanic membrane is not erythematous, not retracted and not bulging.  Left Ear: Hearing, tympanic membrane, external ear and ear canal normal. Tympanic membrane is not erythematous, not retracted and not bulging.  Nose: No mucosal edema or rhinorrhea. Right sinus exhibits no maxillary sinus  tenderness and no frontal sinus tenderness. Left sinus exhibits no maxillary sinus tenderness and no frontal sinus tenderness.  Mouth/Throat: Uvula is midline, oropharynx is clear and moist and mucous membranes are normal.  Eyes: Pupils are equal, round, and reactive to light. Conjunctivae, EOM and lids are normal. Lids are everted and swept, no foreign bodies found.  Neck: Trachea normal and normal range of motion. Neck supple. Carotid bruit is not present. No thyroid mass and no thyromegaly present.  Cardiovascular: Regular rhythm, S1 normal, S2 normal, normal heart sounds, intact distal pulses and normal pulses.  Tachycardia present.  Exam reveals no gallop and no friction rub.   No murmur heard. Pulmonary/Chest: Effort normal and breath sounds normal. No tachypnea. No respiratory distress. She has no decreased breath sounds. She has no wheezes. She has no rhonchi. She has no rales.  Abdominal: Soft. Normal appearance and bowel sounds are normal. There is no tenderness.  Neurological: She is alert.  Skin: Skin is warm, dry and intact. No rash noted.  Psychiatric: Her speech is normal and behavior is normal. Judgment and thought content normal. Her mood appears not anxious. Cognition and memory are normal. She does not exhibit a depressed mood.          Assessment & Plan:

## 2017-02-12 NOTE — Assessment & Plan Note (Signed)
Improved with inadequate control with increase of losartan.Hennie Duos BMET to make sure K and Cr stable. Add amlodipine 5 mg daily.

## 2017-02-12 NOTE — Patient Instructions (Addendum)
Continue losartan 100 mg daily.  Add amlodipine 5 mg daily. Follow BP at home.. Call in 1-2 weeks with BP measurements. Goal < 140/90.  Please stop at the lab to have labs drawn.

## 2017-02-13 LAB — BASIC METABOLIC PANEL
BUN / CREAT RATIO: 16 (ref 9–23)
BUN: 13 mg/dL (ref 6–20)
CALCIUM: 9.7 mg/dL (ref 8.7–10.2)
CHLORIDE: 102 mmol/L (ref 96–106)
CO2: 25 mmol/L (ref 20–29)
Creatinine, Ser: 0.8 mg/dL (ref 0.57–1.00)
GFR calc non Af Amer: 98 mL/min/{1.73_m2} (ref >59)
GFR, EST AFRICAN AMERICAN: 113 mL/min/{1.73_m2} (ref >59)
GLUCOSE: 104 mg/dL — AB (ref 65–99)
POTASSIUM: 4.8 mmol/L (ref 3.5–5.2)
Sodium: 141 mmol/L (ref 134–144)

## 2017-02-27 ENCOUNTER — Other Ambulatory Visit: Payer: Self-pay | Admitting: Family Medicine

## 2017-03-01 ENCOUNTER — Encounter: Payer: Self-pay | Admitting: Family Medicine

## 2017-03-10 ENCOUNTER — Telehealth: Payer: Self-pay | Admitting: Neurology

## 2017-03-10 NOTE — Telephone Encounter (Signed)
I called to scheduled patient for botox and go over benefits and patient assistance. She did not answer so I left a message with someone in the home to have her call me back.

## 2017-03-15 ENCOUNTER — Encounter: Payer: Self-pay | Admitting: Neurology

## 2017-03-15 ENCOUNTER — Ambulatory Visit: Payer: Managed Care, Other (non HMO) | Admitting: Neurology

## 2017-03-15 VITALS — BP 135/87 | HR 97 | Wt 171.8 lb

## 2017-03-15 DIAGNOSIS — G43709 Chronic migraine without aura, not intractable, without status migrainosus: Secondary | ICD-10-CM

## 2017-03-15 NOTE — Progress Notes (Addendum)
[facility]    Provider:  Dr Lucia Gaskins Referring Provider: Excell Seltzer, MD Primary Care Physician:  Excell Seltzer, MD CC:  Migraines  Interval history: Discussion today with patient regarding Botox versus medications versus the new CGRP medications.  Patient would like to try the new CGRP medications. Provided sample today of Ajovy.  HPI:  Angela [name] is a 31 y.o. female here as a referral from Dr. Ermalene Searing for migraines. Past medical history of migraines, hypertension, anemia. Sister with migraines. Headaches worsening and started becoming very intense with nausea, vomiting, dizziness, blurry vision. Started in April with 2 weeks of hedaches. No inciting events or head trauma. There was stress at work however. Starts in the temples and behind the eys, can be unilateral and also in the back, pounding she can feel her veins and eyes about to pop out. Light and sound bothers her. She has nausea and vomiting. She has ear pain with the headaches but ear exam normal. She sees floaters sometime but no aura. She has 25 headache days a month, 15 are migrainous and can last up to 24 hours. She sometimes has morning headaches. No other focal neurologic deficits, associated symptoms, inciting events or modifiable factors. She was prescribed topiramate but never picked it up bc she read the side effects. She hs her fallopian tubes removed, no more children. No other focal neurologic deficits, associated symptoms, inciting events or modifiable factors.  Meds tried: Imitrex (made her feel weird), flexeril, Labetalol and Topiramate   Reviewed notes, labs and imaging from outside physicians, which showed:   hgba1c 5.6, CMP nml 09/2016  Reviewed notes, patient has been to primary care multiple times and a migraines. Treated with Zofran. She reported using ibuprofen and Imitrex. Acute headaches resolved with Imitrex. She reported headaches every other day in April of this year.  Feels a band around her head that is squeezing, associated with nausea, blurred vision and dizziness, photo and phonophobia associated, also noted ear pressure. Triggers include increase in stress. Exam noted that there was no papilledema. She was started on Topamax at bedtime. She did not fill this given fear of side effects. She is using Imitrex for severe headaches and using ibuprofen as well as. The month with headaches usually 1-2 hours but some up to 6-12 hours.    Review of Systems: Patient complains of symptoms per HPI as well as the following symptoms: no CP, no SOB. Pertinent negatives and positives per HPI. All others negative.   Social History   Socioeconomic History  . Marital status: Married    Spouse name: HASAN  . Number of children: 2  . Years of education: 14+  . Highest education level: Not on file  Social Needs  . Financial resource strain: Not on file  . Food insecurity - worry: Not on file  . Food insecurity - inability: Not on file  . Transportation needs - medical: Not on file  . Transportation needs - non-medical: Not on file  Occupational History  . Occupation: Retail banker: [employer]  Tobacco Use  . Smoking status: Never Smoker  . Smokeless tobacco: Never Used  Substance and Sexual Activity  . Alcohol use: Yes    Alcohol/week: 1.8 oz    Types: 2 Glasses of wine, 1 Shots of liquor per week    Comment: weekends  . Drug use: No  . Sexual activity: Yes    Partners: Male    Birth control/protection: Other-see comments  Comment: Ablation  Other Topics Concern  . Not on file  Social History Narrative   Lives at home w/ her husband and children   Right-handed   Caffeine: 1 cup coffee daily    Family History  Problem Relation Age of Onset  . Anesthesia problems Mother        NAUSEA AND VOMITING  . Thyroid disease Mother   . Fibromyalgia Mother   . Sarcoidosis Mother   . Depression Mother   . Other Mother         VARICOSE VEINS  . Anesthesia problems Other   . Diabetes Father   . Hypertension Sister   . Arthritis Maternal Grandmother   . Heart disease Maternal Grandmother   . Hypertension Maternal Grandmother   . Kidney disease Maternal Grandmother        FAILURE  . Cancer Maternal Grandfather        prostate  . Depression Maternal Aunt   . Sarcoidosis Maternal Aunt   . Asthma Cousin     Past Medical History:  Diagnosis Date  . Abnormal Pap smear AGE 10   COLPO LAST PAP 08/2011  . Anemia   . Complication of anesthesia    NAUSEA; VOMITING; ITCHING; DIFFICULTY BREATHING  . Genital herpes   . GERD (gastroesophageal reflux disease) AGE 56   tums prn  . History of chicken pox   . Hypertension   . Infection AGE 34   CHLAMYDIA; GONORRHEA  . 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 Infection    BV  . Migraines   . Ovarian cyst rupture   . PONV (postoperative nausea and vomiting)   . Preterm labor   . Urinary incontinence     Past Surgical History:  Procedure Laterality Date  . CERVIX LESION DESTRUCTION    . CESAREAN SECTION  2005, 2010   x2  . COLPOSCOPY    . WISDOM TOOTH EXTRACTION      Current Outpatient Medications  Medication Sig Dispense Refill  . amLODipine (NORVASC) 5 MG tablet Take 1 tablet (5 mg total) by mouth daily. 30 tablet 5  . Diclofenac Potassium 50 MG PACK Take 50 mg by mouth once as needed. Take once daily as needed with headache onset. Please take with food 30 each 6  . eletriptan (RELPAX) 40 MG tablet Take 1 tablet (40 mg total) by mouth as needed for migraine or headache. May repeat in 2 hours if headache persists or recurs. 10 tablet 6  . ibuprofen (ADVIL,MOTRIN) 800 MG tablet Take 1 tablet (800 mg total) by mouth every 8 (eight) hours as needed. 30 tablet 0  . losartan (COZAAR) 50 MG tablet Take 100 mg daily by mouth.    . pantoprazole (PROTONIX) 40 MG tablet Take 1 tablet (40 mg total) by mouth daily. 90 tablet 3  . tolterodine (DETROL LA) 4 MG 24 hr capsule TAKE 1  CAPSULE BY MOUTH  DAILY 90 capsule 1  . valACYclovir (VALTREX) 1000 MG tablet Take 1 tablet (1,000 mg total) by mouth 2 (two) times daily. 20 tablet 0   No current facility-administered medications for this visit.     Allergies as of 03/15/2017 - Review Complete 03/15/2017  Allergen Reaction Noted  . Amoxicillin Hives 12/01/2010    Vitals: BP 135/87 (BP Location: Right Arm, Patient Position: Sitting)   Pulse 97   Wt 171 lb 12.8 oz (77.9 kg)   BMI 29.96 kg/m  Last Weight:  Wt Readings from Last 1 Encounters:  03/15/17 171 lb 12.8 oz (77.9 kg)   Last Height:   Ht Readings from Last 1 Encounters:  02/12/17 5' 3.5" (1.613 m)   Physical exam: Exam: Gen: NAD, conversant, well nourised, obese, well groomed                     CV: RRR, no MRG. No Carotid Bruits. No peripheral edema, warm, nontender Eyes: Conjunctivae clear without exudates or hemorrhage  Neuro: Detailed Neurologic Exam  Speech:    Speech is normal; fluent and spontaneous with normal comprehension.  Cognition:    The patient is oriented to person, place, and time;     recent and remote memory intact;     language fluent;     normal attention, concentration,     fund of knowledge Cranial Nerves:    The pupils are equal, round, and reactive to light. The fundi are normal and spontaneous venous pulsations are present. Visual fields are full to finger confrontation. Extraocular movements are intact. Trigeminal sensation is intact and the muscles of mastication are normal. The face is symmetric. The palate elevates in the midline. Hearing intact. Voice is normal. Shoulder shrug is normal. The tongue has normal motion without fasciculations.   Coordination:    Normal finger to nose and heel to shin. Normal rapid alternating movements.   Gait:    Heel-toe and tandem gait are normal.   Motor Observation:    No asymmetry, no atrophy, and no involuntary movements noted. Tone:    Normal muscle tone.    Posture:     Posture is normal. normal erect    Strength:    Strength is V/V in the upper and lower limbs.      Sensation: intact to LT     Reflex Exam:  DTR's:    Deep tendon reflexes in the upper and lower extremities are normal bilaterally.   Toes:    The toes are downgoing bilaterally.   Clonus:    Clonus is absent.       Assessment/Plan:  This is a 30 year old patient with chronic migraines without aura. We had a long discussion today about migraines including acute management and preventative management. I recommended topiramate extended release. Also suggested nortriptyline, propranolol or gabapentin as options. Patient would like to research these options and get back to me. Discussed medication overuse and rebound headache, do not use with goody powders and all others more than 10 times a month. Try Relpax and Cambia for acute management.   Started Ajovy today, provided sample, cancelled botox appointment.  Discussed: To prevent or relieve headaches, try the following: Cool Compress. Lie down and place a cool compress on your head.  Avoid headache triggers. If certain foods or odors seem to have triggered your migraines in the past, avoid them. A headache diary might help you identify triggers.  Include physical activity in your daily routine. Try a daily walk or other moderate aerobic exercise.  Manage stress. Find healthy ways to cope with the stressors, such as delegating tasks on your to-do list.  Practice relaxation techniques. Try deep breathing, yoga, massage and visualization.  Eat regularly. Eating regularly scheduled meals and maintaining a healthy diet might help prevent headaches. Also, drink plenty of fluids.  Follow a regular sleep schedule. Sleep deprivation might contribute to headaches Consider biofeedback. With this mind-body technique, you learn to control certain bodily functions - such as muscle tension, heart rate and blood pressure - to prevent headaches or reduce  headache pain.    Proceed to emergency room if you experience new or worsening symptoms or symptoms do not resolve, if you have new neurologic symptoms or if headache is severe, or for any concerning symptom.   Provided education and documentation from American headache Society toolbox including articles on: chronic migraine medication overuse headache, chronic migraines, prevention of migraines, behavioral and other nonpharmacologic treatments for headache.    Naomie Dean, MD  [facility] [address] [place], [place] [address]  Phone [phone] Fax [phone]  A total of 25 minutes was spent in with this patient. Over half this time was spent on counseling patient on the migraine diagnosis and different therapeutic options available.

## 2017-03-17 ENCOUNTER — Telehealth: Payer: Self-pay | Admitting: *Deleted

## 2017-03-17 MED ORDER — NONFORMULARY OR COMPOUNDED ITEM: 1.0000 "application " | 11 refills | Status: DC

## 2017-03-17 NOTE — Telephone Encounter (Signed)
Called and LVM asking for call back.   I sent the Ajovy prescription to [facility] on [address]. Received a receipt of confirmation.  When patient calls back, please verify if patient ok with sending prescription there. Other pharmacy is [facility] on [address].

## 2017-03-17 NOTE — Addendum Note (Signed)
Addended by: Naomie DeanAHERN, ANTONIA B on: 03/17/2017 09:11 AM   Modules accepted: Orders, Level of Service

## 2017-03-18 ENCOUNTER — Telehealth: Payer: Self-pay | Admitting: *Deleted

## 2017-03-18 NOTE — Telephone Encounter (Signed)
PA for Ajovy completed. Determination expected within 72 hours. Key: [id number]  Patient aware of pending PA.

## 2017-03-22 NOTE — Telephone Encounter (Signed)
Called and LVM (ok per DPR) informing patient of denial of Ajovy. I informed her that she should still be able to get the Ajovy with the copay card and told her to call if she does not have the card. Left our office number.

## 2017-03-22 NOTE — Telephone Encounter (Signed)
This patient has Rosann [employer]uer[employer]a so she should get the bridge until 04/2018 will need to do an appeal at some point thanks

## 2017-03-22 NOTE — Telephone Encounter (Signed)
Received notification of denial of PA for Ajovy.

## 2017-03-22 NOTE — Telephone Encounter (Signed)
Faxed pt's appeal letter and [facility] # [phone]. Received a receipt of confirmation.

## 2017-03-23 ENCOUNTER — Ambulatory Visit: Payer: Managed Care, Other (non HMO) | Admitting: Neurology

## 2017-03-27 ENCOUNTER — Encounter: Payer: Self-pay | Admitting: Neurology

## 2017-03-29 NOTE — Telephone Encounter (Signed)
Received mychart message from patient expressing her appreciation of the appeal letter and that she had received notification of approval of Ajovy for 6 months.   Office is awaiting formal notification from pharmacy. Called [facility] to see if they are aware. Spoke with Abby; she stated that insurance approved & Ajovy will be 19.97 and ready for pickup tomorrow 11/20. Patient notified via mychart.

## 2017-04-02 ENCOUNTER — Other Ambulatory Visit: Payer: Self-pay | Admitting: Family Medicine

## 2017-04-05 ENCOUNTER — Encounter: Payer: Self-pay | Admitting: Family Medicine

## 2017-04-12 ENCOUNTER — Other Ambulatory Visit: Payer: Self-pay | Admitting: *Deleted

## 2017-04-12 MED ORDER — AMLODIPINE BESYLATE 5 MG PO TABS: 5.0000 mg | ORAL_TABLET | Freq: Every day | ORAL | 1 refills | Status: DC

## 2017-05-01 DIAGNOSIS — R079 Chest pain, unspecified: Secondary | ICD-10-CM | POA: Diagnosis not present

## 2017-05-01 DIAGNOSIS — I10 Essential (primary) hypertension: Secondary | ICD-10-CM | POA: Diagnosis not present

## 2017-05-01 DIAGNOSIS — Z79899 Other long term (current) drug therapy: Secondary | ICD-10-CM | POA: Insufficient documentation

## 2017-05-01 DIAGNOSIS — R002 Palpitations: Secondary | ICD-10-CM | POA: Insufficient documentation

## 2017-05-01 DIAGNOSIS — R51 Headache: Secondary | ICD-10-CM | POA: Diagnosis not present

## 2017-05-02 ENCOUNTER — Emergency Department (HOSPITAL_COMMUNITY)
Admission: EM | Admit: 2017-05-02 | Discharge: 2017-05-02 | Disposition: A | Discharge status: Home / Self Care | Payer: Managed Care, Other (non HMO) | Attending: Emergency Medicine | Admitting: Emergency Medicine

## 2017-05-02 ENCOUNTER — Encounter (HOSPITAL_COMMUNITY): Payer: Self-pay

## 2017-05-02 DIAGNOSIS — R002 Palpitations: Secondary | ICD-10-CM

## 2017-05-02 DIAGNOSIS — R079 Chest pain, unspecified: Secondary | ICD-10-CM

## 2017-05-02 LAB — I-STAT CHEM 8, ED
BUN: 4 mg/dL — ABNORMAL LOW (ref 6–20)
CALCIUM ION: 1.16 mmol/L (ref 1.15–1.40)
CREATININE: 0.6 mg/dL (ref 0.44–1.00)
Chloride: 102 mmol/L (ref 101–111)
GLUCOSE: 110 mg/dL — AB (ref 65–99)
HCT: 37 % (ref 36.0–46.0)
HEMOGLOBIN: 12.6 g/dL (ref 12.0–15.0)
Potassium: 3.4 mmol/L — ABNORMAL LOW (ref 3.5–5.1)
Sodium: 139 mmol/L (ref 135–145)
TCO2: 23 mmol/L (ref 22–32)

## 2017-05-02 LAB — I-STAT TROPONIN, ED: TROPONIN I, POC: 0 ng/mL (ref 0.00–0.08)

## 2017-05-02 MED ORDER — ASPIRIN 81 MG PO CHEW
324.0000 mg | CHEWABLE_TABLET | Freq: Once | ORAL | Status: AC
  Administered 2017-05-02: 324 mg via ORAL
  Filled 2017-05-02: qty 4

## 2017-05-02 MED ORDER — NAPROXEN 500 MG PO TABS
500.0000 mg | ORAL_TABLET | Freq: Once | ORAL | Status: AC
  Administered 2017-05-02: 500 mg via ORAL
  Filled 2017-05-02: qty 1

## 2017-05-02 MED ORDER — ACETAMINOPHEN 325 MG PO TABS
650.0000 mg | ORAL_TABLET | Freq: Once | ORAL | Status: AC
  Administered 2017-05-02: 650 mg via ORAL
  Filled 2017-05-02: qty 2

## 2017-05-02 NOTE — ED Provider Notes (Signed)
[facility]-EMERGENCY DEPT Provider Note   CSN: [id number] Arrival date & time: 05/01/17  2240     History   Chief Complaint Chief Complaint  Patient presents with  . Hypertension    HPI Angela [name] is a 31 y.o. female.  HPI Patient comes in with chief complaint of high blood pressure, palpitations, chest discomfort. Patient reports that she has history of migraines and hypertension.  Patient was recently adjusted on her hypertension medications.  This evening patient started having headaches along with palpitations and chest discomfort.  Patient decided to check her blood pressure and it was elevated in the 160 systolic range, that she decided to come to the ER.  Patient has had intermittent palpitations.  Chest discomfort is described as dullness on the left side.  Patient has no specific aggravating or relieving factors for the chest discomfort, specifically there is no exertional component to the pain.  Patient has no history of coronary artery disease. Pt has no hx of PE, DVT and denies any exogenous hormone (testosterone / estrogen) use, long distance travels or surgery in the past 6 weeks, active cancer, recent immobilization.   Past Medical History:  Diagnosis Date  . Abnormal Pap smear AGE 25   COLPO LAST PAP 08/2011  . Anemia   . Complication of anesthesia    NAUSEA; VOMITING; ITCHING; DIFFICULTY BREATHING  . Genital herpes   . GERD (gastroesophageal reflux disease) AGE 42   tums prn  . History of chicken pox   . Hypertension   . Infection AGE 37   CHLAMYDIA; GONORRHEA  . 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 Infection    BV  . Migraines   . Ovarian cyst rupture   . PONV (postoperative nausea and vomiting)   . Preterm labor   . Urinary incontinence     Patient Active Problem List   Diagnosis Date Noted  . Atypical chest pain 02/12/2017  . Migraine 08/11/2016  . Menorrhagia with regular cycle 05/14/2015  . HTN (hypertension), benign  05/14/2015  . Prediabetes 05/14/2015  . Overweight (BMI 25.0-29.9) 02/14/2015  . GERD (gastroesophageal reflux disease) 02/14/2015  . History of migraine 02/14/2015  . Urge incontinence 02/14/2015  . Genital herpes 02/14/2015  . S/P tubal ligation 11/16/2012    Past Surgical History:  Procedure Laterality Date  . CERVIX LESION DESTRUCTION    . CESAREAN SECTION  2005, 2010   x2  . CESAREAN SECTION WITH BILATERAL TUBAL LIGATION Bilateral 11/15/2012   Procedure: REPEAT CESAREAN SECTION WITH BILATERAL TUBAL LIGATION;  Surgeon: Kirkland HunArthur Stringer, MD;  Location: [facility];  Service: Obstetrics;  Laterality: Bilateral;  . COLPOSCOPY    . UNILATERAL SALPINGECTOMY Right 11/15/2012   Procedure: UNILATERAL SALPINGECTOMY;  Surgeon: Kirkland HunArthur Stringer, MD;  Location: [facility];  Service: Obstetrics;  Laterality: Right;  . WISDOM TOOTH EXTRACTION      OB History    Gravida Para Term Preterm AB Living   3 3 2 1   3    SAB TAB Ectopic Multiple Live Births           3      Obstetric Comments   2005 HYPEREMESIS 2010 MECONIUM ASPIRATION       Home Medications    Prior to Admission medications   Medication Sig Start Date End Date Taking? Authorizing Provider  amLODipine (NORVASC) 5 MG tablet Take 1 tablet (5 mg total) by mouth daily. 04/12/17  Yes Bedsole, Amy E, MD  eletriptan (RELPAX) 40 MG  tablet Take 1 tablet (40 mg total) by mouth as needed for migraine or headache. May repeat in 2 hours if headache persists or recurs. 12/14/16  Yes Anson Fret, MD  ibuprofen (ADVIL,MOTRIN) 800 MG tablet Take 1 tablet (800 mg total) by mouth every 8 (eight) hours as needed. Patient taking differently: Take 800 mg by mouth every 8 (eight) hours as needed for headache or moderate pain.  08/11/16  Yes Corwin Levins, MD  losartan (COZAAR) 50 MG tablet Take 2 tablets (100 mg total) by mouth daily. 04/04/17  Yes Bedsole, Amy E, MD  NONFORMULARY OR COMPOUNDED ITEM Inject 1 application every 30 (thirty) days into the skin.  03/17/17  Yes Anson Fret, MD  pantoprazole (PROTONIX) 40 MG tablet Take 1 tablet (40 mg total) by mouth daily. 12/14/16 12/14/17 Yes Bedsole, Amy E, MD  tolterodine (DETROL LA) 4 MG 24 hr capsule TAKE 1 CAPSULE BY MOUTH  DAILY 03/01/17  Yes Bedsole, Amy E, MD  Diclofenac Potassium 50 MG PACK Take 50 mg by mouth once as needed. Take once daily as needed with headache onset. Please take with food 12/16/16   Anson Fret, MD  valACYclovir (VALTREX) 1000 MG tablet Take 1 tablet (1,000 mg total) by mouth 2 (two) times daily. Patient not taking: Reported on 05/02/2017 11/30/16   Excell Seltzer, MD    Family History Family History  Problem Relation Age of Onset  . Anesthesia problems Mother        NAUSEA AND VOMITING  . Thyroid disease Mother   . Fibromyalgia Mother   . Sarcoidosis Mother   . Depression Mother   . Other Mother        VARICOSE VEINS  . Anesthesia problems Other   . Diabetes Father   . Hypertension Sister   . Arthritis Maternal Grandmother   . Heart disease Maternal Grandmother   . Hypertension Maternal Grandmother   . Kidney disease Maternal Grandmother        FAILURE  . Cancer Maternal Grandfather        prostate  . Depression Maternal Aunt   . Sarcoidosis Maternal Aunt   . Asthma Cousin     Social History Social History   Tobacco Use  . Smoking status: Never Smoker  . Smokeless tobacco: Never Used  Substance Use Topics  . Alcohol use: Yes    Alcohol/week: 1.8 oz    Types: 2 Glasses of wine, 1 Shots of liquor per week    Comment: weekends  . Drug use: No     Allergies   Amoxicillin   Review of Systems Review of Systems  Constitutional: Positive for activity change.  Respiratory: Positive for chest tightness.   Cardiovascular: Positive for palpitations.  Neurological: Positive for headaches.  All other systems reviewed and are negative.    Physical Exam Updated Vital Signs BP 135/80 (BP Location: Left Arm)   Pulse 95   Temp 98.3 F (36.8  C) (Oral)   Resp 16   SpO2 100%   Physical Exam  Constitutional: She is oriented to person, place, and time. She appears well-developed.  HENT:  Head: Normocephalic and atraumatic.  Eyes: EOM are normal. Pupils are equal, round, and reactive to light.  Neck: Normal range of motion. Neck supple.  Cardiovascular: Normal rate and regular rhythm.  Pulmonary/Chest: Effort normal and breath sounds normal. No respiratory distress.  Abdominal: Bowel sounds are normal.  Neurological: She is alert and oriented to person, place, and time. No  cranial nerve deficit.  Skin: Skin is warm and dry.  Nursing note and vitals reviewed.    ED Treatments / Results  Labs (all labs ordered are listed, but only abnormal results are displayed) Labs Reviewed  I-STAT CHEM 8, ED - Abnormal; Notable for the following components:      Result Value   Potassium 3.4 (*)    BUN 4 (*)    Glucose, Bld 110 (*)    All other components within normal limits  I-STAT TROPONIN, ED    EKG  EKG Interpretation  Date/Time:  Sunday May 02 2017 04:19:16 EST Ventricular Rate:  80 PR Interval:    QRS Duration: 100 QT Interval:  378 QTC Calculation: 436 R Axis:   83 Text Interpretation:  Sinus rhythm Prolonged PR interval No acute changes No significant change since last tracing Confirmed by Derwood KaplanNanavati, Ediberto Sens [phone]([id number]) on 05/02/2017 4:56:49 AM       Radiology No results found.  Procedures Procedures (including critical care time)  Medications Ordered in ED Medications  acetaminophen (TYLENOL) tablet 650 mg (650 mg Oral Given 05/02/17 0300)  naproxen (NAPROSYN) tablet 500 mg (500 mg Oral Given 05/02/17 0300)  aspirin chewable tablet 324 mg (324 mg Oral Given 05/02/17 0453)     Initial Impression / Assessment and Plan / ED Course  I have reviewed the triage vital signs and the nursing notes.  Pertinent labs & imaging results that were available during my care of the patient were reviewed by me and  considered in my medical decision making (see chart for details).     Patient comes in with chief complaint of palpitation and chest discomfort.  Patient also noted elevated blood pressure.  Patient has history of migraines and she is having her migrainous headaches.  I suspect that the headaches led to elevated blood pressure.  In the ED patient's blood pressure are noted to be in 140-150 systolic range.  Patient has a PCP who is been adjusting her medications, and there is no indication for emergent adjustment of BP meds.  Patient has been having some atypical chest discomfort and palpitations.  EKG is reassuring.  Patient was monitored over telemetry for 2 hours, and there was no dysrhythmia recorded.  Patient's initial troponin is negative, and we will not be getting repeat troponin since her chest discomfort is extremely atypical, and the pretest probability for ACS being the etiology is very low.  Final Clinical Impressions(s) / ED Diagnoses   Final diagnoses:  Palpitations  Nonspecific chest pain    ED Discharge Orders    None       Derwood KaplanNanavati, [place], MD 05/02/17 [phone]

## 2017-05-02 NOTE — Discharge Instructions (Signed)
We saw you in the ER for the palpitations, chest discomfort, headache. All of our cardiac workup is normal, including labs, EKG are normal. We are not sure what is causing your discomfort, but we feel comfortable sending you home at this time. The workup in the ER is not complete, and you should follow up with your primary care doctor for further evaluation.  Please return to the ER if you have worsening chest pain, shortness of breath, pain radiating to your jaw, shoulder, or back, sweats or fainting. Otherwise see the Cardiologist or your primary care doctor as requested.

## 2017-05-02 NOTE — ED Triage Notes (Signed)
Pt complains of a migraine headache this afternoon and her blood pressure was elevated and she states that she was feeling jittery and having a achy chest pain that is radiating to her back

## 2017-05-18 ENCOUNTER — Other Ambulatory Visit: Payer: Self-pay

## 2017-05-18 ENCOUNTER — Ambulatory Visit: Payer: Managed Care, Other (non HMO) | Admitting: Family Medicine

## 2017-05-18 ENCOUNTER — Encounter: Payer: Self-pay | Admitting: Family Medicine

## 2017-05-18 VITALS — BP 124/60 | HR 106 | Temp 99.3°F | Ht 63.5 in | Wt 169.0 lb

## 2017-05-18 DIAGNOSIS — I10 Essential (primary) hypertension: Secondary | ICD-10-CM | POA: Diagnosis not present

## 2017-05-18 DIAGNOSIS — R0789 Other chest pain: Secondary | ICD-10-CM | POA: Diagnosis not present

## 2017-05-18 DIAGNOSIS — R002 Palpitations: Secondary | ICD-10-CM | POA: Diagnosis not present

## 2017-05-18 MED ORDER — AMLODIPINE BESYLATE 10 MG PO TABS: 10.0000 mg | ORAL_TABLET | Freq: Every day | ORAL | 1 refills | Status: DC

## 2017-05-18 NOTE — Patient Instructions (Signed)
Continue chest wall and neck stretches.. Can try heat on neck.  If chest wall pain and neck pain not improving .Marland Kitchen. Consider 3 days of ibuprofen 800 mg every 8 hours.  Continue high dose of amlodipine. Please stop at the front desk to set up referral.

## 2017-05-18 NOTE — Progress Notes (Signed)
Subjective:    Patient ID: Angela [name], female    DOB: 09/07/1985, 32 y.o.   MRN: [id number]  HPI   32 year old female presents for ER follow up.  Seen on 12/23 for  Palpitations, chest discomfort and high BP In the ED patient's blood pressure are noted to be in 140-150 systolic range. HR 85-95.. At home 120.  EKG was reassuring. 2 hours of telemetry nml  troponin x 1 neg   She continued to have fast heart rate. No skipping beats.  Occ sharp jolt of pain in chest, radiates up neck. Off and on through the day.  No exertional pain.  No SOB.   Now on higher dose of losartan.Marland Kitchen. Has increased to amlodipine 10 mg since BPs remained high.  No SE to either med.  no swelling in ankles.   She feels jittery and anxious but only after heart already beating fast.  She drinks minimal caffeine.   Using ibuprofen 3 at a time for chest pain.. Radiates to left neck and numbness in arms.  Started  ajovy 3 months ago.. for migraine...  No SE.   CMET, TSH, cbc nml in 01/2017.  Blood pressure 124/60, pulse (!) 106, temperature 99.3 F (37.4 C), temperature source Oral, height 5' 3.5" (1.613 m), weight 169 lb (76.7 kg).    Review of Systems  Constitutional: Negative for fatigue and fever.  HENT: Negative for congestion.   Eyes: Negative for pain.  Respiratory: Negative for cough and shortness of breath.   Cardiovascular: Positive for chest pain and palpitations. Negative for leg swelling.  Gastrointestinal: Negative for abdominal pain.  Genitourinary: Negative for dysuria and vaginal bleeding.  Musculoskeletal: Negative for back pain.  Neurological: Negative for syncope, light-headedness and headaches.  Psychiatric/Behavioral: Negative for dysphoric mood.       Objective:   Physical Exam  Constitutional: Vital signs are normal. She appears well-developed and well-nourished. She is cooperative.  Non-toxic appearance. She does not appear ill. No distress.  HENT:  Head:  Normocephalic.  Right Ear: Hearing, tympanic membrane, external ear and ear canal normal. Tympanic membrane is not erythematous, not retracted and not bulging.  Left Ear: Hearing, tympanic membrane, external ear and ear canal normal. Tympanic membrane is not erythematous, not retracted and not bulging.  Nose: No mucosal edema or rhinorrhea. Right sinus exhibits no maxillary sinus tenderness and no frontal sinus tenderness. Left sinus exhibits no maxillary sinus tenderness and no frontal sinus tenderness.  Mouth/Throat: Uvula is midline, oropharynx is clear and moist and mucous membranes are normal.  Eyes: Conjunctivae, EOM and lids are normal. Pupils are equal, round, and reactive to light. Lids are everted and swept, no foreign bodies found.  Neck: Trachea normal and normal range of motion. Neck supple. Carotid bruit is not present. No thyroid mass and no thyromegaly present.  Cardiovascular: Regular rhythm, S1 normal, S2 normal, normal heart sounds, intact distal pulses and normal pulses. Tachycardia present. Exam reveals no gallop and no friction rub.  No murmur heard. Pulmonary/Chest: Effort normal and breath sounds normal. No tachypnea. No respiratory distress. She has no decreased breath sounds. She has no wheezes. She has no rhonchi. She has no rales.  Abdominal: Soft. Normal appearance and bowel sounds are normal. There is no tenderness.  Neurological: She is alert.  Skin: Skin is warm, dry and intact. No rash noted.  Psychiatric: Her speech is normal and behavior is normal. Judgment and thought content normal. Her mood appears not anxious. Cognition and  memory are normal. She does not exhibit a depressed mood.          Assessment & Plan:

## 2017-05-20 ENCOUNTER — Other Ambulatory Visit: Payer: Self-pay | Admitting: Internal Medicine

## 2017-05-21 MED ORDER — IBUPROFEN 800 MG PO TABS: 800.0000 mg | ORAL_TABLET | Freq: Three times a day (TID) | ORAL | 0 refills | Status: DC | PRN

## 2017-06-10 ENCOUNTER — Encounter: Payer: Self-pay | Admitting: Cardiology

## 2017-06-15 DIAGNOSIS — R002 Palpitations: Secondary | ICD-10-CM | POA: Insufficient documentation

## 2017-06-15 NOTE — Assessment & Plan Note (Signed)
Improved BP control on 10 mg of amlodipine daily. Encouraged exercise, weight loss, healthy eating habits.

## 2017-06-15 NOTE — Assessment & Plan Note (Signed)
Most consistent with chest wall pain.  ER eval negative. Continue chest wall stretches.

## 2017-06-15 NOTE — Assessment & Plan Note (Signed)
Refer to cardiology for further eval, consideration of holter monitor. Stop all caffeine and avoid supplements.  Recent tsh, cbc,cmet nml.

## 2017-06-18 ENCOUNTER — Ambulatory Visit: Payer: Managed Care, Other (non HMO) | Admitting: Cardiology

## 2017-06-18 ENCOUNTER — Encounter: Payer: Self-pay | Admitting: Cardiology

## 2017-06-18 VITALS — BP 148/84 | HR 105 | Ht 63.0 in | Wt 167.2 lb

## 2017-06-18 DIAGNOSIS — R079 Chest pain, unspecified: Secondary | ICD-10-CM | POA: Diagnosis not present

## 2017-06-18 DIAGNOSIS — R002 Palpitations: Secondary | ICD-10-CM

## 2017-06-18 DIAGNOSIS — I10 Essential (primary) hypertension: Secondary | ICD-10-CM | POA: Diagnosis not present

## 2017-06-18 NOTE — Progress Notes (Signed)
Cardiology Office Note    Date:  06/18/2017   ID:  Angela LemmaVaniqua S Eubanks, DOB 01/22/1986, MRN [id number]  PCP:  Excell SeltzerBedsole, Amy E, MD  Cardiologist:  Tobias AlexanderKatarina Dyrell Tuccillo, MD   Chief complain: Palpitation chest pain  History of Present Illness:  Angela [name] is a 32 y.o. female with prior medical history of high blood pressure, palpitations, chest discomfort.Patient reports that she has history of migraines and hypertension.  Patient was recently adjusted on her hypertension medications.  This evening patient started having headaches along with palpitations and chest discomfort.  Patient decided to check her blood pressure and it was elevated in the 160 systolic range, that she decided to come to the ER.  Patient has had intermittent palpitations.  Chest discomfort is described as dullness on the left side.  Patient has no specific aggravating or relieving factors for the chest discomfort, specifically there is no exertional component to the pain.  Patient has no history of coronary artery disease. Pt has no hx of PE, DVT and denies any exogenous hormone (testosterone / estrogen) use, long distance travels or surgery in the past 6 weeks, active cancer, recent immobilization.  In the ER her troponin was negative, blood work was normal, EKG showed normal sinus rhythm, and she was discharged home. She describes that she started to have high blood pressure after her last pregnancy 4 years ago but over time her medication had to be significantly increased, currently on Norvasc 10 mg daily and losartan 100 mg daily. Both of her parents have high blood pressure. She only had one episode of sudden onset chest pain palpitations dizziness that led to the ER visit. Other than that she gets occasional chest pains at rest. She is fairly active place based on basketball with her voice age 32 8 and 3 and doesn't get chest pain. When she gets herpetic chest pain it radiates to her back and below her left armpit. She  denies any syncope.  Past Medical History:  Diagnosis Date  . Abnormal Pap smear AGE 70   COLPO LAST PAP 08/2011  . Anemia   . Complication of anesthesia    NAUSEA; VOMITING; ITCHING; DIFFICULTY BREATHING  . Genital herpes   . GERD (gastroesophageal reflux disease) AGE 72   tums prn  . History of chicken pox   . Hypertension   . Infection AGE 85   CHLAMYDIA; GONORRHEA  . 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 Infection    BV  . Migraines   . Ovarian cyst rupture   . PONV (postoperative nausea and vomiting)   . Preterm labor   . Urinary incontinence     Past Surgical History:  Procedure Laterality Date  . CERVIX LESION DESTRUCTION    . CESAREAN SECTION  2005, 2010   x2  . CESAREAN SECTION WITH BILATERAL TUBAL LIGATION Bilateral 11/15/2012   Procedure: REPEAT CESAREAN SECTION WITH BILATERAL TUBAL LIGATION;  Surgeon: Kirkland HunArthur Stringer, MD;  Location: [facility];  Service: Obstetrics;  Laterality: Bilateral;  . COLPOSCOPY    . UNILATERAL SALPINGECTOMY Right 11/15/2012   Procedure: UNILATERAL SALPINGECTOMY;  Surgeon: Kirkland HunArthur Stringer, MD;  Location: [facility];  Service: Obstetrics;  Laterality: Right;  . WISDOM TOOTH EXTRACTION      Current Medications: Outpatient Medications Prior to Visit  Medication Sig Dispense Refill  . AJOVY 225 MG/1.5ML SOSY INJECT 1 DOSE EVERY 30 DAYS AS DIRECTED  11  . amLODipine (NORVASC) 10 MG tablet Take 1 tablet (10 mg total) by  mouth daily. 90 tablet 1  . Diclofenac Potassium 50 MG PACK Take 50 mg by mouth once as needed. Take once daily as needed with headache onset. Please take with food 30 each 6  . eletriptan (RELPAX) 40 MG tablet Take 1 tablet (40 mg total) by mouth as needed for migraine or headache. May repeat in 2 hours if headache persists or recurs. 10 tablet 6  . ibuprofen (ADVIL,MOTRIN) 800 MG tablet Take 1 tablet (800 mg total) by mouth every 8 (eight) hours as needed. 30 tablet 0  . losartan (COZAAR) 50 MG tablet Take 2 tablets (100 mg total) by mouth  daily. 180 tablet 1  . pantoprazole (PROTONIX) 40 MG tablet Take 1 tablet (40 mg total) by mouth daily. 90 tablet 3  . tolterodine (DETROL LA) 4 MG 24 hr capsule TAKE 1 CAPSULE BY MOUTH  DAILY 90 capsule 1  . valACYclovir (VALTREX) 1000 MG tablet Take 1 tablet (1,000 mg total) by mouth 2 (two) times daily. 20 tablet 0  . NONFORMULARY OR COMPOUNDED ITEM Inject 1 application every 30 (thirty) days into the skin. (Patient not taking: Reported on 06/18/2017) 1 each 11   No facility-administered medications prior to visit.      Allergies:   Amoxicillin   Social History   Socioeconomic History  . Marital status: Married    Spouse name: HASAN  . Number of children: 2  . Years of education: 14+  . Highest education level: None  Social Needs  . Financial resource strain: None  . Food insecurity - worry: None  . Food insecurity - inability: None  . Transportation needs - medical: None  . Transportation needs - non-medical: None  Occupational History  . Occupation: Retail banker: [employer]  Tobacco Use  . Smoking status: Never Smoker  . Smokeless tobacco: Never Used  Substance and Sexual Activity  . Alcohol use: Yes    Alcohol/week: 1.8 oz    Types: 2 Glasses of wine, 1 Shots of liquor per week    Comment: weekends  . Drug use: No  . Sexual activity: Yes    Partners: Male    Birth control/protection: Other-see comments    Comment: Ablation  Other Topics Concern  . None  Social History Narrative   Lives at home w/ her husband and children   Right-handed   Caffeine: 1 cup coffee daily     Family History:  The patient's family history includes Anesthesia problems in her mother and other; Arthritis in her maternal grandmother; Asthma in her cousin; Cancer in her maternal grandfather; Depression in her maternal aunt and mother; Diabetes in her father; Fibromyalgia in her mother; Heart disease in her maternal grandmother; Hypertension in her maternal grandmother and  sister; Kidney disease in her maternal grandmother; Other in her mother; Sarcoidosis in her maternal aunt and mother; Thyroid disease in her mother.   ROS:   Please see the history of present illness.    ROS All other systems reviewed and are negative.   PHYSICAL EXAM:   VS:  BP (!) 148/84   Pulse (!) 105   Ht 5\' 3"  (1.6 m)   Wt 167 lb 4 oz (75.9 kg)   SpO2 99%   BMI 29.63 kg/m    GEN: Well nourished, well developed, in no acute distress  HEENT: normal  Neck: no JVD, carotid bruits, or masses Cardiac: RRR; no murmurs, rubs, or gallops,no edema  Respiratory:  clear to auscultation bilaterally, normal work  of breathing GI: soft, nontender, nondistended, + BS MS: no deformity or atrophy  Skin: warm and dry, no rash Neuro:  Alert and Oriented x 3, Strength and sensation are intact Psych: euthymic mood, full affect  Wt Readings from Last 3 Encounters:  06/18/17 167 lb 4 oz (75.9 kg)  05/18/17 169 lb (76.7 kg)  03/15/17 171 lb 12.8 oz (77.9 kg)      Studies/Labs Reviewed:   EKG:  EKG is not ordered today.    Recent Labs: 01/29/2017: ALT 20; Platelets 321; TSH 3.570 05/02/2017: BUN 4; Creatinine, Ser 0.60; Hemoglobin 12.6; Potassium 3.4; Sodium 139   Lipid Panel    Component Value Date/Time   CHOL 137 10/03/2016 1107   TRIG 41 10/03/2016 1107   HDL 59 10/03/2016 1107   CHOLHDL 2.3 10/03/2016 1107   LDLCALC 70 10/03/2016 1107    Additional studies/ records that were reviewed today include:     ASSESSMENT:    1. Hypertension, unspecified type   2. Palpitations   3. Chest pain, unspecified type      PLAN:  In order of problems listed above:  1. Hypertension with high dose of antihypertensives in the very young female. We will obtain an echocardiogram to evaluate for LVEF and degree of LVH. She doesn't have bruit in both renal arteries, pheochromocytoma is rather low on differential she only had one episode of sudden onset chest pain hold flash. Her electrolytes  are also normal and so is her kidney function which use of losartan. We will obtain an stress echocardiogram that will assess her blood pressure response to exertion and adjust blood pressure medications appropriately. 2. Palpitations and chest pain, in a premenopausal young female, most probably SVT, we will obtain a stress echocardiogram to evaluate for ischemia and for any possible inducible arrhythmias.  Medication Adjustments/Labs and Tests Ordered: Current medicines are reviewed at length with the patient today.  Concerns regarding medicines are outlined above.  Medication changes, Labs and Tests ordered today are listed in the Patient Instructions below. Patient Instructions  Medication Instructions:  Your provider recommends that you continue on your current medications as directed. Please refer to the Current Medication list given to you today.    Labwork: None  Testing/Procedures: Your provider has requested that you have an echocardiogram. Echocardiography is a painless test that uses sound waves to create images of your heart. It provides your doctor with information about the size and shape of your heart and how well your heart's chambers and valves are working. This procedure takes approximately one hour. There are no restrictions for this procedure.  Your physician has requested that you have a stress echocardiogram. For further information please visit [email or web address] Please follow instruction sheet as given.  Follow-Up: Your provider recommends that you schedule a follow-up appointment in 3-4 months with Dr. Delton See.  Any Other Special Instructions Will Be Listed Below (If Applicable).     If you need a refill on your cardiac medications before your next appointment, please call your pharmacy.      Signed, Tobias Alexander, MD  06/18/2017 4:38 PM    [facility] [address] [place], [place], [place]  [address] Phone: [phone]; Fax: [phone]

## 2017-06-18 NOTE — Patient Instructions (Signed)
Medication Instructions:  Your provider recommends that you continue on your current medications as directed. Please refer to the Current Medication list given to you today.    Labwork: None  Testing/Procedures: Your provider has requested that you have an echocardiogram. Echocardiography is a painless test that uses sound waves to create images of your heart. It provides your doctor with information about the size and shape of your heart and how well your heart's chambers and valves are working. This procedure takes approximately one hour. There are no restrictions for this procedure.  Your physician has requested that you have a stress echocardiogram. For further information please visit [email or web address]. Please follow instruction sheet as given.  Follow-Up: Your provider recommends that you schedule a follow-up appointment in 3-4 months with Dr. Delton SeeNelson.  Any Other Special Instructions Will Be Listed Below (If Applicable).     If you need a refill on your cardiac medications before your next appointment, please call your pharmacy.

## 2017-06-21 ENCOUNTER — Telehealth (HOSPITAL_COMMUNITY): Payer: Self-pay | Admitting: *Deleted

## 2017-06-21 NOTE — Telephone Encounter (Signed)
Patient given detailed instructions per Stress Test Requisition Sheet for test on 214/19 at 7:30.Patient Notified to arrive 30 minutes early, and that it is imperative to arrive on time for appointment to keep from having the test rescheduled.  Patient verbalized understanding. Angela [name] ° °  ° °

## 2017-06-23 ENCOUNTER — Ambulatory Visit (HOSPITAL_COMMUNITY): Payer: Managed Care, Other (non HMO) | Attending: Cardiovascular Disease

## 2017-06-23 ENCOUNTER — Other Ambulatory Visit: Payer: Self-pay

## 2017-06-23 DIAGNOSIS — R079 Chest pain, unspecified: Secondary | ICD-10-CM | POA: Diagnosis not present

## 2017-06-23 DIAGNOSIS — I10 Essential (primary) hypertension: Secondary | ICD-10-CM | POA: Diagnosis not present

## 2017-06-23 DIAGNOSIS — R002 Palpitations: Secondary | ICD-10-CM | POA: Diagnosis not present

## 2017-06-23 LAB — ECHOCARDIOGRAM COMPLETE
Ao-asc: 29 cm
E decel time: 160 msec
E/e' ratio: 4.43
FS: 40 % (ref 28–44)
IVS/LV PW RATIO, ED: 0.7
LA ID, A-P, ES: 28 mm
LA diam end sys: 28 mm
LA diam index: 1.56 cm/m2
LA vol A4C: 23.2 ml
LA vol index: 14.2 mL/m2
LA vol: 25.5 mL
LV E/e' medial: 4.43
LV E/e'average: 4.43
LV PW d: 10 mm — AB (ref 0.6–1.1)
LV e' LATERAL: 17.4 cm/s
LVOT SV: 91 mL
LVOT VTI: 20.1 cm
LVOT area: 4.52 cm2
LVOT diameter: 24 mm
LVOT peak vel: 109 cm/s
Lateral S' vel: 12.3 cm/s
MV Dec: 160
MV Peak grad: 2 mmHg
MV pk A vel: 70 m/s
MV pk E vel: 77.1 m/s
TAPSE: 24.8 mm
TDI e' lateral: 17.4
TDI e' medial: 11

## 2017-06-24 ENCOUNTER — Telehealth: Payer: Self-pay | Admitting: Cardiology

## 2017-06-24 ENCOUNTER — Other Ambulatory Visit (HOSPITAL_COMMUNITY): Payer: Managed Care, Other (non HMO)

## 2017-06-24 DIAGNOSIS — R002 Palpitations: Secondary | ICD-10-CM

## 2017-06-24 DIAGNOSIS — I10 Essential (primary) hypertension: Secondary | ICD-10-CM

## 2017-06-24 DIAGNOSIS — R0789 Other chest pain: Secondary | ICD-10-CM

## 2017-06-24 NOTE — Telephone Encounter (Signed)
-----   Message from Lars MassonKatarina H Nelson, MD sent at 06/23/2017  5:38 PM EST ----- Regarding: RE: Precert denied Please order exercise treadmill stress test.  ----- Message ----- From: Gildardo CrankerWashington, Chasitie R, CMA Sent: 06/22/2017   1:08 PM To: Lars MassonKatarina H Nelson, MD, Loa SocksIvy M Haleigh Desmith, LPN Subject: Precert denied                                 This patient was denied for a stress echo but approved for a regular 2D echo. Please advise what you would like to do going forward.  Thank you, Chasitie

## 2017-06-24 NOTE — Telephone Encounter (Signed)
Notified the pt that per Dr Delton SeeNelson her echo was completely normal, normal LVEF, wall thickness, no significant valvular abnormalities.    Also informed the pt that her stress echo was denied by her insurance carrier, and Dr Delton SeeNelson reviewed this and now recommends that we do a GXT, in place of the stress echo. Went over GXT instructions with the pt.  Informed the pt that I will place the order for the GXT into the system, and [facility] a [facility] to call her back to arrange this appt.  Pt verbalized understanding and agrees with this plan.

## 2017-06-24 NOTE — Telephone Encounter (Signed)
New message     Patient calling stating the nurse called her today - returning call back - please call the work #

## 2017-06-24 NOTE — Telephone Encounter (Signed)
Left a message for the pt to call back to the office and have the operator page me.

## 2017-06-25 NOTE — Telephone Encounter (Signed)
Pts GXT is scheduled for 07/08/17 at 0900.  Pt [facility] date and [facility].

## 2017-06-25 NOTE — Telephone Encounter (Signed)
RE: GXT per Dr Delton SeeNelson  Received: Today  Message Contents  Evern BioFerguson, Sharon B  Martin, Ivy M, LPN        lvmom to call and schedule test

## 2017-07-08 ENCOUNTER — Ambulatory Visit (INDEPENDENT_AMBULATORY_CARE_PROVIDER_SITE_OTHER): Payer: Managed Care, Other (non HMO)

## 2017-07-08 DIAGNOSIS — I10 Essential (primary) hypertension: Secondary | ICD-10-CM

## 2017-07-08 DIAGNOSIS — R002 Palpitations: Secondary | ICD-10-CM

## 2017-07-08 DIAGNOSIS — R0789 Other chest pain: Secondary | ICD-10-CM

## 2017-07-08 LAB — EXERCISE TOLERANCE TEST
Estimated workload: 10.1 METS
Exercise duration (min): 9 min
Exercise duration (sec): 0 s
MPHR: 188 {beats}/min
Peak HR: 179 {beats}/min
Percent HR: 95 %
RPE: 16
Rest HR: 107 {beats}/min

## 2017-07-09 ENCOUNTER — Telehealth: Payer: Self-pay | Admitting: Cardiology

## 2017-07-09 NOTE — Telephone Encounter (Signed)
New Message ° ° °Patient is returning call in reference to stress test. Please call to discuss.  °

## 2017-07-09 NOTE — Telephone Encounter (Signed)
Notes recorded by Lars MassonNelson, Katarina H, MD on 07/09/2017 at 8:32 AM EST Blood pressure demonstrated a normal response to exercise. Normal exercise tolerance.  Normal stress test.  Notified the pt of normal GXT results per Dr Delton SeeNelson.  Pt verbalized understanding.

## 2017-07-20 ENCOUNTER — Encounter: Payer: Self-pay | Admitting: Family Medicine

## 2017-07-25 ENCOUNTER — Other Ambulatory Visit: Payer: Self-pay | Admitting: Internal Medicine

## 2017-08-03 ENCOUNTER — Telehealth: Payer: Self-pay | Admitting: Family Medicine

## 2017-08-03 NOTE — Telephone Encounter (Signed)
fmla paperwork in Dr Ermalene SearingBedsole In box

## 2017-08-06 ENCOUNTER — Other Ambulatory Visit: Payer: Self-pay | Admitting: Family Medicine

## 2017-08-06 DIAGNOSIS — Z0279 Encounter for issue of other medical certificate: Secondary | ICD-10-CM

## 2017-08-06 NOTE — Telephone Encounter (Signed)
In outbox, completed

## 2017-08-09 NOTE — Telephone Encounter (Signed)
Paperwork faxed °Copy for pt °Copy for scan °Copy for billing ° °Pt aware °

## 2017-08-27 ENCOUNTER — Ambulatory Visit: Payer: Managed Care, Other (non HMO) | Admitting: Neurology

## 2017-09-07 ENCOUNTER — Other Ambulatory Visit: Payer: Self-pay | Admitting: Family Medicine

## 2017-09-14 ENCOUNTER — Ambulatory Visit: Payer: Managed Care, Other (non HMO) | Admitting: Neurology

## 2017-09-17 ENCOUNTER — Encounter: Payer: Self-pay | Admitting: Family Medicine

## 2017-09-17 ENCOUNTER — Other Ambulatory Visit: Payer: Self-pay

## 2017-09-17 ENCOUNTER — Ambulatory Visit: Payer: Managed Care, Other (non HMO) | Admitting: Family Medicine

## 2017-09-17 DIAGNOSIS — H6993 Unspecified Eustachian tube disorder, bilateral: Secondary | ICD-10-CM | POA: Insufficient documentation

## 2017-09-17 DIAGNOSIS — R0789 Other chest pain: Secondary | ICD-10-CM | POA: Diagnosis not present

## 2017-09-17 DIAGNOSIS — J3489 Other specified disorders of nose and nasal sinuses: Secondary | ICD-10-CM | POA: Diagnosis not present

## 2017-09-17 DIAGNOSIS — G5622 Lesion of ulnar nerve, left upper limb: Secondary | ICD-10-CM

## 2017-09-17 DIAGNOSIS — H6983 Other specified disorders of Eustachian tube, bilateral: Secondary | ICD-10-CM | POA: Diagnosis not present

## 2017-09-17 MED ORDER — DICLOFENAC SODIUM 75 MG PO TBEC: 75.0000 mg | DELAYED_RELEASE_TABLET | Freq: Two times a day (BID) | ORAL | 0 refills | Status: DC

## 2017-09-17 MED ORDER — CYCLOBENZAPRINE HCL 10 MG PO TABS: 10.0000 mg | ORAL_TABLET | Freq: Every evening | ORAL | 0 refills | Status: DC | PRN

## 2017-09-17 NOTE — Assessment & Plan Note (Signed)
Treat with nasal steroid. No sign of bacterial infection. Likely due to allergies.

## 2017-09-17 NOTE — Assessment & Plan Note (Signed)
Home PT and avoid compression. Diclofenac x 2 weeks.

## 2017-09-17 NOTE — Assessment & Plan Note (Signed)
Neg cardiac work up. Likely chest wall pain.. No trigger points suggesting fibromyalgia or polymyositis.  Treat with NSAID, muscle relaxant and chest wall stretching. Follow up in 2 weeks.

## 2017-09-17 NOTE — Progress Notes (Signed)
Subjective:    Patient ID: Angela [name], female    DOB: 1985-12-09, 32 y.o.   MRN: [id number]  HPI   32 year old female presents with multiple complaints.  She will have to return to cover these completely. Most bothersome to be addressed today are:  1. Headaches with nose pain, forehead and bilateral ear pain x 2 weeks.  Occ sneezing. Mild runny nose.  no fever, mild cough, no PND no  SOB.  2. Chest pain occ radiating to anterior neck.. Feels like burning.  Epsisodes when working, more later in the dayFelt liek chest wall pain... stretches. Pain in upper central back as well. No changing in pain with head movement, eating or exertion.  Usisn ibuprofen 400 mg ever few day.Marland Kitchen Helps pain tempoarily.   Hard to get comfortable when sleeping.  Saw cardiology Dr. Delton See in  06/2017 CMET, TSH, cbc nml in 01/2017.    Stress test  07/08/2017 normal ECHO 06/23/2017 nml EF 60-65%  3. Left arm and fingers tingling in pinky side of hand.  Types on computer all day long.  no neck pain.    Mother with fibromyalgia, osteoarthritis, polymyositis.  Blood pressure 117/80, pulse 83, temperature 98.8 F (37.1 C), temperature source Oral, height 5' 3.5" (1.613 m), weight 161 lb 12 oz (73.4 kg). Social History /Family History/Past Medical History reviewed in detail and updated in EMR if needed.   Review of Systems  Constitutional: Negative for fatigue and fever.  HENT: Negative for ear pain.   Eyes: Negative for pain.  Respiratory: Negative for chest tightness and shortness of breath.   Cardiovascular: Positive for chest pain and palpitations. Negative for leg swelling.  Gastrointestinal: Negative for abdominal pain.  Genitourinary: Negative for dysuria.       Objective:   Physical Exam  Constitutional: She is oriented to person, place, and time. Vital signs are normal. She appears well-developed and well-nourished. She is cooperative.  Non-toxic appearance. She does not appear ill.  No distress.  HENT:  Head: Normocephalic.  Right Ear: Hearing, tympanic membrane, external ear and ear canal normal. Tympanic membrane is not erythematous, not retracted and not bulging.  Left Ear: Hearing, tympanic membrane, external ear and ear canal normal. Tympanic membrane is not erythematous, not retracted and not bulging.  Nose: No mucosal edema or rhinorrhea. Right sinus exhibits no maxillary sinus tenderness and no frontal sinus tenderness. Left sinus exhibits no maxillary sinus tenderness and no frontal sinus tenderness.  Mouth/Throat: Uvula is midline, oropharynx is clear and moist and mucous membranes are normal.  Eyes: Pupils are equal, round, and reactive to light. Conjunctivae, EOM and lids are normal. Lids are everted and swept, no foreign bodies found.  Neck: Trachea normal and normal range of motion. Neck supple. Carotid bruit is not present. No thyroid mass and no thyromegaly present.  Cardiovascular: Normal rate, regular rhythm, S1 normal, S2 normal, normal heart sounds, intact distal pulses and normal pulses. Exam reveals no gallop and no friction rub.  No murmur heard. Pulmonary/Chest: Effort normal and breath sounds normal. No tachypnea. No respiratory distress. She has no decreased breath sounds. She has no wheezes. She has no rhonchi. She has no rales.  Abdominal: Soft. Normal appearance and bowel sounds are normal. There is no tenderness.  Musculoskeletal:       Cervical back: Normal. She exhibits normal range of motion.  Neurological: She is alert and oriented to person, place, and time. She has normal strength and normal reflexes. No cranial  nerve deficit or sensory deficit. She exhibits normal muscle tone. She displays a negative Romberg sign. Coordination and gait normal. GCS eye subscore is 4. GCS verbal subscore is 5. GCS motor subscore is 6.  Nml cerebellar exam Neg spurling POSITIVE ulnar compression test on left at elbow.  No papilledema  Skin: Skin is warm, dry  and intact. No rash noted.  Psychiatric: She has a normal mood and affect. Her speech is normal and behavior is normal. Judgment and thought content normal. Her mood appears not anxious. Cognition and memory are normal. Cognition and memory are not impaired. She does not exhibit a depressed mood. She exhibits normal recent memory and normal remote memory.          Assessment & Plan:

## 2017-09-17 NOTE — Patient Instructions (Signed)
Flonase 2 sprays per nostril daily over the counter x 2 weeks.  Do not put pressure on left elbow at work or home.  Diclofenac 75 mg twice daily for pain and inflammation. Can use muscle relaxant at night as needed for chest wall pain.

## 2017-09-24 ENCOUNTER — Encounter: Payer: Self-pay | Admitting: Neurology

## 2017-09-24 ENCOUNTER — Ambulatory Visit: Payer: Managed Care, Other (non HMO) | Admitting: Neurology

## 2017-09-24 VITALS — BP 126/90 | HR 86 | Ht 63.0 in | Wt 163.0 lb

## 2017-09-24 DIAGNOSIS — G43709 Chronic migraine without aura, not intractable, without status migrainosus: Secondary | ICD-10-CM | POA: Diagnosis not present

## 2017-09-24 MED ORDER — AJOVY 225 MG/1.5ML ~~LOC~~ SOSY: 225.0000 mg | PREFILLED_SYRINGE | SUBCUTANEOUS | 11 refills | Status: DC

## 2017-09-24 NOTE — Progress Notes (Signed)
[facility]    Provider:  Dr Lucia Gaskins Referring Provider: Excell Seltzer, MD Primary Care Physician:  Excell Seltzer, MD CC:  Migraines  Her headaches are much improved.  2-3 migraines a month baseline was > 15. She is thrilled with Ajovy. No side effects. Will continue. Discussed other options and the other cgrp meds, she is doing so well wont change a thing. Discussed acute management as well.   Interval history: Discussion today with patient regarding Botox versus medications versus the new CGRP medications.  Patient would like to try the new CGRP medications. Provided sample today of Ajovy.  HPI:  Angela [name] is a 32 y.o. female here as a referral from Dr. Ermalene Searing for migraines. Past medical history of migraines, hypertension, anemia. Sister with migraines. Headaches worsening and started becoming very intense with nausea, vomiting, dizziness, blurry vision. Started in April with 2 weeks of hedaches. No inciting events or head trauma. There was stress at work however. Starts in the temples and behind the eys, can be unilateral and also in the back, pounding she can feel her veins and eyes about to pop out. Light and sound bothers her. She has nausea and vomiting. She has ear pain with the headaches but ear exam normal. She sees floaters sometime but no aura. She has 25 headache days a month, 15 are migrainous and can last up to 24 hours. She sometimes has morning headaches. No other focal neurologic deficits, associated symptoms, inciting events or modifiable factors. She was prescribed topiramate but never picked it up bc she read the side effects. She hs her fallopian tubes removed, no more children. No other focal neurologic deficits, associated symptoms, inciting events or modifiable factors.  Meds tried: Imitrex (made her feel weird), flexeril, Labetalol and Topiramate   Reviewed notes, labs and imaging from outside physicians, which showed:   hgba1c 5.6,  CMP nml 09/2016  Reviewed notes, patient has been to primary care multiple times and a migraines. Treated with Zofran. She reported using ibuprofen and Imitrex. Acute headaches resolved with Imitrex. She reported headaches every other day in April of this year. Feels a band around her head that is squeezing, associated with nausea, blurred vision and dizziness, photo and phonophobia associated, also noted ear pressure. Triggers include increase in stress. Exam noted that there was no papilledema. She was started on Topamax at bedtime. She did not fill this given fear of side effects. She is using Imitrex for severe headaches and using ibuprofen as well as. The month with headaches usually 1-2 hours but some up to 6-12 hours.    Review of Systems: Patient complains of symptoms per HPI as well as the following symptoms: no CP, no SOB. Pertinent negatives and positives per HPI. All others negative.   Social History   Socioeconomic History  . Marital status: Married    Spouse name: HASAN  . Number of children: 2  . Years of education: 14+  . Highest education level: Not on file  Occupational History  . Occupation: Retail banker: [employer]  Social Needs  . Financial resource strain: Not on file  . Food insecurity:    Worry: Not on file    Inability: Not on file  . Transportation needs:    Medical: Not on file    Non-medical: Not on file  Tobacco Use  . Smoking status: Never Smoker  . Smokeless tobacco: Never Used  Substance and Sexual Activity  . Alcohol use:  Yes    Alcohol/week: 1.8 oz    Types: 2 Glasses of wine, 1 Shots of liquor per week    Comment: weekends  . Drug use: No  . Sexual activity: Yes    Partners: Male    Birth control/protection: Other-see comments    Comment: Ablation  Lifestyle  . Physical activity:    Days per week: Not on file    Minutes per session: Not on file  . Stress: Not on file  Relationships  . Social connections:     Talks on phone: Not on file    Gets together: Not on file    Attends religious service: Not on file    Active member of club or organization: Not on file    Attends meetings of clubs or organizations: Not on file    Relationship status: Not on file  . Intimate partner violence:    Fear of current or ex partner: Not on file    Emotionally abused: Not on file    Physically abused: Not on file    Forced sexual activity: Not on file  Other Topics Concern  . Not on file  Social History Narrative   Lives at home w/ her husband and children   Right-handed   Caffeine: 1 cup coffee daily    Family History  Problem Relation Age of Onset  . Anesthesia problems Mother        NAUSEA AND VOMITING  . Thyroid disease Mother   . Fibromyalgia Mother   . Sarcoidosis Mother   . Depression Mother   . Other Mother        VARICOSE VEINS  . Anesthesia problems Other   . Diabetes Father   . Hypertension Sister   . Arthritis Maternal Grandmother   . Heart disease Maternal Grandmother   . Hypertension Maternal Grandmother   . Kidney disease Maternal Grandmother        FAILURE  . Cancer Maternal Grandfather        prostate  . Depression Maternal Aunt   . Sarcoidosis Maternal Aunt   . Asthma Cousin     Past Medical History:  Diagnosis Date  . Abnormal Pap smear AGE 60   COLPO LAST PAP 08/2011  . Anemia   . Complication of anesthesia    NAUSEA; VOMITING; ITCHING; DIFFICULTY BREATHING  . Genital herpes   . GERD (gastroesophageal reflux disease) AGE 32   tums prn  . History of chicken pox   . Hypertension   . Infection AGE 33   CHLAMYDIA; GONORRHEA  . 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 Infection    BV  . Migraines   . Ovarian cyst rupture   . PONV (postoperative nausea and vomiting)   . Preterm labor   . Urinary incontinence     Past Surgical History:  Procedure Laterality Date  . CERVIX LESION DESTRUCTION    . CESAREAN SECTION  2005, 2010   x2  . CESAREAN SECTION WITH BILATERAL TUBAL  LIGATION Bilateral 11/15/2012   Procedure: REPEAT CESAREAN SECTION WITH BILATERAL TUBAL LIGATION;  Surgeon: Kirkland Hun, MD;  Location: [facility];  Service: Obstetrics;  Laterality: Bilateral;  . COLPOSCOPY    . UNILATERAL SALPINGECTOMY Right 11/15/2012   Procedure: UNILATERAL SALPINGECTOMY;  Surgeon: Kirkland Hun, MD;  Location: [facility];  Service: Obstetrics;  Laterality: Right;  . WISDOM TOOTH EXTRACTION      Current Outpatient Medications  Medication Sig Dispense Refill  . AJOVY 225 MG/1.5ML SOSY INJECT  1 DOSE EVERY 30 DAYS AS DIRECTED  11  . amLODipine (NORVASC) 10 MG tablet Take 1 tablet (10 mg total) by mouth daily. 90 tablet 1  . cyclobenzaprine (FLEXERIL) 10 MG tablet Take 1 tablet (10 mg total) by mouth at bedtime as needed for muscle spasms. 15 tablet 0  . diclofenac (VOLTAREN) 75 MG EC tablet Take 1 tablet (75 mg total) by mouth 2 (two) times daily. 30 tablet 0  . ibuprofen (ADVIL,MOTRIN) 800 MG tablet TAKE 1 TABLET BY MOUTH EVERY 8 HOURS AS NEEDED 30 tablet 0  . losartan (COZAAR) 50 MG tablet TAKE 2 TABLETS BY MOUTH  DAILY 180 tablet 1  . pantoprazole (PROTONIX) 40 MG tablet Take 1 tablet (40 mg total) by mouth daily. 90 tablet 3  . tolterodine (DETROL LA) 4 MG 24 hr capsule TAKE 1 CAPSULE BY MOUTH  DAILY 90 capsule 1  . valACYclovir (VALTREX) 1000 MG tablet Take 1 tablet (1,000 mg total) by mouth 2 (two) times daily. 20 tablet 0   No current facility-administered medications for this visit.     Allergies as of 09/24/2017 - Review Complete 09/17/2017  Allergen Reaction Noted  . Amoxicillin Hives 12/01/2010    Vitals: There were no vitals taken for this visit. Last Weight:  Wt Readings from Last 1 Encounters:  09/17/17 161 lb 12 oz (73.4 kg)   Last Height:   Ht Readings from Last 1 Encounters:  09/17/17 5' 3.5" (1.613 m)   Physical exam: Exam: Gen: NAD, conversant, well nourised, obese, well groomed                     CV: RRR, no MRG. No Carotid Bruits. No  peripheral edema, warm, nontender Eyes: Conjunctivae clear without exudates or hemorrhage  Neuro: Detailed Neurologic Exam  Speech:    Speech is normal; fluent and spontaneous with normal comprehension.  Cognition:    The patient is oriented to person, place, and time;     recent and remote memory intact;     language fluent;     normal attention, concentration,     fund of knowledge Cranial Nerves:    The pupils are equal, round, and reactive to light. The fundi are normal and spontaneous venous pulsations are present. Visual fields are full to finger confrontation. Extraocular movements are intact. Trigeminal sensation is intact and the muscles of mastication are normal. The face is symmetric. The palate elevates in the midline. Hearing intact. Voice is normal. Shoulder shrug is normal. The tongue has normal motion without fasciculations.   Coordination:    Normal finger to nose and heel to shin. Normal rapid alternating movements.   Gait:    Heel-toe and tandem gait are normal.   Motor Observation:    No asymmetry, no atrophy, and no involuntary movements noted. Tone:    Normal muscle tone.    Posture:    Posture is normal. normal erect    Strength:    Strength is V/V in the upper and lower limbs.      Sensation: intact to LT     Reflex Exam:  DTR's:    Deep tendon reflexes in the upper and lower extremities are normal bilaterally.   Toes:    The toes are downgoing bilaterally.   Clonus:    Clonus is absent.       Assessment/Plan:  This is a 32 year old patient with chronic migraines without aura. We had a long discussion today about migraines including acute management  and preventative management. I recommended topiramate extended release. Also suggested nortriptyline, propranolol or gabapentin as options. Patient would like to research these options and get back to me. Discussed medication overuse and rebound headache, do not use with goody powders and all others  more than 10 times a month. Try Relpax and Cambia for acute management.   Continue Ajovy today, doing exceptionally well  Discussed: To prevent or relieve headaches, try the following: Cool Compress. Lie down and place a cool compress on your head.  Avoid headache triggers. If certain foods or odors seem to have triggered your migraines in the past, avoid them. A headache diary might help you identify triggers.  Include physical activity in your daily routine. Try a daily walk or other moderate aerobic exercise.  Manage stress. Find healthy ways to cope with the stressors, such as delegating tasks on your to-do list.  Practice relaxation techniques. Try deep breathing, yoga, massage and visualization.  Eat regularly. Eating regularly scheduled meals and maintaining a healthy diet might help prevent headaches. Also, drink plenty of fluids.  Follow a regular sleep schedule. Sleep deprivation might contribute to headaches Consider biofeedback. With this mind-body technique, you learn to control certain bodily functions - such as muscle tension, heart rate and blood pressure - to prevent headaches or reduce headache pain.    Proceed to emergency room if you experience new or worsening symptoms or symptoms do not resolve, if you have new neurologic symptoms or if headache is severe, or for any concerning symptom.   Provided education and documentation from American headache Society toolbox including articles on: chronic migraine medication overuse headache, chronic migraines, prevention of migraines, behavioral and other nonpharmacologic treatments for headache.    Naomie Dean, MD  [facility] [address] [place], [place] [address]  Phone [phone] Fax [phone]  A total of 15 minutes was spent in with this patient. Over half this time was spent on counseling patient on the migraine diagnosis and different therapeutic options available.

## 2017-10-04 ENCOUNTER — Other Ambulatory Visit: Payer: Self-pay | Admitting: Family Medicine

## 2017-10-05 NOTE — Telephone Encounter (Signed)
Last office visit 09/17/2017.  Last refilled 09/17/2017 for #15 with no refills.  Ok to refill?

## 2017-10-08 ENCOUNTER — Ambulatory Visit (INDEPENDENT_AMBULATORY_CARE_PROVIDER_SITE_OTHER)
Admission: RE | Admit: 2017-10-08 | Discharge: 2017-10-08 | Disposition: A | Discharge status: Home / Self Care | Payer: Managed Care, Other (non HMO) | Source: Ambulatory Visit | Attending: Family Medicine | Admitting: Family Medicine

## 2017-10-08 ENCOUNTER — Other Ambulatory Visit: Payer: Self-pay

## 2017-10-08 ENCOUNTER — Encounter: Payer: Self-pay | Admitting: Family Medicine

## 2017-10-08 ENCOUNTER — Ambulatory Visit: Payer: Managed Care, Other (non HMO) | Admitting: Family Medicine

## 2017-10-08 DIAGNOSIS — R0789 Other chest pain: Secondary | ICD-10-CM | POA: Diagnosis not present

## 2017-10-08 DIAGNOSIS — G5622 Lesion of ulnar nerve, left upper limb: Secondary | ICD-10-CM | POA: Diagnosis not present

## 2017-10-08 DIAGNOSIS — J3489 Other specified disorders of nose and nasal sinuses: Secondary | ICD-10-CM | POA: Diagnosis not present

## 2017-10-08 DIAGNOSIS — M549 Dorsalgia, unspecified: Secondary | ICD-10-CM | POA: Insufficient documentation

## 2017-10-08 NOTE — Assessment & Plan Note (Signed)
Ttp over central vertebrae. NO red flags, no falls/injuries.  If neg X-ray.. rec trial of muscle relaxant.

## 2017-10-08 NOTE — Assessment & Plan Note (Signed)
Give more time for resolution.

## 2017-10-08 NOTE — Progress Notes (Signed)
Subjective:    Patient ID: Angela [name], female    DOB: [birth year], 32 y.o.   MRN: [id number]  HPI   32 year old female presents for  2 week follow up multiple issues.  1. Atypical chest pain: Treated with chest wall stretches as likey MSK strain.  The pain is there but is more like a discomfort. She is bothered buy it given it worsened.   Discomfort is improved on Monday, but as stress at  Work continues it worsens by end of weak.  She works at a call center.  Job is stressful, she has a lot on her plate at work.   She wakes up  In night, cannot stop thinking about what she has to do at work.   GAD7 4  2. Sinus pressure due to allergies: treated with antihistamine, flonase.  Improved, no fever, no cough.   3. Left ulnar neuropathy: treated with compression avoidance, home PT and diclofenac.  Diclofenac helped with some left shoulder, neck pain.Marland Kitchen Resolved.  No   worsening   Followed by Dr. Lucia Gaskins for  Headaches on 5/17. On Ajovy. Relpax andCambia for acute management.   She also  Has pain in her upper mid back x 1-2 months. Moderate posture. Dull ache.  Occ waking her up at night. No change with diclofenac. She never tried the flexeril.  Blood pressure 110/60, pulse 72, temperature 98.9 F (37.2 C), temperature source Oral, height 5' 3.5" (1.613 m), weight 161 lb 8 oz (73.3 kg).   Review of Systems  Constitutional: Negative for fatigue and fever.  HENT: Negative for ear pain and postnasal drip.   Eyes: Negative for pain.  Respiratory: Negative for cough and wheezing.   Cardiovascular: Positive for chest pain. Negative for palpitations.  Gastrointestinal: Negative for constipation and diarrhea.  Genitourinary: Negative for flank pain.       Objective:   Physical Exam  Constitutional: She is oriented to person, place, and time. Vital signs are normal. She appears well-developed and well-nourished. She is cooperative.  Non-toxic appearance. She does not appear  ill. No distress.  HENT:  Head: Normocephalic.  Right Ear: Hearing, tympanic membrane, external ear and ear canal normal. Tympanic membrane is not erythematous, not retracted and not bulging.  Left Ear: Hearing, tympanic membrane, external ear and ear canal normal. Tympanic membrane is not erythematous, not retracted and not bulging.  Nose: No mucosal edema or rhinorrhea. Right sinus exhibits no maxillary sinus tenderness and no frontal sinus tenderness. Left sinus exhibits no maxillary sinus tenderness and no frontal sinus tenderness.  Mouth/Throat: Uvula is midline, oropharynx is clear and moist and mucous membranes are normal.  Eyes: Pupils are equal, round, and reactive to light. Conjunctivae, EOM and lids are normal. Lids are everted and swept, no foreign bodies found.  Neck: Trachea normal and normal range of motion. Neck supple. Carotid bruit is not present. No thyroid mass and no thyromegaly present.  Cardiovascular: Normal rate, regular rhythm, S1 normal, S2 normal, normal heart sounds, intact distal pulses and normal pulses. Exam reveals no gallop and no friction rub.  No murmur heard. Pulmonary/Chest: Effort normal and breath sounds normal. No tachypnea. No respiratory distress. She has no decreased breath sounds. She has no wheezes. She has no rhonchi. She has no rales.  Abdominal: Soft. Normal appearance and bowel sounds are normal. There is no tenderness.  Musculoskeletal:       Cervical back: Normal. She exhibits normal range of motion.  Thoracic back: She exhibits tenderness and bony tenderness. She exhibits normal range of motion and no swelling.  full ROM spine, no masses palpated  Neurological: She is alert and oriented to person, place, and time. She has normal strength and normal reflexes. No cranial nerve deficit or sensory deficit. She exhibits normal muscle tone. She displays a negative Romberg sign. Coordination and gait normal. GCS eye subscore is 4. GCS verbal  subscore is 5. GCS motor subscore is 6.  Nml cerebellar exam   No papilledema  Skin: Skin is warm, dry and intact. No rash noted.  Psychiatric: She has a normal mood and affect. Her speech is normal and behavior is normal. Judgment and thought content normal. Her mood appears not anxious. Cognition and memory are normal. Cognition and memory are not impaired. She does not exhibit a depressed mood. She exhibits normal recent memory and normal remote memory.          Assessment & Plan:

## 2017-10-08 NOTE — Patient Instructions (Addendum)
Restart flonase but as maintanance 1 spray per nostril daily.  Continue to avoid compression of ulnar nerve.  We will call with X-ray results.

## 2017-10-08 NOTE — Assessment & Plan Note (Signed)
Improved with nasal steroid, but then pt stopped.. Return to use but as maintanence until allergy season over.

## 2017-10-08 NOTE — Assessment & Plan Note (Signed)
Most clearly MSK pain. She did not use muscle relaxant but has been doing stretches. Pain clearly related to stress at work. Neg for generalized anxiety.  Possible fibromyalgia.  Discussed how some of migraine preventative she is using could help.. Such as nortriptinline or gabapentin.

## 2017-10-21 ENCOUNTER — Ambulatory Visit: Payer: Managed Care, Other (non HMO) | Admitting: Cardiology

## 2017-10-21 ENCOUNTER — Other Ambulatory Visit: Payer: Self-pay | Admitting: Family Medicine

## 2017-11-15 DIAGNOSIS — R000 Tachycardia, unspecified: Secondary | ICD-10-CM | POA: Insufficient documentation

## 2017-11-15 NOTE — Progress Notes (Addendum)
Cardiology Office Note    Date:  11/16/2017  ID:  Angela [name], DOB 1985/09/04, MRN [id number] PCP:  Excell Seltzer, MD  Cardiologist:  Tobias Alexander, MD   Chief Complaint: f/u HTN  History of Present Illness:  Angela [name] is a 32 y.o. female with history of high blood pressure, palpitations, chest discomfort, migraines, ovarian cyst, GERD who presents for follow-up of HTN.   She was seen in the ER 04/2017 for chest pain and palpitations. Workup was unremarkable, EKG NSR, K 3.4 (seems unusual for patient), Cr 1.16, Hgb 12.6. She was discharged home with recommendation to follow-up for her blood pressure. Dr. Delton See saw her in 06/2017 for evaluation of the above. 2D echo 06/23/17 was normal with normal EF 60-65%, normal diastolic parameters. ETT 07/08/17 was normal. Other labs recently - 01/2017 TSH wnl, LFTS ok, 09/2017 LDL 70. Resting HR appears to have been just over 100 on recent OV and stress test (sinus tach).  She presents back for follow-up overall doing much better. She has been able to exercise regularly without any exertional chest pain or dyspnea (goes up several flights of stairs for work, plays basketball several times a week with 3 sons). Gets rare chest wall discomfort at rest. She does still occasionally feel an increase in her HR - when she checks it, max she's seen is 112bpm. Ajovy has been a real help for her migraines. She takes NSAIDS only sparingly for various ortho pains. She does not plan on having any more children (we talked about losartan being category X).     Past Medical History:  Diagnosis Date  . Abnormal Pap smear AGE 89   COLPO LAST PAP 08/2011  . Anemia   . Complication of anesthesia    NAUSEA; VOMITING; ITCHING; DIFFICULTY BREATHING  . Genital herpes   . GERD (gastroesophageal reflux disease) AGE 9   tums prn  . History of chicken pox   . Hypertension   . Infection AGE 57   CHLAMYDIA; GONORRHEA  . 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 Infection     BV  . Migraines   . Ovarian cyst rupture   . PONV (postoperative nausea and vomiting)   . Preterm labor   . Urinary incontinence     Past Surgical History:  Procedure Laterality Date  . CERVIX LESION DESTRUCTION    . CESAREAN SECTION  2005, 2010   x2  . CESAREAN SECTION WITH BILATERAL TUBAL LIGATION Bilateral 11/15/2012   Procedure: REPEAT CESAREAN SECTION WITH BILATERAL TUBAL LIGATION;  Surgeon: Kirkland Hun, MD;  Location: [facility];  Service: Obstetrics;  Laterality: Bilateral;  . COLPOSCOPY    . UNILATERAL SALPINGECTOMY Right 11/15/2012   Procedure: UNILATERAL SALPINGECTOMY;  Surgeon: Kirkland Hun, MD;  Location: [facility];  Service: Obstetrics;  Laterality: Right;  . WISDOM TOOTH EXTRACTION      Current Medications: Current Meds  Medication Sig  . AJOVY 225 MG/1.5ML SOSY Inject 225 mg into the skin every 30 (thirty) days.  Marland Kitchen amLODipine (NORVASC) 10 MG tablet TAKE 1 TABLET BY MOUTH  DAILY  . cyclobenzaprine (FLEXERIL) 10 MG tablet TAKE 1 TABLET (10 MG TOTAL) BY MOUTH AT BEDTIME AS NEEDED FOR MUSCLE SPASMS.  Marland Kitchen diclofenac (VOLTAREN) 75 MG EC tablet Take 1 tablet (75 mg total) by mouth 2 (two) times daily.  Marland Kitchen ibuprofen (ADVIL,MOTRIN) 800 MG tablet TAKE 1 TABLET BY MOUTH EVERY 8 HOURS AS NEEDED  . losartan (COZAAR) 50 MG tablet TAKE 2 TABLETS  BY MOUTH  DAILY  . pantoprazole (PROTONIX) 40 MG tablet Take 1 tablet (40 mg total) by mouth daily.  Marland Kitchen. tolterodine (DETROL LA) 4 MG 24 hr capsule TAKE 1 CAPSULE BY MOUTH  DAILY  . valACYclovir (VALTREX) 1000 MG tablet Take 1 tablet (1,000 mg total) by mouth 2 (two) times daily.    Allergies:   Amoxicillin   Social History   Socioeconomic History  . Marital status: Married    Spouse name: HASAN  . Number of children: 2  . Years of education: 14+  . Highest education level: Not on file  Occupational History  . Occupation: Retail bankerCHEDULING COORDINATOR    Employer: [employer]  Social Needs  . Financial resource strain: Not on file  . Food  insecurity:    Worry: Not on file    Inability: Not on file  . Transportation needs:    Medical: Not on file    Non-medical: Not on file  Tobacco Use  . Smoking status: Never Smoker  . Smokeless tobacco: Never Used  Substance and Sexual Activity  . Alcohol use: Yes    Alcohol/week: 1.8 oz    Types: 2 Glasses of wine, 1 Shots of liquor per week    Comment: weekends  . Drug use: No  . Sexual activity: Yes    Partners: Male    Birth control/protection: Other-see comments    Comment: Ablation  Lifestyle  . Physical activity:    Days per week: Not on file    Minutes per session: Not on file  . Stress: Not on file  Relationships  . Social connections:    Talks on phone: Not on file    Gets together: Not on file    Attends religious service: Not on file    Active member of club or organization: Not on file    Attends meetings of clubs or organizations: Not on file    Relationship status: Not on file  Other Topics Concern  . Not on file  Social History Narrative   Lives at home w/ her husband and children   Right-handed   Caffeine: 1 cup coffee daily     Family History:  The patient's family history includes Anesthesia problems in her mother and other; Arthritis in her maternal grandmother; Asthma in her cousin; Cancer in her maternal grandfather; Depression in her maternal aunt and mother; Diabetes in her father; Fibromyalgia in her mother; Heart disease in her maternal grandmother; Hypertension in her maternal grandmother and sister; Kidney disease in her maternal grandmother; Other in her mother; Sarcoidosis in her maternal aunt and mother; Thyroid disease in her mother.  ROS:   Please see the history of present illness.  All other systems are reviewed and otherwise negative.    PHYSICAL EXAM:   VS:  BP 120/88   Pulse 95   Ht 5' 3.5" (1.613 m)   Wt 160 lb 6.4 oz (72.8 kg)   SpO2 95%   BMI 27.97 kg/m   BMI: Body mass index is 27.97 kg/m. GEN: Well nourished, well  developed AAF, in no acute distress HEENT: normocephalic, atraumatic Neck: no JVD, carotid bruits, or masses Cardiac: RRR; no murmurs, rubs, or gallops, no edema  Respiratory:  clear to auscultation bilaterally, normal work of breathing GI: soft, nontender, nondistended, + BS MS: no deformity or atrophy Skin: warm and dry, no rash Neuro:  Alert and Oriented x 3, Strength and sensation are intact, follows commands Psych: euthymic mood, full affect  Wt Readings  from Last 3 Encounters:  11/16/17 160 lb 6.4 oz (72.8 kg)  10/08/17 161 lb 8 oz (73.3 kg)  09/24/17 163 lb (73.9 kg)      Studies/Labs Reviewed:   EKG:   EKG was not ordered today.  Recent Labs: 01/29/2017: ALT 20; Platelets 321; TSH 3.570 05/02/2017: BUN 4; Creatinine, Ser 0.60; Hemoglobin 12.6; Potassium 3.4; Sodium 139   Lipid Panel    Component Value Date/Time   CHOL 137 10/03/2016 1107   TRIG 41 10/03/2016 1107   HDL 59 10/03/2016 1107   CHOLHDL 2.3 10/03/2016 1107   LDLCALC 70 10/03/2016 1107    Additional studies/ records that were reviewed today include: Summarized above.   ASSESSMENT & PLAN:   1. Essential HTN - diastolic mildly elevated but overall controlled at home. She limits NSAIDS and has been increasing her exercise lately. Will consolidate losartan to 100mg  daily tablet. Add low dose Toprol as below to blunt HR variation. 2. Atypical chest pain - likely chest wall, non-exertional, not consistent with angina. No dyspnea. ETT normal. 3. Palpitations/sinus tachycardia - anecdotally I have seen dozens of patients with migraine disorder and inappropriate sinus tachycardia. Her HR appears to vary from 80s-112 per her report. She's not had a clear cut reason for her recent tendency to have upper-normal HR at times. No other signs of acute systemic problem. Pulse ox is normal. Will add Toprol 12.5mg  daily to blunt HR response. We discussed that if her BP begins to trend downward, she can cut her amlodipine in  half. She will notify us of any change in symptoms. 4. Hypokalemia - noted 04/2017 labs. Check BMET, Mg.  Disposition: F/u with Dr. Delton See in 1 year.  Medication Adjustments/Labs and Tests Ordered: Current medicines are reviewed at length with the patient today.  Concerns regarding medicines are outlined above. Medication changes, Labs and Tests ordered today are summarized above and listed in the Patient Instructions accessible in Encounters.   Signed, Laurann Montana, PA-C  11/16/2017 8:21 AM    [facility] [address] [place], [place], [place]  [address] Phone: [phone]; Fax: [phone]

## 2017-11-16 ENCOUNTER — Ambulatory Visit: Payer: Managed Care, Other (non HMO) | Admitting: Physician Assistant

## 2017-11-16 ENCOUNTER — Encounter: Payer: Self-pay | Admitting: Physician Assistant

## 2017-11-16 VITALS — BP 120/88 | HR 95 | Ht 63.5 in | Wt 160.4 lb

## 2017-11-16 DIAGNOSIS — I10 Essential (primary) hypertension: Secondary | ICD-10-CM

## 2017-11-16 DIAGNOSIS — R002 Palpitations: Secondary | ICD-10-CM | POA: Diagnosis not present

## 2017-11-16 DIAGNOSIS — R0789 Other chest pain: Secondary | ICD-10-CM | POA: Diagnosis not present

## 2017-11-16 DIAGNOSIS — R000 Tachycardia, unspecified: Secondary | ICD-10-CM | POA: Diagnosis not present

## 2017-11-16 LAB — BASIC METABOLIC PANEL
BUN / CREAT RATIO: 13 (ref 9–23)
BUN: 11 mg/dL (ref 6–20)
CHLORIDE: 99 mmol/L (ref 96–106)
CO2: 22 mmol/L (ref 20–29)
Calcium: 9.5 mg/dL (ref 8.7–10.2)
Creatinine, Ser: 0.83 mg/dL (ref 0.57–1.00)
GFR calc non Af Amer: 93 mL/min/{1.73_m2} (ref >59)
GFR, EST AFRICAN AMERICAN: 107 mL/min/{1.73_m2} (ref >59)
Glucose: 97 mg/dL (ref 65–99)
POTASSIUM: 4.6 mmol/L (ref 3.5–5.2)
SODIUM: 138 mmol/L (ref 134–144)

## 2017-11-16 LAB — MAGNESIUM: Magnesium: 1.9 mg/dL (ref 1.6–2.3)

## 2017-11-16 MED ORDER — METOPROLOL SUCCINATE ER 25 MG PO TB24: 12.5000 mg | ORAL_TABLET | Freq: Every day | ORAL | 0 refills | Status: DC

## 2017-11-16 MED ORDER — LOSARTAN POTASSIUM 100 MG PO TABS: 100.0000 mg | ORAL_TABLET | Freq: Every day | ORAL | 3 refills | Status: DC

## 2017-11-16 MED ORDER — METOPROLOL SUCCINATE ER 25 MG PO TB24: 12.5000 mg | ORAL_TABLET | Freq: Every day | ORAL | 3 refills | Status: DC

## 2017-11-16 NOTE — Patient Instructions (Addendum)
If your blood pressure begins to run on the lower side, you can cut your amlodipine in half to 5mg  daily and call us to let us know.  Medication Instructions:  Your physician has recommended you make the following change in your medication:  1. Changed Losartan to (100 mg ) tablet daily. 2. Start Toprol XL one half tablet (12.5 mg ) daily all medication's sent to mail order and [facility].   Labwork: Your physician recommends that you have  lab work today: bmet/mag   Testing/Procedures: -None  Follow-Up: Your physician wants you to follow-up in: 1 year with Dr. Delton SeeNelson.  You will receive a reminder letter in the mail two months in advance. If you don't receive a letter, please call our office to schedule the follow-up appointment.   Any Other Special Instructions Will Be Listed Below (If Applicable).     If you need a refill on your cardiac medications before your next appointment, please call your pharmacy.

## 2017-11-17 DIAGNOSIS — Z0279 Encounter for issue of other medical certificate: Secondary | ICD-10-CM

## 2017-11-30 ENCOUNTER — Telehealth: Payer: Self-pay | Admitting: Family Medicine

## 2017-11-30 NOTE — Telephone Encounter (Signed)
Pt dropped off accommodation medical assessment form She stated same as last one  Paperwork in dr Ermalene Searingbedsole in box for review and signature

## 2017-12-03 NOTE — Telephone Encounter (Signed)
Pt called to check the status of the forms, contact pt to advise

## 2017-12-03 NOTE — Telephone Encounter (Signed)
Already completed.Marland Kitchen. Has been placed in outbox

## 2017-12-07 NOTE — Telephone Encounter (Signed)
Paperwork faxed 7/26

## 2017-12-09 ENCOUNTER — Telehealth: Payer: Self-pay | Admitting: Family Medicine

## 2017-12-09 NOTE — Telephone Encounter (Signed)
[facility] faxed paperwork needing additional information.  They are wanting to know if  Workplace accommodation would be permanent or temporary  Paperwork in dr ArvinMeritorbedsole in box

## 2017-12-13 ENCOUNTER — Other Ambulatory Visit: Payer: Self-pay | Admitting: Physician Assistant

## 2017-12-14 NOTE — Telephone Encounter (Signed)
In my outbox.

## 2017-12-15 NOTE — Telephone Encounter (Signed)
Paperwork faxed °

## 2017-12-17 NOTE — Telephone Encounter (Signed)
Copy for scan 

## 2018-01-02 ENCOUNTER — Other Ambulatory Visit: Payer: Self-pay | Admitting: Family Medicine

## 2018-01-03 ENCOUNTER — Telehealth: Payer: Self-pay | Admitting: Family Medicine

## 2018-01-03 NOTE — Telephone Encounter (Signed)
Yes, Please work her in. 

## 2018-01-03 NOTE — Telephone Encounter (Signed)
Pt is asking to be worked in 1st in am or last in pm sooner next available is in december

## 2018-01-03 NOTE — Telephone Encounter (Signed)
Left message asking pt to call office  °

## 2018-01-03 NOTE — Telephone Encounter (Signed)
See below CRM Can pt be worked in first things am or late thing pm or wait till next available appointment  Copied from CRM [phone]. Topic: Quick Communication - Office Called Patient >> Jan 03, 2018 12:11 PM Carrie MewHayes, Robin B wrote: Reason for CRM: left message asking pt to call office  Please schedule CPE with fasting labs prior for Dr. Ermalene SearingBedsole. >> Jan 03, 2018 12:52 PM Windy KalataMichael, Taylor L, NT wrote: Patient returned phone call and stated she needed a first in the morning or the latest in the afternoon appointment. The first appointment that came available was 04/12/18. She would like to know is there anyway she can be seen sooner than that, either first thing in the morning or late in the evening. Please contact.

## 2018-01-04 NOTE — Telephone Encounter (Signed)
Pt calling Robin back. She would like a call back. Work number [phone]

## 2018-01-04 NOTE — Telephone Encounter (Signed)
Labs 9/18 cpx 9/20 Pt aware

## 2018-01-10 ENCOUNTER — Other Ambulatory Visit: Payer: Self-pay | Admitting: Family Medicine

## 2018-01-22 ENCOUNTER — Telehealth: Payer: Self-pay | Admitting: Family Medicine

## 2018-01-22 DIAGNOSIS — R7303 Prediabetes: Secondary | ICD-10-CM

## 2018-01-22 DIAGNOSIS — I10 Essential (primary) hypertension: Secondary | ICD-10-CM

## 2018-01-22 NOTE — Telephone Encounter (Signed)
-----   Message from Wendi MayaLauren Greeson, RT sent at 01/17/2018  1:20 PM EDT ----- Regarding: Lab orders for Wed 01/26/18 Please enter CPE lab orders for 01/26/18. Thanks!

## 2018-01-26 ENCOUNTER — Other Ambulatory Visit (INDEPENDENT_AMBULATORY_CARE_PROVIDER_SITE_OTHER): Payer: Managed Care, Other (non HMO)

## 2018-01-26 DIAGNOSIS — R7303 Prediabetes: Secondary | ICD-10-CM | POA: Diagnosis not present

## 2018-01-26 DIAGNOSIS — I10 Essential (primary) hypertension: Secondary | ICD-10-CM

## 2018-01-26 NOTE — Addendum Note (Signed)
Addended by: Arlow Spiers J on: 01/26/2018 08:00 AM   Modules accepted: Orders  

## 2018-01-26 NOTE — Addendum Note (Signed)
Addended by: WALSH, TERRI J on: 01/26/2018 08:00 AM   Modules accepted: Orders  

## 2018-01-26 NOTE — Addendum Note (Signed)
Addended by: Alvina ChouWALSH, Keelin Sheridan J on: 01/26/2018 08:00 AM   Modules accepted: Orders

## 2018-01-27 LAB — LIPID PANEL
CHOLESTEROL TOTAL: 127 mg/dL (ref 100–199)
Chol/HDL Ratio: 2.2 ratio (ref 0.0–4.4)
HDL: 57 mg/dL (ref >39)
LDL CALC: 55 mg/dL (ref 0–99)
TRIGLYCERIDES: 76 mg/dL (ref 0–149)
VLDL Cholesterol Cal: 15 mg/dL (ref 5–40)

## 2018-01-27 LAB — COMPREHENSIVE METABOLIC PANEL
A/G RATIO: 1.7 (ref 1.2–2.2)
ALT: 25 IU/L (ref 0–32)
AST: 27 IU/L (ref 0–40)
Albumin: 4.5 g/dL (ref 3.5–5.5)
Alkaline Phosphatase: 60 IU/L (ref 39–117)
BUN/Creatinine Ratio: 12 (ref 9–23)
BUN: 10 mg/dL (ref 6–20)
Bilirubin Total: 0.2 mg/dL (ref 0.0–1.2)
CALCIUM: 9.7 mg/dL (ref 8.7–10.2)
CO2: 24 mmol/L (ref 20–29)
CREATININE: 0.81 mg/dL (ref 0.57–1.00)
Chloride: 99 mmol/L (ref 96–106)
GFR, EST AFRICAN AMERICAN: 110 mL/min/{1.73_m2} (ref >59)
GFR, EST NON AFRICAN AMERICAN: 96 mL/min/{1.73_m2} (ref >59)
GLUCOSE: 100 mg/dL — AB (ref 65–99)
Globulin, Total: 2.6 g/dL (ref 1.5–4.5)
Potassium: 4.4 mmol/L (ref 3.5–5.2)
Sodium: 139 mmol/L (ref 134–144)
TOTAL PROTEIN: 7.1 g/dL (ref 6.0–8.5)

## 2018-01-27 LAB — HEMOGLOBIN A1C
Est. average glucose Bld gHb Est-mCnc: 111 mg/dL
Hgb A1c MFr Bld: 5.5 % (ref 4.8–5.6)

## 2018-01-28 ENCOUNTER — Ambulatory Visit (INDEPENDENT_AMBULATORY_CARE_PROVIDER_SITE_OTHER): Payer: Managed Care, Other (non HMO) | Admitting: Family Medicine

## 2018-01-28 ENCOUNTER — Encounter: Payer: Self-pay | Admitting: Family Medicine

## 2018-01-28 VITALS — BP 110/70 | HR 90 | Temp 98.7°F | Ht 63.5 in | Wt 164.2 lb

## 2018-01-28 DIAGNOSIS — Z23 Encounter for immunization: Secondary | ICD-10-CM

## 2018-01-28 DIAGNOSIS — I10 Essential (primary) hypertension: Secondary | ICD-10-CM

## 2018-01-28 DIAGNOSIS — Z0000 Encounter for general adult medical examination without abnormal findings: Secondary | ICD-10-CM

## 2018-01-28 DIAGNOSIS — R7303 Prediabetes: Secondary | ICD-10-CM | POA: Diagnosis not present

## 2018-01-28 NOTE — Assessment & Plan Note (Signed)
Resolved with weight loss.   

## 2018-01-28 NOTE — Progress Notes (Signed)
Subjective:    Patient ID: [name], female    DOB: Mar 15, 1986, 32 y.o.   MRN: [id number]  HPI The patient is here for annual wellness exam and preventative care.    Hypertension:   Good control on losartan, amlodipine, metoprolol   No recent  palpitations  Using medication without problems or lightheadedness: none Chest pain with exertion:none Edema:none Short of breath:none Average home BPs: Other issues:  Prediabetes .Marland Kitchen Resolved. Lab Results  Component Value Date   HGBA1C 5.5 01/26/2018  Reviewed labs in detail.  Lab Results  Component Value Date   CHOL 127 01/26/2018   HDL 57 01/26/2018   LDLCALC 55 01/26/2018   TRIG 76 01/26/2018   CHOLHDL 2.2 01/26/2018   Wt Readings from Last 3 Encounters:  01/28/18 164 lb 4 oz (74.5 kg)  11/16/17 160 lb 6.4 oz (72.8 kg)  10/08/17 161 lb 8 oz (73.3 kg)  Body mass index is 28.64 kg/m.  Exercise: with kids.. Games etc. Diet: good  Social History /Family History/Past Medical History reviewed in detail and updated in EMR if needed. Blood pressure 110/70, pulse 90, temperature 98.7 F (37.1 C), temperature source Oral, height 5' 3.5" (1.613 m), weight 164 lb 4 oz (74.5 kg).  Review of Systems  Constitutional: Negative for fatigue and fever.  HENT: Negative for congestion.   Eyes: Negative for pain.  Respiratory: Negative for cough and shortness of breath.   Cardiovascular: Negative for chest pain, palpitations and leg swelling.  Gastrointestinal: Negative for abdominal pain.  Genitourinary: Negative for dysuria and vaginal bleeding.  Musculoskeletal: Negative for back pain.  Neurological: Negative for syncope, light-headedness and headaches.  Psychiatric/Behavioral: Negative for dysphoric mood.       Objective:   Physical Exam  Constitutional: Vital signs are normal. She appears well-developed and well-nourished. She is cooperative.  Non-toxic appearance. She does not appear ill. No distress.  HENT:  Head:  Normocephalic.  Right Ear: Hearing, tympanic membrane, external ear and ear canal normal.  Left Ear: Hearing, tympanic membrane, external ear and ear canal normal.  Nose: Nose normal.  Eyes: Pupils are equal, round, and reactive to light. Conjunctivae, EOM and lids are normal. Lids are everted and swept, no foreign bodies found.  Neck: Trachea normal and normal range of motion. Neck supple. Carotid bruit is not present. No thyroid mass and no thyromegaly present.  Cardiovascular: Normal rate, regular rhythm, S1 normal, S2 normal, normal heart sounds and intact distal pulses. Exam reveals no gallop.  No murmur heard. Pulmonary/Chest: Effort normal and breath sounds normal. No respiratory distress. She has no wheezes. She has no rhonchi. She has no rales.  Abdominal: Soft. Normal appearance and bowel sounds are normal. She exhibits no distension, no fluid wave, no abdominal bruit and no mass. There is no hepatosplenomegaly. There is no tenderness. There is no rebound, no guarding and no CVA tenderness. No hernia.  Lymphadenopathy:    She has no cervical adenopathy.    She has no axillary adenopathy.  Neurological: She is alert. She has normal strength. No cranial nerve deficit or sensory deficit.  Skin: Skin is warm, dry and intact. No rash noted.  Psychiatric: Her speech is normal and behavior is normal. Judgment normal. Her mood appears not anxious. Cognition and memory are normal. She does not exhibit a depressed mood.          Assessment & Plan:  The patient's preventative maintenance and recommended screening tests for an annual wellness exam were reviewed in  full today. Brought up to date unless services declined.  Counselled on the importance of diet, exercise, and its role in overall health and mortality. The patient's FH and SH was reviewed, including their home life, tobacco status, and drug and alcohol status.   Vaccines: Uptodate with flu and Tdap Pelvic: DVE/pap; last pap  2017 nml, no high risk HPV, repeat in 5 years. No early family history of colon, uterine , ovarian or breast cancer. Nonsmoker HIV/STD: refused.

## 2018-01-28 NOTE — Assessment & Plan Note (Signed)
Well controlled. Continue current medication.  

## 2018-01-28 NOTE — Patient Instructions (Signed)
Work on weight loss, healthy eating and regular exercise.

## 2018-02-07 DIAGNOSIS — Z0279 Encounter for issue of other medical certificate: Secondary | ICD-10-CM

## 2018-02-09 ENCOUNTER — Telehealth: Payer: Self-pay | Admitting: Family Medicine

## 2018-02-09 NOTE — Telephone Encounter (Signed)
fmla paperwork in dr bedsole's in box  °For review and signature °

## 2018-02-15 NOTE — Telephone Encounter (Signed)
Patient is calling in for an update on fmla paperwork

## 2018-02-15 NOTE — Telephone Encounter (Signed)
Left message letting pt know her paperwork has been faxed Paperwork faxed Copy for pt  Copy for scan

## 2018-03-07 ENCOUNTER — Other Ambulatory Visit: Payer: Self-pay | Admitting: Family Medicine

## 2018-03-11 ENCOUNTER — Other Ambulatory Visit: Payer: Self-pay | Admitting: Family Medicine

## 2018-03-28 ENCOUNTER — Other Ambulatory Visit: Payer: Managed Care, Other (non HMO)

## 2018-03-28 ENCOUNTER — Other Ambulatory Visit: Payer: Self-pay | Admitting: Internal Medicine

## 2018-04-01 ENCOUNTER — Ambulatory Visit: Payer: Managed Care, Other (non HMO) | Admitting: Family Medicine

## 2018-04-01 ENCOUNTER — Encounter: Payer: Self-pay | Admitting: Family Medicine

## 2018-04-01 VITALS — BP 122/70 | HR 88 | Temp 99.2°F | Ht 63.5 in | Wt 166.0 lb

## 2018-04-01 DIAGNOSIS — K219 Gastro-esophageal reflux disease without esophagitis: Secondary | ICD-10-CM | POA: Diagnosis not present

## 2018-04-01 DIAGNOSIS — R1310 Dysphagia, unspecified: Secondary | ICD-10-CM

## 2018-04-01 MED ORDER — DEXLANSOPRAZOLE 60 MG PO CPDR: 60.0000 mg | DELAYED_RELEASE_CAPSULE | Freq: Every day | ORAL | 3 refills | Status: DC

## 2018-04-01 NOTE — Progress Notes (Signed)
Subjective:    Patient ID: Angela [name], female    DOB: 02/28/1986, 32 y.o.   MRN: [id number]  HPI   32 year old female presents for GERD .Marland Kitchen. Burning in chest and pain after swallowing.  Worse in last 1-2 weeks. Food sits in chest for few minutes, burning tin chest with swallowing each time eating.  She has tried protonix 40 mg daily x  > 1 year  Avoiding triggers.  She is occ ibuprofen for headache,  Was taking diclofenac prn back pain a while ago.   Occ using tums , never tried [employer] Corporation[employer]epcid Ac or zantac.  Review of Systems  Constitutional: Negative for fatigue and fever.  HENT: Negative for congestion.   Eyes: Negative for pain.  Respiratory: Negative for cough and shortness of breath.   Cardiovascular: Positive for chest pain. Negative for palpitations and leg swelling.  Gastrointestinal: Negative for abdominal pain.  Genitourinary: Negative for dysuria and vaginal bleeding.  Musculoskeletal: Negative for back pain.  Neurological: Negative for syncope, light-headedness and headaches.  Psychiatric/Behavioral: Negative for dysphoric mood.       Objective:   Physical Exam Constitutional:      General: She is not in acute distress.    Appearance: Normal appearance. She is well-developed. She is not ill-appearing or toxic-appearing.  HENT:     Head: Normocephalic.     Right Ear: Hearing, tympanic membrane, ear canal and external ear normal. Tympanic membrane is not erythematous, retracted or bulging.     Left Ear: Hearing, tympanic membrane, ear canal and external ear normal. Tympanic membrane is not erythematous, retracted or bulging.     Nose: No mucosal edema or rhinorrhea.     Right Sinus: No maxillary sinus tenderness or frontal sinus tenderness.     Left Sinus: No maxillary sinus tenderness or frontal sinus tenderness.     Mouth/Throat:     Pharynx: Uvula midline.  Eyes:     General: Lids are normal. Lids are everted, no foreign bodies appreciated.   Conjunctiva/sclera: Conjunctivae normal.     Pupils: Pupils are equal, round, and reactive to light.  Neck:     Musculoskeletal: Normal range of motion and neck supple.     Thyroid: No thyroid mass or thyromegaly.     Vascular: No carotid bruit.     Trachea: Trachea normal.  Cardiovascular:     Rate and Rhythm: Normal rate and regular rhythm.     Pulses: Normal pulses.     Heart sounds: Normal heart sounds, S1 normal and S2 normal. No murmur. No friction rub. No gallop.   Pulmonary:     Effort: Pulmonary effort is normal. No tachypnea or respiratory distress.     Breath sounds: Normal breath sounds. No decreased breath sounds, wheezing, rhonchi or rales.  Chest:     Comments:  Chronically ttp in left upper chest wall Abdominal:     General: Bowel sounds are normal.     Palpations: Abdomen is soft.     Tenderness: There is no abdominal tenderness.  Skin:    General: Skin is warm and dry.     Findings: No rash.  Neurological:     Mental Status: She is alert.  Psychiatric:        Mood and Affect: Mood is not anxious or depressed.        Speech: Speech normal.        Behavior: Behavior normal. Behavior is cooperative.        Thought Content:  Thought content normal.        Judgment: Judgment normal.           Assessment & Plan:

## 2018-04-01 NOTE — Patient Instructions (Addendum)
Stop any ibuprofen aleve, diclofenac etc. Can use tylenol for headache other pain. Please stop at the front desk to set up referral.  Avoid triggers.  Stop pantoprazole... change to dexilant.

## 2018-04-06 ENCOUNTER — Other Ambulatory Visit: Payer: Managed Care, Other (non HMO)

## 2018-04-12 ENCOUNTER — Encounter: Payer: Self-pay | Admitting: Gastroenterology

## 2018-04-12 ENCOUNTER — Other Ambulatory Visit (INDEPENDENT_AMBULATORY_CARE_PROVIDER_SITE_OTHER): Payer: Managed Care, Other (non HMO)

## 2018-04-12 ENCOUNTER — Ambulatory Visit: Payer: Managed Care, Other (non HMO) | Admitting: Gastroenterology

## 2018-04-12 ENCOUNTER — Encounter: Payer: Managed Care, Other (non HMO) | Admitting: Family Medicine

## 2018-04-12 VITALS — BP 120/74 | HR 100 | Ht 63.5 in | Wt 163.4 lb

## 2018-04-12 DIAGNOSIS — K219 Gastro-esophageal reflux disease without esophagitis: Secondary | ICD-10-CM | POA: Diagnosis not present

## 2018-04-12 DIAGNOSIS — R1319 Other dysphagia: Secondary | ICD-10-CM

## 2018-04-12 DIAGNOSIS — R1310 Dysphagia, unspecified: Secondary | ICD-10-CM

## 2018-04-12 DIAGNOSIS — R12 Heartburn: Secondary | ICD-10-CM

## 2018-04-12 LAB — CBC
HCT: 38.7 % (ref 36.0–46.0)
Hemoglobin: 13.2 g/dL (ref 12.0–15.0)
MCHC: 34.1 g/dL (ref 30.0–36.0)
MCV: 89 fl (ref 78.0–100.0)
Platelets: 348 10*3/uL (ref 150.0–400.0)
RBC: 4.35 Mil/uL (ref 3.87–5.11)
RDW: 13.6 % (ref 11.5–15.5)
WBC: 5.7 10*3/uL (ref 4.0–10.5)

## 2018-04-12 LAB — PROTIME-INR
INR: 1.2 ratio — AB (ref 0.8–1.0)
Prothrombin Time: 13.5 s — ABNORMAL HIGH (ref 9.6–13.1)

## 2018-04-12 MED ORDER — PANTOPRAZOLE SODIUM 40 MG PO TBEC: 40.0000 mg | DELAYED_RELEASE_TABLET | Freq: Two times a day (BID) | ORAL | 3 refills | Status: DC

## 2018-04-12 NOTE — Patient Instructions (Signed)
Your provider has requested that you go to the basement level for lab work before leaving today. Press "B" on the elevator. The lab is located at the first door on the left as you exit the elevator.  You have been scheduled for an endoscopy. Please follow written instructions given to you at your visit today. If you use inhalers (even only as needed), please bring them with you on the day of your procedure. Your physician has requested that you go to www.startemmi.com and enter the access code given to you at your visit today. This web site gives a general overview about your procedure. However, you should still follow specific instructions given to you by our office regarding your preparation for the procedure.  Increase Protonix to 40 mg twice a day.

## 2018-04-12 NOTE — Progress Notes (Addendum)
GASTROENTEROLOGY OUTPATIENT CLINIC VISIT   Primary Care Provider Excell SeltzerBedsole, Amy E, MD [address] [place] [address] [phone]  Referring Provider Excell SeltzerBedsole, Amy E, MD [address] [place], [place] [address] [phone]  Patient Profile: Bryan LemmaVaniqua S Cronic is a 32 y.o. female with a pmh significant for GERD, hypertension, migraines, urinary incontinence.  The patient presents to the [facility] for an evaluation and management of problem(s) noted below:  Problem List 1. Gastroesophageal reflux disease, esophagitis presence not specified   2. Pyrosis   3. Esophageal dysphagia     History of Present Illness: This is patient's first visit to the GI of our clinic.  She has had a longstanding history of reported heartburn.  She has a sensation of reflux and burning in the mid chest.  She feels at times some solid food dysphagia and this is been worse over the course the last few weeks.  She is never had to regurgitate food in the past.  She noted that fried foods/greasy foods/spicy foods can cause her to have more significant symptoms of heartburn at times but they do not necessarily make it more difficult for food to pass.  Within the last year she lost 10 pounds but she is done that intentionally with a diet.  In the last 6 months she has had no change in her weight.  She has used Tums on top of Protonix daily.  She has been on Protonix daily for over a year.  She had trialed Dexilant after talking with her PCP with her symptoms but she developed nausea and dizziness and so she stopped this.  She feels a sensation of heartburn is worse in the afternoon and evening.  She has never had an upper endoscopy or colonoscopy.  He does not take significant nonsteroidals or BC/Goody powders  GI Review of Systems Positive as above Negative for odynophagia, jaundice, nausea, vomiting, change in appetite, early satiety, melena, hematochezia  Review of  Systems General: Denies fevers/chills HEENT: Denies oral lesions Cardiovascular: Will occasionally have a left upper chest discomfort that is not associated with her heartburn Pulmonary: Denies shortness of breath/cough Gastroenterological: See HPI Genitourinary: Denies darkened urine Hematological: Denies easy bruising/bleeding Dermatological: Denies jaundice Psychological: Mood is stable Musculoskeletal: Denies new arthralgias   Medications Current Outpatient Medications  Medication Sig Dispense Refill  . AJOVY 225 MG/1.5ML SOSY Inject 225 mg into the skin every 30 (thirty) days. 1 Syringe 11  . amLODipine (NORVASC) 10 MG tablet TAKE 1 TABLET BY MOUTH  DAILY (Patient taking differently: Take 10 mg by mouth daily. ) 90 tablet 1  . cyclobenzaprine (FLEXERIL) 10 MG tablet TAKE 1 TABLET (10 MG TOTAL) BY MOUTH AT BEDTIME AS NEEDED FOR MUSCLE SPASMS. 15 tablet 0  . losartan (COZAAR) 100 MG tablet Take 1 tablet (100 mg total) by mouth daily. 90 tablet 3  . metoprolol succinate (TOPROL-XL) 25 MG 24 hr tablet Take 0.5 tablets (12.5 mg total) by mouth daily. 45 tablet 3  . tolterodine (DETROL LA) 4 MG 24 hr capsule TAKE 1 CAPSULE BY MOUTH  DAILY 90 capsule 3  . valACYclovir (VALTREX) 1000 MG tablet Take 1 tablet (1,000 mg total) by mouth 2 (two) times daily. 20 tablet 0  . pantoprazole (PROTONIX) 40 MG tablet Take 1 tablet (40 mg total) by mouth 2 (two) times daily before a meal. 60 tablet 3  . potassium chloride (K-DUR) 10 MEQ tablet Take 1 tablet (10 mEq total) by mouth daily. 4  tablet 0   No current facility-administered medications for this visit.     Allergies Allergies  Allergen Reactions  . Amoxicillin Hives    Has patient had a PCN reaction causing immediate rash, facial/tongue/throat swelling, SOB or lightheadedness with hypotension: No Has patient had a PCN reaction causing severe rash involving mucus membranes or skin necrosis: Yes Has patient had a PCN reaction that required  hospitalization No Has patient had a PCN reaction occurring within the last 10 years: No If all of the above answers are "NO", then may proceed with Cephalosporin use.   Marland Kitchen Dexilant [Dexlansoprazole] Nausea Only and Other (See Comments)    dizziness    Histories Past Medical History:  Diagnosis Date  . Abnormal Pap smear AGE 65   COLPO LAST PAP 08/2011  . Anemia   . Complication of anesthesia    NAUSEA; VOMITING; ITCHING; DIFFICULTY BREATHING  . Genital herpes   . GERD (gastroesophageal reflux disease) AGE 25   tums prn  . History of chicken pox   . Hypertension   . Infection AGE 42   CHLAMYDIA; GONORRHEA  . 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 Infection    BV  . Migraines   . Ovarian cyst rupture   . PONV (postoperative nausea and vomiting)   . Preterm labor   . Urinary incontinence    Past Surgical History:  Procedure Laterality Date  . CERVIX LESION DESTRUCTION    . CESAREAN SECTION  2005, 2010   x2  . CESAREAN SECTION WITH BILATERAL TUBAL LIGATION Bilateral 11/15/2012   Procedure: REPEAT CESAREAN SECTION WITH BILATERAL TUBAL LIGATION;  Surgeon: Kirkland Hun, MD;  Location: [facility];  Service: Obstetrics;  Laterality: Bilateral;  . COLPOSCOPY    . UNILATERAL SALPINGECTOMY Right 11/15/2012   Procedure: UNILATERAL SALPINGECTOMY;  Surgeon: Kirkland Hun, MD;  Location: [facility];  Service: Obstetrics;  Laterality: Right;  . WISDOM TOOTH EXTRACTION     Social History   Socioeconomic History  . Marital status: Married    Spouse name: HASAN  . Number of children: 2  . Years of education: 14+  . Highest education level: Not on file  Occupational History  . Occupation: Retail banker: [employer]  Social Needs  . Financial resource strain: Not on file  . Food insecurity:    Worry: Not on file    Inability: Not on file  . Transportation needs:    Medical: Not on file    Non-medical: Not on file  Tobacco Use  . Smoking status: Never Smoker  . Smokeless  tobacco: Never Used  Substance and Sexual Activity  . Alcohol use: Yes    Alcohol/week: 3.0 standard drinks    Types: 2 Glasses of wine, 1 Shots of liquor per week    Comment: weekends  . Drug use: No  . Sexual activity: Yes    Partners: Male    Birth control/protection: Other-see comments    Comment: Ablation  Lifestyle  . Physical activity:    Days per week: Not on file    Minutes per session: Not on file  . Stress: Not on file  Relationships  . Social connections:    Talks on phone: Not on file    Gets together: Not on file    Attends religious service: Not on file    Active member of club or organization: Not on file    Attends meetings of clubs or organizations: Not on file  Relationship status: Not on file  . Intimate partner violence:    Fear of current or ex partner: Not on file    Emotionally abused: Not on file    Physically abused: Not on file    Forced sexual activity: Not on file  Other Topics Concern  . Not on file  Social History Narrative   Lives at home w/ her husband and children   Right-handed   Caffeine: 1 cup coffee daily   Family History  Problem Relation Age of Onset  . Anesthesia problems Mother        NAUSEA AND VOMITING  . Thyroid disease Mother   . Fibromyalgia Mother   . Sarcoidosis Mother   . Depression Mother   . Other Mother        VARICOSE VEINS  . Anesthesia problems Other   . Diabetes Father   . Hypertension Sister   . Arthritis Maternal Grandmother   . Heart disease Maternal Grandmother   . Hypertension Maternal Grandmother   . Kidney disease Maternal Grandmother        FAILURE  . Prostate cancer Maternal Grandfather   . Depression Maternal Aunt   . Sarcoidosis Maternal Aunt   . Asthma Cousin   . Liver disease Cousin        requiring liver transplant- unknown as to what type of liver disease  . Colon cancer Neg Hx   . Esophageal cancer Neg Hx   . Pancreatic cancer Neg Hx   . Stomach cancer Neg Hx   . Inflammatory  bowel disease Neg Hx   . Rectal cancer Neg Hx    I have reviewed her medical, social, and family history in detail and updated the electronic medical record as necessary.    PHYSICAL EXAMINATION  BP 120/74   Pulse 100   Ht 5' 3.5" (1.613 m)   Wt 163 lb 6.4 oz (74.1 kg)   BMI 28.49 kg/m  Wt Readings from Last 3 Encounters:  04/12/18 163 lb 6.4 oz (74.1 kg)  04/01/18 166 lb (75.3 kg)  01/28/18 164 lb 4 oz (74.5 kg)  GEN: NAD, appears stated age, doesn't appear chronically ill PSYCH: Cooperative, without pressured speech EYE: Conjunctivae pink, sclerae anicteric ENT: MMM, without oral ulcers, no erythema or exudates noted NECK: Supple CV: RR without R/Gs  RESP: CTAB posteriorly, without wheezing GI: NABS, soft, NT/ND, without rebound or guarding, no HSM appreciated MSK/EXT: No lower extremity edema SKIN: No jaundice NEURO:  Alert & Oriented x 3, no focal deficits   REVIEW OF DATA  I reviewed the following data at the time of this encounter:  GI Procedures and Studies  No relevant studies to review  Laboratory Studies  Reviewed in epic  Imaging Studies  No relevant studies to review   ASSESSMENT  Ms. Carr is a 32 y.o. female with a pmh significant for GERD, hypertension, migraines, urinary incontinence.    The patient is seen today for evaluation and management of:  1. Gastroesophageal reflux disease, esophagitis presence not specified   2. Pyrosis   3. Esophageal dysphagia    This is a hemodynamically stable patient has had longstanding issues of heartburn for years.  We went over lifestyle modifications for the patient to follow-up.  I am increasing her PPI to twice daily.  We will plan to schedule an endoscopy as well because I think her symptoms are progressive and with the dysphagia symptoms even though she is not been maximized on PPI therapy as of  yet I suspect we will need to evaluate this further.  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  At time of endoscopy we will  plan to obtain esophageal biopsies.  She also infrequently has indigestion so we will plan to obtain gastric biopsies as well.  If the work-up above does not show evidence of significant esophagitis then she will require pH impedance testing as well as manometry.  The risks and benefits of endoscopic evaluation were discussed with the patient; these include but are not limited to the risk of perforation, infection, bleeding, missed lesions, lack of diagnosis, severe illness requiring hospitalization, as well as anesthesia and sedation related illnesses.  The patient is agreeable to proceed.    PLAN  Laboratory work-up as below Increase PPI to 40 mg twice daily Plan for endoscopy with esophageal and gastric biopsies to be obtained We will hold on obtaining pH impedance or manometry testing as of yet. Lifestyle modifications discussed -Eat meals >3 hours before bedtime -Do not lay down or lie flat immediately after eating for at least 2-hours -Do not overeat (decrease portion size and/or eat more frequent smaller meals) -Eat slowly -Wear Loose-fitting clothes -Raise Head of Bed (Head & Chest > Feet with bed blocks) -Avoid foods that trigger the symptoms (Onions, Chocolate, Caffeine, Spicy, and Fatty) -Work on Losing [employer] -Stop Tobacco Use -Avoid Alcohol -Increase Exercise Activity -Maintain Heartburn Diary/Log  Orders Placed This Encounter  Procedures  . CBC  . INR/PT  . Ambulatory referral to Gastroenterology    New Prescriptions   PANTOPRAZOLE (PROTONIX) 40 MG TABLET    Take 1 tablet (40 mg total) by mouth 2 (two) times daily before a meal.   POTASSIUM CHLORIDE (K-DUR) 10 MEQ TABLET    Take 1 tablet (10 mEq total) by mouth daily.   Modified Medications   No medications on file    Planned Follow Up: No follow-ups on file.   Corliss Parish, MD [facility]oscopy Office # [phone]

## 2018-04-14 ENCOUNTER — Encounter (HOSPITAL_COMMUNITY): Payer: Self-pay | Admitting: Emergency Medicine

## 2018-04-14 ENCOUNTER — Emergency Department (HOSPITAL_COMMUNITY)
Admission: EM | Admit: 2018-04-14 | Discharge: 2018-04-14 | Disposition: A | Discharge status: Home / Self Care | Payer: Managed Care, Other (non HMO) | Attending: Emergency Medicine | Admitting: Emergency Medicine

## 2018-04-14 ENCOUNTER — Emergency Department (HOSPITAL_COMMUNITY): Payer: Managed Care, Other (non HMO)

## 2018-04-14 ENCOUNTER — Telehealth: Payer: Self-pay | Admitting: *Deleted

## 2018-04-14 DIAGNOSIS — Z79899 Other long term (current) drug therapy: Secondary | ICD-10-CM | POA: Diagnosis not present

## 2018-04-14 DIAGNOSIS — E876 Hypokalemia: Secondary | ICD-10-CM

## 2018-04-14 DIAGNOSIS — R42 Dizziness and giddiness: Secondary | ICD-10-CM

## 2018-04-14 DIAGNOSIS — I10 Essential (primary) hypertension: Secondary | ICD-10-CM | POA: Diagnosis not present

## 2018-04-14 DIAGNOSIS — E860 Dehydration: Secondary | ICD-10-CM | POA: Diagnosis not present

## 2018-04-14 DIAGNOSIS — R0789 Other chest pain: Secondary | ICD-10-CM

## 2018-04-14 LAB — URINALYSIS, ROUTINE W REFLEX MICROSCOPIC
Bilirubin Urine: NEGATIVE
Glucose, UA: NEGATIVE mg/dL
Ketones, ur: NEGATIVE mg/dL
Leukocytes, UA: NEGATIVE
Nitrite: NEGATIVE
PROTEIN: NEGATIVE mg/dL
Specific Gravity, Urine: 1.005 (ref 1.005–1.030)
pH: 6 (ref 5.0–8.0)

## 2018-04-14 LAB — CBC
HCT: 39.2 % (ref 36.0–46.0)
Hemoglobin: 12.6 g/dL (ref 12.0–15.0)
MCH: 29 pg (ref 26.0–34.0)
MCHC: 32.1 g/dL (ref 30.0–36.0)
MCV: 90.3 fL (ref 80.0–100.0)
Platelets: 346 K/uL (ref 150–400)
RBC: 4.34 MIL/uL (ref 3.87–5.11)
RDW: 12.9 % (ref 11.5–15.5)
WBC: 8.5 K/uL (ref 4.0–10.5)
nRBC: 0 % (ref 0.0–0.2)

## 2018-04-14 LAB — BASIC METABOLIC PANEL
Anion gap: 14 (ref 5–15)
BUN: 7 mg/dL (ref 6–20)
CO2: 22 mmol/L (ref 22–32)
Calcium: 9.3 mg/dL (ref 8.9–10.3)
Chloride: 101 mmol/L (ref 98–111)
Creatinine, Ser: 0.79 mg/dL (ref 0.44–1.00)
GFR calc Af Amer: >60 mL/min (ref >60)
GFR calc non Af Amer: >60 mL/min (ref >60)
Glucose, Bld: 171 mg/dL — ABNORMAL HIGH (ref 70–99)
Potassium: 2.8 mmol/L — ABNORMAL LOW (ref 3.5–5.1)
SODIUM: 137 mmol/L (ref 135–145)

## 2018-04-14 LAB — I-STAT TROPONIN, ED: TROPONIN I, POC: 0 ng/mL (ref 0.00–0.08)

## 2018-04-14 LAB — D-DIMER, QUANTITATIVE: D-Dimer, Quant: 0.39 ug/mL-FEU (ref 0.00–0.50)

## 2018-04-14 LAB — I-STAT BETA HCG BLOOD, ED (MC, WL, AP ONLY): I-stat hCG, quantitative: <5 m[IU]/mL (ref <5)

## 2018-04-14 MED ORDER — POTASSIUM CHLORIDE CRYS ER 20 MEQ PO TBCR
40.0000 meq | EXTENDED_RELEASE_TABLET | Freq: Once | ORAL | Status: AC
  Administered 2018-04-14: 40 meq via ORAL
  Filled 2018-04-14: qty 2

## 2018-04-14 MED ORDER — POTASSIUM CHLORIDE CRYS ER 20 MEQ PO TBCR
40.0000 meq | EXTENDED_RELEASE_TABLET | ORAL | Status: DC
  Administered 2018-04-14: 40 meq via ORAL
  Filled 2018-04-14: qty 2

## 2018-04-14 MED ORDER — POTASSIUM CHLORIDE ER 10 MEQ PO TBCR: 10.0000 meq | EXTENDED_RELEASE_TABLET | Freq: Every day | ORAL | 0 refills | Status: DC

## 2018-04-14 MED ORDER — SODIUM CHLORIDE 0.9 % IV BOLUS
1500.0000 mL | Freq: Once | INTRAVENOUS | Status: AC
  Administered 2018-04-14: 1500 mL via INTRAVENOUS

## 2018-04-14 NOTE — Telephone Encounter (Signed)
Spoke to pt who c/o dizziness x1wk and chest wall pain, which she states is ongoing, and Dr Ermalene SearingBedsole is familiar with. Pt denies any vision changes or inability to speak or walk, but reports that it makes her nervous when driving. Pt denies ShOB, but based on combined pt was advised to go to the ED. She is currently returning from her lunch break, and states she will notify her supervisor and [facility] or [facility]; unsure at this moment which she will go to.

## 2018-04-14 NOTE — ED Provider Notes (Signed)
[facility] EMERGENCY DEPARTMENT Provider Note   CSN: [id number] Arrival date & time: 04/14/18  1419     History   Chief Complaint Chief Complaint  Patient presents with  . Dizziness    HPI Angela [name] is a 32 y.o. female with PMH HTN, GERD, migraines, tachycardia presenting today with chest pain and dizziness for the past week. This has been ongoing for several months but suddenly became worse. Recently prescribed metoprolol several months ago due to tachycardia. Recent ECHO and stress test in February within normal limits. Recently started on Dexilant for her GERD not improving, which seemed to onset dizziness. Stopped medication without resolution. Endorses cough, non-radiating chest pain, sensation of palpitations, states pain is reproducible with palpation. States migraines have been increasing in frequency and [place]. Denies nausea, SOB, changes in urination, BM, melena, weight loss. States she took her medications today.   The history is provided by the patient.    Past Medical History:  Diagnosis Date  . Abnormal Pap smear AGE 65   COLPO LAST PAP 08/2011  . Anemia   . Complication of anesthesia    NAUSEA; VOMITING; ITCHING; DIFFICULTY BREATHING  . Genital herpes   . GERD (gastroesophageal reflux disease) AGE 68   tums prn  . History of chicken pox   . Hypertension   . Infection AGE 28   CHLAMYDIA; GONORRHEA  . 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 Infection    BV  . Migraines   . Ovarian cyst rupture   . PONV (postoperative nausea and vomiting)   . Preterm labor   . Urinary incontinence     Patient Active Problem List   Diagnosis Date Noted  . Sinus tachycardia 11/15/2017  . Mid-back pain, acute 10/08/2017  . 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 Palpitations 06/15/2017  . Migraine 08/11/2016  . Menorrhagia with regular cycle 05/14/2015  . HTN (hypertension), benign 05/14/2015  . Prediabetes 05/14/2015  . Overweight (BMI 25.0-29.9)  02/14/2015  . GERD (gastroesophageal reflux disease) 02/14/2015  . History of migraine 02/14/2015  . Urge incontinence 02/14/2015  . Genital herpes 02/14/2015  . S/P tubal ligation 11/16/2012    Past Surgical History:  Procedure Laterality Date  . CERVIX LESION DESTRUCTION    . CESAREAN SECTION  2005, 2010   x2  . CESAREAN SECTION WITH BILATERAL TUBAL LIGATION Bilateral 11/15/2012   Procedure: REPEAT CESAREAN SECTION WITH BILATERAL TUBAL LIGATION;  Surgeon: Kirkland Hun, MD;  Location: [facility];  Service: Obstetrics;  Laterality: Bilateral;  . COLPOSCOPY    . UNILATERAL SALPINGECTOMY Right 11/15/2012   Procedure: UNILATERAL SALPINGECTOMY;  Surgeon: Kirkland Hun, MD;  Location: [facility];  Service: Obstetrics;  Laterality: Right;  . WISDOM TOOTH EXTRACTION       OB History    Gravida  3   Para  3   Term  2   Preterm  1   AB      Living  3     SAB      TAB      Ectopic      Multiple      Live Births  3        Obstetric Comments  2005 HYPEREMESIS 2010 MECONIUM ASPIRATION         Home Medications    Prior to Admission medications   Medication Sig Start Date End Date Taking? Authorizing Provider  AJOVY 225 MG/1.5ML SOSY Inject 225 mg into the skin every 30 (thirty)  days. 09/24/17  Yes Anson Fret, MD  amLODipine (NORVASC) 10 MG tablet TAKE 1 TABLET BY MOUTH  DAILY Patient taking differently: Take 10 mg by mouth daily.  03/14/18  Yes Bedsole, Amy E, MD  cyclobenzaprine (FLEXERIL) 10 MG tablet TAKE 1 TABLET (10 MG TOTAL) BY MOUTH AT BEDTIME AS NEEDED FOR MUSCLE SPASMS. 10/05/17   Bedsole, Amy E, MD  losartan (COZAAR) 100 MG tablet Take 1 tablet (100 mg total) by mouth daily. 11/16/17   Dunn, Tacey Ruiz, PA-C  metoprolol succinate (TOPROL-XL) 25 MG 24 hr tablet Take 0.5 tablets (12.5 mg total) by mouth daily. 12/13/17   Dunn, Tacey Ruiz, PA-C  pantoprazole (PROTONIX) 40 MG tablet Take 1 tablet (40 mg total) by mouth 2 (two) times daily before a meal. 04/12/18    Mansouraty, Netty Starring., MD  tolterodine (DETROL LA) 4 MG 24 hr capsule TAKE 1 CAPSULE BY MOUTH  DAILY 03/08/18   Bedsole, Amy E, MD  valACYclovir (VALTREX) 1000 MG tablet Take 1 tablet (1,000 mg total) by mouth 2 (two) times daily. 11/30/16   Excell Seltzer, MD    Family History Family History  Problem Relation Age of Onset  . Anesthesia problems Mother        NAUSEA AND VOMITING  . Thyroid disease Mother   . Fibromyalgia Mother   . Sarcoidosis Mother   . Depression Mother   . Other Mother        VARICOSE VEINS  . Anesthesia problems Other   . Diabetes Father   . Hypertension Sister   . Arthritis Maternal Grandmother   . Heart disease Maternal Grandmother   . Hypertension Maternal Grandmother   . Kidney disease Maternal Grandmother        FAILURE  . Prostate cancer Maternal Grandfather   . Depression Maternal Aunt   . Sarcoidosis Maternal Aunt   . Asthma Cousin   . Liver disease Cousin        requiring liver transplant- unknown as to what type of liver disease  . Colon cancer Neg Hx   . Esophageal cancer Neg Hx   . Pancreatic cancer Neg Hx   . Stomach cancer Neg Hx     Social History Social History   Tobacco Use  . Smoking status: Never Smoker  . Smokeless tobacco: Never Used  Substance Use Topics  . Alcohol use: Yes    Alcohol/week: 3.0 standard drinks    Types: 2 Glasses of wine, 1 Shots of liquor per week    Comment: weekends  . Drug use: No     Allergies   Amoxicillin and Dexilant [dexlansoprazole]   Review of Systems Review of Systems  Constitutional: Negative for appetite change, chills, fatigue and fever.  HENT: Positive for ear pain, sinus pressure and sinus pain. Negative for congestion, ear discharge, facial swelling, postnasal drip and tinnitus.   Respiratory: Negative for chest tightness, shortness of breath and wheezing.   Cardiovascular: Positive for chest pain and palpitations. Negative for leg swelling.  Gastrointestinal: Negative for  abdominal distention, abdominal pain, blood in stool, constipation, diarrhea and nausea.  Genitourinary: Negative for difficulty urinating, dysuria and flank pain.  Skin: Negative for color change.  Neurological: Positive for dizziness, light-headedness and headaches. Negative for syncope, weakness and numbness.  Ten systems reviewed and are negative for acute change, except as noted in the HPI.    Physical Exam Updated Vital Signs BP 128/87   Pulse 100   Temp 98.9 F (37.2 C) (Oral)  Resp 16   SpO2 97%   Physical Exam  Constitutional: She is oriented to person, place, and time. She appears well-developed and well-nourished. No distress.  HENT:  Head: Normocephalic and atraumatic.  Eyes: EOM are normal.  Neck: No JVD present.  Cardiovascular: Normal heart sounds. Tachycardia present.  No murmur heard. Pulmonary/Chest: Effort normal and breath sounds normal. No respiratory distress. She has no wheezes. She has no rales. She exhibits no tenderness.  Musculoskeletal: She exhibits no edema.       Arms: Neurological: She is alert and oriented to person, place, and time.  Skin: Skin is warm and dry. She is not diaphoretic.     ED Treatments / Results  Labs (all labs ordered are listed, but only abnormal results are displayed) Labs Reviewed  BASIC METABOLIC PANEL - Abnormal; Notable for the following components:      Result Value   Potassium 2.8 (*)    Glucose, Bld 171 (*)    All other components within normal limits  URINALYSIS, ROUTINE W REFLEX MICROSCOPIC - Abnormal; Notable for the following components:   Color, Urine STRAW (*)    Hgb urine dipstick SMALL (*)    Bacteria, UA RARE (*)    All other components within normal limits  CBC  D-DIMER, QUANTITATIVE (NOT AT [facility])  I-STAT TROPONIN, ED  I-STAT BETA HCG BLOOD, ED ([facility], [facility], [facility] ONLY)    EKG None  Radiology Dg Chest 2 View  Result Date: 04/14/2018 CLINICAL DATA:  Intermittent dizziness for the past week.  Recurrent left chest wall pain and palpitations. EXAM: CHEST - 2 VIEW COMPARISON:  Thoracic spine radiographs dated 10/08/2017. FINDINGS: Normal sized heart. Clear lungs. Normal vascularity. Unremarkable bones. IMPRESSION: Normal examination. Electronically Signed   By: Beckie SaltsSteven  Reid M.D.   On: 04/14/2018 16:03    Procedures Procedures (including critical care time)  Medications Ordered in ED Medications  sodium chloride 0.9 % bolus 1,500 mL (1,500 mLs Intravenous New Bag/Given 04/14/18 1747)  potassium chloride SA (K-DUR,KLOR-CON) CR tablet 40 mEq (has no administration in time range)     Initial Impression / Assessment and Plan / ED Course  I have reviewed the triage vital signs and the nursing notes.  Pertinent labs & imaging results that were available during my care of the patient were reviewed by me and considered in my medical decision making (see chart for details).  Clinical Course as of Apr 14 1816  Thu Apr 14, 2018  1556 History of tachycardia treated with metoprolol. Ongoing chest pain for several months w/negative workup in February with echo and stress test. D-dimer to rule out PE , low risk with no recent travel, surgery, or history. Hypokalemia, which could explain long QT on EKG. Repleting K and will give fluids. HR trending down.    [JS]    Clinical Course User Index [JS] Seawell, Jaimie A, DO    32yo female with chronic chest pain and two weeks of dizziness sent to ED earlier today by PCP for symptoms. Troponin, chest x-ray, D-dimer and labs unremarkable. Normal orthostatics. Found to be hypokalemic with prolonged QT. Potassium repleted and will send with prescription for the next few days. She does not appear at risk for ACS, PE, or other acute process currently. Given 1.5L bolus as well and recommend close follow-up with PCP.   Final Clinical Impressions(s) / ED Diagnoses   Final diagnoses:  Hypokalemia  Dehydration  Musculoskeletal chest pain  Dizziness    ED  Discharge Orders  None       Versie Starks, DO 04/14/18 1817    Azalia Bilis, MD 04/15/18 [phone]

## 2018-04-14 NOTE — Discharge Instructions (Addendum)
Thank you for allowing us to be a part of your care.   Please follow-up with your primary care physician in the next couple of days.   Your potassium was found to be low, please take  Potassium chloride 10mEq one tablet once per day for the next four days.

## 2018-04-14 NOTE — ED Triage Notes (Signed)
Patient to ED c/o dizziness for about a week off and on - thought r/t Dexilant and was taken off this medication over the weekend. Patient also c/o recurrent chest wall pain and palpitations. States she has had a cardiac work-up in the past but everything came back okay. Denies shortness of breath, N/V.

## 2018-04-14 NOTE — ED Notes (Signed)
Patient verbalizes understanding of medications and discharge instructions. No further questions at this time. VSS and patient ambulatory at discharge.   

## 2018-04-15 ENCOUNTER — Telehealth: Payer: Self-pay

## 2018-04-15 ENCOUNTER — Encounter: Payer: Self-pay | Admitting: Gastroenterology

## 2018-04-15 DIAGNOSIS — R1319 Other dysphagia: Secondary | ICD-10-CM | POA: Insufficient documentation

## 2018-04-15 DIAGNOSIS — R12 Heartburn: Secondary | ICD-10-CM | POA: Insufficient documentation

## 2018-04-15 DIAGNOSIS — R1310 Dysphagia, unspecified: Secondary | ICD-10-CM | POA: Insufficient documentation

## 2018-04-15 NOTE — Telephone Encounter (Signed)
Noted  

## 2018-04-15 NOTE — Telephone Encounter (Signed)
Left message for patient to call back to follow up after ER visit and schedule an appointment with Dr Ermalene SearingBedsole for follow up

## 2018-04-18 NOTE — Telephone Encounter (Signed)
Patient has an appointment on 04/22/18.

## 2018-04-22 ENCOUNTER — Ambulatory Visit: Payer: Managed Care, Other (non HMO) | Admitting: Family Medicine

## 2018-04-22 ENCOUNTER — Encounter: Payer: Self-pay | Admitting: Family Medicine

## 2018-04-22 VITALS — BP 130/80 | HR 94 | Temp 98.7°F | Ht 63.5 in | Wt 166.2 lb

## 2018-04-22 DIAGNOSIS — R002 Palpitations: Secondary | ICD-10-CM | POA: Diagnosis not present

## 2018-04-22 DIAGNOSIS — E876 Hypokalemia: Secondary | ICD-10-CM | POA: Diagnosis not present

## 2018-04-22 DIAGNOSIS — K219 Gastro-esophageal reflux disease without esophagitis: Secondary | ICD-10-CM | POA: Diagnosis not present

## 2018-04-22 NOTE — Progress Notes (Signed)
Subjective:    Patient ID: Angela [name], female    DOB: 08/16/1985, 32 y.o.   MRN: [id number]  HPI   32 year old female presents following ER visit  12/5 for chest wall pain.   Found to be mildly dehydrated and hypokalemic ( K 2.8).  EKG  Reviewed. Troponin, chest x-ray, D-dimer and labs unremarkable. Normal orthostatics.  Given fluids and potassium supplement   Started on dexilant in 03/2018  Saw GI for GERD 12/3.Marland Kitchen. protoinix was increased to 40 BID, scheduled endoscopy Dexilant had seem to be associated with dizziness, but when stopped it it did not go away.    HAd been forcing herself to eat.. limited po intake.. wasn't hungry.   On metoprolol for tachycardia per cardiology. Recent ECHO and stress test in February within normal limits   She now feels better after getting potassium and fluids.  Less headache , less dizziness.  She is on reflux BID.Marland Kitchen. still having some heartburn but not as bad.  January schedule endoscopy.  Wt Readings from Last 3 Encounters:  04/22/18 166 lb 4 oz (75.4 kg)  04/12/18 163 lb 6.4 oz (74.1 kg)  04/01/18 166 lb (75.3 kg)    Social History /Family History/Past Medical History reviewed in detail and updated in EMR if needed. Blood pressure 130/80, pulse 94, temperature 98.7 F (37.1 C), temperature source Oral, height 5' 3.5" (1.613 m), weight 166 lb 4 oz (75.4 kg).  Review of Systems  Constitutional: Negative for fatigue and fever.  HENT: Negative for congestion.   Eyes: Negative for pain.  Respiratory: Negative for cough and shortness of breath.   Cardiovascular: Negative for chest pain, palpitations and leg swelling.  Gastrointestinal: Negative for abdominal pain.  Genitourinary: Negative for dysuria and vaginal bleeding.  Musculoskeletal: Negative for back pain.  Neurological: Negative for syncope, light-headedness and headaches.  Psychiatric/Behavioral: Negative for dysphoric mood.       Objective:   Physical  Exam Constitutional:      General: She is not in acute distress.    Appearance: Normal appearance. She is well-developed. She is not ill-appearing or toxic-appearing.  HENT:     Head: Normocephalic.     Right Ear: Hearing, tympanic membrane, ear canal and external ear normal. Tympanic membrane is not erythematous, retracted or bulging.     Left Ear: Hearing, tympanic membrane, ear canal and external ear normal. Tympanic membrane is not erythematous, retracted or bulging.     Nose: No mucosal edema or rhinorrhea.     Right Sinus: No maxillary sinus tenderness or frontal sinus tenderness.     Left Sinus: No maxillary sinus tenderness or frontal sinus tenderness.     Mouth/Throat:     Pharynx: Uvula midline.  Eyes:     General: Lids are normal. Lids are everted, no foreign bodies appreciated.     Conjunctiva/sclera: Conjunctivae normal.     Pupils: Pupils are equal, round, and reactive to light.  Neck:     Musculoskeletal: Normal range of motion and neck supple.     Thyroid: No thyroid mass or thyromegaly.     Vascular: No carotid bruit.     Trachea: Trachea normal.  Cardiovascular:     Rate and Rhythm: Normal rate and regular rhythm.     Pulses: Normal pulses.     Heart sounds: Normal heart sounds, S1 normal and S2 normal. No murmur. No friction rub. No gallop.   Pulmonary:     Effort: Pulmonary effort is normal. No tachypnea  or respiratory distress.     Breath sounds: Normal breath sounds. No decreased breath sounds, wheezing, rhonchi or rales.  Chest:       Comments:  Area of chronic ttp Abdominal:     General: Bowel sounds are normal.     Palpations: Abdomen is soft.     Tenderness: There is no abdominal tenderness.  Skin:    General: Skin is warm and dry.     Findings: No rash.  Neurological:     Mental Status: She is alert and oriented to person, place, and time.     GCS: GCS eye subscore is 4. GCS verbal subscore is 5. GCS motor subscore is 6.     Cranial Nerves: No  cranial nerve deficit.     Sensory: No sensory deficit.     Motor: No abnormal muscle tone.     Coordination: Coordination normal.     Gait: Gait normal.     Deep Tendon Reflexes: Reflexes are normal and symmetric.     Comments: Nml cerebellar exam   No papilledema  Psychiatric:        Mood and Affect: Mood is not anxious or depressed.        Speech: Speech normal.        Behavior: Behavior normal. Behavior is cooperative.        Thought Content: Thought content normal.        Cognition and Memory: Memory is not impaired. She does not exhibit impaired recent memory or impaired remote memory.        Judgment: Judgment normal.           Assessment & Plan:

## 2018-04-22 NOTE — Assessment & Plan Note (Signed)
Stable control on BBlocker 

## 2018-04-22 NOTE — Assessment & Plan Note (Signed)
Has endoscopy upcoming to eval  Cause of treatment resistant GERD.

## 2018-04-22 NOTE — Assessment & Plan Note (Signed)
Possibly due to PPI and recent decrease po.  Now feeling better after supplement and back on regualr diet.  Re-eval today.

## 2018-04-23 LAB — BASIC METABOLIC PANEL
BUN/Creatinine Ratio: 12 (ref 9–23)
BUN: 8 mg/dL (ref 6–20)
CO2: 22 mmol/L (ref 20–29)
CREATININE: 0.68 mg/dL (ref 0.57–1.00)
Calcium: 9.7 mg/dL (ref 8.7–10.2)
Chloride: 98 mmol/L (ref 96–106)
GFR calc Af Amer: 133 mL/min/{1.73_m2} (ref >59)
GFR calc non Af Amer: 115 mL/min/{1.73_m2} (ref >59)
Glucose: 87 mg/dL (ref 65–99)
Potassium: 4 mmol/L (ref 3.5–5.2)
Sodium: 138 mmol/L (ref 134–144)

## 2018-04-25 NOTE — Assessment & Plan Note (Signed)
Refer to GI as not improving as expected.  Stop any ibuprofen aleve, diclofenac etc. Can use tylenol for headache other pain.  Avoid triggers.  Stop pantoprazole... change to dexilant.

## 2018-05-12 ENCOUNTER — Telehealth: Payer: Self-pay | Admitting: Family Medicine

## 2018-05-12 NOTE — Telephone Encounter (Signed)
[facility] faxed accommodation medical assessment form In dr Ermalene Searing in box for review and signature

## 2018-05-13 DIAGNOSIS — Z0279 Encounter for issue of other medical certificate: Secondary | ICD-10-CM

## 2018-05-16 ENCOUNTER — Other Ambulatory Visit: Payer: Self-pay | Admitting: Neurology

## 2018-05-16 MED ORDER — AJOVY 225 MG/1.5ML ~~LOC~~ SOSY: 225.0000 mg | PREFILLED_SYRINGE | SUBCUTANEOUS | 5 refills | Status: DC

## 2018-05-17 ENCOUNTER — Encounter: Payer: Self-pay | Admitting: *Deleted

## 2018-05-17 NOTE — Telephone Encounter (Signed)
Left message asking pt to call office Her fmla paperwork was faxed 05/13/2018  Copy for pt Copy for scan Copy for billing

## 2018-05-18 ENCOUNTER — Telehealth: Payer: Self-pay | Admitting: *Deleted

## 2018-05-18 ENCOUNTER — Telehealth: Payer: Self-pay | Admitting: Gastroenterology

## 2018-05-18 NOTE — Telephone Encounter (Signed)
Spoke with [facility] and received pt's most recent insurance information.   [employer] BIN: [id number] PCN: [id number] GROUP: [facility] ID: [id number]

## 2018-05-18 NOTE — Telephone Encounter (Signed)
Completed Ajovy 225 mg PA on Cover My Meds. KEY: [id number]. Awaiting determination from [employer]. No specific time frame given.

## 2018-05-18 NOTE — Telephone Encounter (Signed)
noted 

## 2018-05-18 NOTE — Telephone Encounter (Signed)
Patient states she is sending over paper work that needs to be filled out so that she can be excused for work the day of her procedure on 1.16.2020. FYI

## 2018-05-19 NOTE — Telephone Encounter (Signed)
Received determination from [employer]. Angela [name] has been denied. Preferred trials of Aimovig and Emgality first.   Will discuss options with Dr. Lucia Gaskins, such as use savings card, change therapy, or appeal. Option also for peer to peer discussion.  If we should choose to appeal, send to [phone].

## 2018-05-19 NOTE — Telephone Encounter (Signed)
She is doing so well on Ajovy, can she use the copay card?

## 2018-05-23 NOTE — Telephone Encounter (Signed)
Yes there is a new savings card for 2020. I am in contact with patient via mychart about the savings card.

## 2018-05-26 ENCOUNTER — Encounter: Payer: Self-pay | Admitting: Gastroenterology

## 2018-05-26 ENCOUNTER — Ambulatory Visit (AMBULATORY_SURGERY_CENTER): Payer: Managed Care, Other (non HMO) | Admitting: Gastroenterology

## 2018-05-26 VITALS — BP 114/70 | HR 90 | Temp 98.7°F | Resp 16 | Ht 63.0 in | Wt 163.0 lb

## 2018-05-26 DIAGNOSIS — K2970 Gastritis, unspecified, without bleeding: Secondary | ICD-10-CM

## 2018-05-26 DIAGNOSIS — K219 Gastro-esophageal reflux disease without esophagitis: Secondary | ICD-10-CM | POA: Diagnosis not present

## 2018-05-26 DIAGNOSIS — K449 Diaphragmatic hernia without obstruction or gangrene: Secondary | ICD-10-CM | POA: Diagnosis not present

## 2018-05-26 DIAGNOSIS — B9681 Helicobacter pylori [H. pylori] as the cause of diseases classified elsewhere: Secondary | ICD-10-CM

## 2018-05-26 MED ORDER — SODIUM CHLORIDE 0.9 % IV SOLN: 500.0000 mL | Freq: Once | INTRAVENOUS | Status: DC

## 2018-05-26 NOTE — Progress Notes (Signed)
Called to room to assist during endoscopic procedure.  Patient ID and intended procedure confirmed with present staff. Received instructions for my participation in the procedure from the performing physician.  

## 2018-05-26 NOTE — Op Note (Signed)
[facility] Patient Name: Angela [name] Procedure Date: 05/26/2018 10:24 AM MRN: [id number] Endoscopist: Justice Britain , MD Age: 33 Referring MD:  Date of Birth: Aug 14, 1985 Gender: Female Account #: [id number] Procedure:                Upper GI endoscopy Indications:              Dyspepsia, Dysphagia, Heartburn Medicines:                Monitored Anesthesia Care Procedure:                Pre-Anesthesia Assessment:                           - Prior to the procedure, a History and Physical                            was performed, and patient medications and                            allergies were reviewed. The patient's tolerance of                            previous anesthesia was also reviewed. The risks                            and benefits of the procedure and the sedation                            options and risks were discussed with the patient.                            All questions were answered, and informed consent                            was obtained. Prior Anticoagulants: The patient has                            taken no previous anticoagulant or antiplatelet                            agents. ASA Grade Assessment: II - A patient with                            mild systemic disease. After reviewing the risks                            and benefits, the patient was deemed in                            satisfactory condition to undergo the procedure.                           After obtaining informed consent, the endoscope was  passed under direct vision. Throughout the                            procedure, the patient's blood pressure, pulse, and                            oxygen saturations were monitored continuously. The                            Model GIF-HQ190 [id number]) scope was introduced                            through the mouth, and advanced to the second part                            of duodenum.  The upper GI endoscopy was                            accomplished without difficulty. The patient                            tolerated the procedure. Scope In: Scope Out: Findings:                 No gross lesions were noted in the proximal                            esophagus, in the mid esophagus and in the distal                            esophagus. Biopsies were taken with a cold forceps                            for histology from the proximal/middle esophagus                            for EoE. Biopsies were taken with a cold forceps                            for histology from the distal esophagus for EoE.                           A small hiatal hernia was found. The proximal                            extent of the gastric folds (end of tubular                            esophagus) was 35 cm from the incisors. The hiatal                            narrowing was 36 cm from the incisors. The Z-line  was 34 cm from the incisors.                           The gastroesophageal flap valve was visualized                            endoscopically and classified as Hill Grade III                            (minimal fold, loose to endoscope, hiatal hernia                            likely).                           No gross lesions were noted in the entire examined                            stomach. Biopsies were taken with a cold forceps                            for histology and Helicobacter pylori testing from                            the antrum/incisura/greater curve/lesser curve.                           No gross lesions were noted in the duodenal bulb,                            in the first portion of the duodenum and in the                            second portion of the duodenum. Complications:            No immediate complications. Estimated Blood Loss:     Estimated blood loss was minimal. Impression:               - No gross lesions in  esophagus. Biopsied for EoE.                            Small hiatal hernia. Gastroesophageal flap valve                            classified as Hill Grade III (minimal fold, loose                            to endoscope, hiatal hernia likely).                           - No gross lesions in the stomach. Biopsied for HP.                           - No gross lesions in the duodenal bulb, in the  first portion of the duodenum and in the second                            portion of the duodenum. Recommendation:           - The patient will be observed post-procedure,                            until all discharge criteria are met.                           - Discharge patient to home.                           - Patient has a contact number available for                            emergencies. The signs and symptoms of potential                            delayed complications were discussed with the                            patient. Return to normal activities tomorrow.                            Written discharge instructions were provided to the                            patient.                           - Resume previous diet.                           - Await pathology results.                           - Continue PPI BID for next 8-weeks.                           - Will follow up in clinic after PPI continuation                            in approximately 6-8 weeks.                           Based on symptoms at time will try and decrease PPI                            if possible. If symptomer persist in regards to                            heartburn/pyrosis, will consider pH Impedence  testing and Manometry in regards to consideration                            for future Fundoplication needs but also to rule                            out hypersensitive esophagus.                           - The findings and recommendations  were discussed                            with the patient.                           - The findings and recommendations were discussed                            with the patient's family. Justice Britain, MD 05/26/2018 10:42:24 AM

## 2018-05-26 NOTE — Patient Instructions (Signed)
Handouts given on hiatal hernia (GERD protocol). Continue PPI (pantoprazole-Protonix) for the next 8 weeks. His nurse from clinic will call you for future appts.   YOU HAD AN ENDOSCOPIC PROCEDURE TODAY AT THE [facility]:   Refer to the procedure report that was given to you for any specific questions about what was found during the examination.  If the procedure report does not answer your questions, please call your gastroenterologist to clarify.  If you requested that your care partner not be given the details of your procedure findings, then the procedure report has been included in a sealed envelope for you to review at your convenience later.  YOU SHOULD EXPECT: Some feelings of bloating in the abdomen. Passage of more gas than usual.  Walking can help get rid of the air that was put into your GI tract during the procedure and reduce the bloating. If you had a lower endoscopy (such as a colonoscopy or flexible sigmoidoscopy) you may notice spotting of blood in your stool or on the toilet paper. If you underwent a bowel prep for your procedure, you may not have a normal bowel movement for a few days.  Please Note:  You might notice some irritation and congestion in your nose or some drainage.  This is from the oxygen used during your procedure.  There is no need for concern and it should clear up in a day or so.  SYMPTOMS TO REPORT IMMEDIATELY:    Following upper endoscopy (EGD)  Vomiting of blood or coffee ground material  New chest pain or pain under the shoulder blades  Painful or persistently difficult swallowing  New shortness of breath  Fever of 100F or higher  Black, tarry-looking stools  For urgent or emergent issues, a gastroenterologist can be reached at any hour by calling [phone].   DIET:  We do recommend a small meal at first, but then you may proceed to your regular diet.  Drink plenty of fluids but you should avoid alcoholic beverages for 24  hours.  ACTIVITY:  You should plan to take it easy for the rest of today and you should NOT DRIVE or use heavy machinery until tomorrow (because of the sedation medicines used during the test).    FOLLOW UP: Our staff will call the number listed on your records the next business day following your procedure to check on you and address any questions or concerns that you may have regarding the information given to you following your procedure. If we do not reach you, we will leave a message.  However, if you are feeling well and you are not experiencing any problems, there is no need to return our call.  We will assume that you have returned to your regular daily activities without incident.  If any biopsies were taken you will be contacted by phone or by letter within the next 1-3 weeks.  Please call us at [phone] if you have not heard about the biopsies in 3 weeks.    SIGNATURES/CONFIDENTIALITY: You and/or your care partner have signed paperwork which will be entered into your electronic medical record.  These signatures attest to the fact that that the information above on your After Visit Summary has been reviewed and is understood.  Full responsibility of the confidentiality of this discharge information lies with you and/or your care-partner.

## 2018-05-26 NOTE — Progress Notes (Signed)
To PACU, VSS. Report to [email or web address].tb 

## 2018-05-27 ENCOUNTER — Telehealth: Payer: Self-pay

## 2018-05-27 ENCOUNTER — Telehealth: Payer: Self-pay | Admitting: *Deleted

## 2018-05-27 ENCOUNTER — Encounter: Payer: Self-pay | Admitting: *Deleted

## 2018-05-27 NOTE — Telephone Encounter (Signed)
Left message on f/u call 

## 2018-05-27 NOTE — Telephone Encounter (Signed)
  Follow up Call-  Call back number 05/26/2018  Post procedure Call Back phone  # [phone]  Permission to leave phone message Yes  Some recent data might be hidden   [facility]

## 2018-05-27 NOTE — Telephone Encounter (Signed)
Pt is returning your call

## 2018-05-27 NOTE — Telephone Encounter (Signed)
Appeal fax to [phone]. Fax confirmed..Marland Kitchen

## 2018-06-01 ENCOUNTER — Encounter: Payer: Self-pay | Admitting: Gastroenterology

## 2018-06-01 ENCOUNTER — Telehealth: Payer: Self-pay | Admitting: *Deleted

## 2018-06-01 MED ORDER — PANTOPRAZOLE SODIUM 40 MG PO TBEC: 40.0000 mg | DELAYED_RELEASE_TABLET | Freq: Two times a day (BID) | ORAL | 3 refills | Status: DC

## 2018-06-01 NOTE — Telephone Encounter (Signed)
Sent in refills of protonix to [facility] per fax request from pharmacy

## 2018-06-02 ENCOUNTER — Other Ambulatory Visit: Payer: Self-pay

## 2018-06-02 MED ORDER — BIS SUBCIT-METRONID-TETRACYC 140-125-125 MG PO CAPS: 3.0000 | ORAL_CAPSULE | Freq: Three times a day (TID) | ORAL | 0 refills | Status: DC

## 2018-06-06 ENCOUNTER — Telehealth: Payer: Self-pay | Admitting: Gastroenterology

## 2018-06-06 NOTE — Telephone Encounter (Signed)
Spoke to the [facility]ist at [facility]. She ran the cost for an additional 10 day pack and to discard 4 days worth on the Pylera. There will be no additional charge the total cost will be $50 for the full 14 days worth. Patient has been notofied

## 2018-06-06 NOTE — Telephone Encounter (Signed)
[facility] called regarding prescription for Pylera sent 1.23.20.  Dr. Meridee Score prescribed for 14 days but the drug comes in a 10-pack.  Pharmacist would like to if whether to dispense two 10-packs or dispense a single 10-day pack.

## 2018-06-07 NOTE — Telephone Encounter (Signed)
Received a fax from [facility]. The Ajovy appeal has been denied. Insurance requires that patient try and fail Aimovig and Emgality first. Insurance considers Ajovy not medically necessary since pt has not tried/failed these medications.  Patient stated in email that she is not able to use the free copay card.

## 2018-06-08 ENCOUNTER — Encounter: Payer: Self-pay | Admitting: *Deleted

## 2018-06-08 NOTE — Telephone Encounter (Signed)
Patient loves the Ajovy, she is "thrilled" with it. Can she continue to use the copay card?

## 2018-06-08 NOTE — Telephone Encounter (Signed)
If not lets switch to Aimovig. thanks

## 2018-06-08 NOTE — Telephone Encounter (Signed)
I called [facility] on [address]. The staff member stated that they are getting a message of "claim does not qualify" even when using card to bypass insurance.

## 2018-06-08 NOTE — Telephone Encounter (Signed)
Messaged pt in mychart.

## 2018-06-10 ENCOUNTER — Telehealth: Payer: Self-pay

## 2018-06-10 NOTE — Telephone Encounter (Signed)
Prior Authorization request form faxed to [facility] [phone].  Form sent to be scanned Codes K21.0, K29.70, B96.81 per [place]

## 2018-06-13 ENCOUNTER — Other Ambulatory Visit: Payer: Self-pay | Admitting: *Deleted

## 2018-06-13 MED ORDER — GALCANEZUMAB-GNLM 120 MG/ML ~~LOC~~ SOAJ: 120.0000 mg | SUBCUTANEOUS | 2 refills | Status: DC

## 2018-06-14 ENCOUNTER — Telehealth: Payer: Self-pay | Admitting: *Deleted

## 2018-06-14 NOTE — Telephone Encounter (Signed)
Request Reference Number: [id number]. EMGALITY INJ 120MG /ML is approved through 12/13/2018. For further questions, call [phone].

## 2018-06-14 NOTE — Telephone Encounter (Addendum)
Completed PA for Emgality 120 mg on Cover My Meds. KEY: [id number]. Awaiting determination from [facility]. No time frame given.

## 2018-07-01 ENCOUNTER — Encounter: Payer: Self-pay | Admitting: Gastroenterology

## 2018-07-01 ENCOUNTER — Ambulatory Visit: Payer: Managed Care, Other (non HMO) | Admitting: Gastroenterology

## 2018-07-01 VITALS — BP 110/68 | HR 85 | Ht 63.0 in | Wt 164.0 lb

## 2018-07-01 DIAGNOSIS — K219 Gastro-esophageal reflux disease without esophagitis: Secondary | ICD-10-CM

## 2018-07-01 DIAGNOSIS — R1319 Other dysphagia: Secondary | ICD-10-CM

## 2018-07-01 DIAGNOSIS — K449 Diaphragmatic hernia without obstruction or gangrene: Secondary | ICD-10-CM

## 2018-07-01 DIAGNOSIS — A048 Other specified bacterial intestinal infections: Secondary | ICD-10-CM

## 2018-07-01 DIAGNOSIS — R1310 Dysphagia, unspecified: Secondary | ICD-10-CM | POA: Diagnosis not present

## 2018-07-01 NOTE — Progress Notes (Signed)
GASTROENTEROLOGY OUTPATIENT CLINIC VISIT   Primary Care Provider Angela [name], Angela E, MD [address] [place] [id number] [phone]  Patient Profile: Angela [name] is a 33 y.o. female with a pmh significant for GERD, hypertension, migraines, urinary incontinence, hiatal hernia, H. pylori infection.  The patient presents to the [facility] for an evaluation and management of problem(s) noted below:  Problem List 1. Helicobacter pylori (H. pylori) infection   2. Gastroesophageal reflux disease, esophagitis presence not specified   3. Esophageal dysphagia     History of Present Illness: Please see initial consultation note for full details of HPI.     This is patient's first visit to the GI of our clinic.  She has had a longstanding history of reported heartburn.  She has a sensation of reflux and burning in the mid chest.  She feels at times some solid food dysphagia and this is been worse over the course the last few weeks.  She is never had to regurgitate food in the past.  She noted that fried foods/greasy foods/spicy foods can cause her to have more significant symptoms of heartburn at times but they do not necessarily make it more difficult for food to pass.  Within the last year she lost 10 pounds but she is done that intentionally with a diet.  In the last 6 months she has had no change in her weight.  She has used Tums on top of Protonix daily.  She has been on Protonix daily for over a year.  She had trialed Dexilant after talking with her PCP with her symptoms but she developed nausea and dizziness and so she stopped this.  She feels a sensation of heartburn is worse in the afternoon and evening.  She has never had an upper endoscopy or colonoscopy.  He does not take significant nonsteroidals or BC/Goody powders  Interval History Today, the patient returns for scheduled follow-up.  Since our initial consultation note she underwent an upper  endoscopy.  She has a hiatal hernia.  Even though she had normal-appearing stomach biopsies returned positive for H. pylori.  We have treated her and she completed her Pylera on February 9.  She feels exceedingly well after completing her therapy.  She is only had to use Tums on one occasion.  She describes no significant further dysphagia symptoms.  She has no other complaints at this point in time and is hopeful that she continues to feel well moving forward.  GI Review of Systems Positive as above Negative for odynophagia, jaundice, nausea, vomiting, abdominal pain, change in bowel habits, melena, hematochezia  Review of Systems General: Denies fevers/chills HEENT: Denies oral lesions Cardiovascular: Denies chest pain Pulmonary: Denies shortness of breath/nocturnal cough Gastroenterological: See HPI Genitourinary: Denies darkened urine Hematological: Denies easy bruising/bleeding Dermatological: Denies jaundice Psychological: Mood is stable   Medications Current Outpatient Medications  Medication Sig Dispense Refill  . amLODipine (NORVASC) 10 MG tablet TAKE 1 TABLET BY MOUTH  DAILY (Patient taking differently: Take 10 mg by mouth daily. ) 90 tablet 1  . cyclobenzaprine (FLEXERIL) 10 MG tablet TAKE 1 TABLET (10 MG TOTAL) BY MOUTH AT BEDTIME AS NEEDED FOR MUSCLE SPASMS. 15 tablet 0  . Galcanezumab-gnlm (EMGALITY) 120 MG/ML SOAJ Inject 120 mg into the skin every 30 (thirty) days. 1 pen 2  . losartan (COZAAR) 100 MG tablet Take 1 tablet (100 mg total) by mouth daily. 90 tablet 3  . metoprolol succinate (TOPROL-XL) 25 MG 24 hr tablet  Take 0.5 tablets (12.5 mg total) by mouth daily. 45 tablet 3  . pantoprazole (PROTONIX) 40 MG tablet Take 1 tablet (40 mg total) by mouth 2 (two) times daily before a meal. 180 tablet 3  . potassium chloride (K-DUR) 10 MEQ tablet Take 1 tablet (10 mEq total) by mouth daily. 4 tablet 0  . tolterodine (DETROL LA) 4 MG 24 hr capsule TAKE 1 CAPSULE BY MOUTH  DAILY  90 capsule 3  . valACYclovir (VALTREX) 1000 MG tablet Take 1 tablet (1,000 mg total) by mouth 2 (two) times daily. 20 tablet 0   Current Facility-Administered Medications  Medication Dose Route Frequency Provider Last Rate Last Dose  . 0.9 %  sodium chloride infusion  500 mL Intravenous Once Angela [name], Angela StarringGabriel Jr., MD        Allergies Allergies  Allergen Reactions  . Amoxicillin Hives    Has patient had a PCN reaction causing immediate rash, facial/tongue/throat swelling, SOB or lightheadedness with hypotension: No Has patient had a PCN reaction causing severe rash involving mucus membranes or skin necrosis: Yes Has patient had a PCN reaction that required hospitalization No Has patient had a PCN reaction occurring within the last 10 years: No If all of the above answers are "NO", then may proceed with Cephalosporin use.   Marland Kitchen. Dexilant [Dexlansoprazole] Nausea Only and Other (See Comments)    dizziness    Histories Past Medical History:  Diagnosis Date  . Abnormal Pap smear AGE 39   COLPO LAST PAP 08/2011  . Allergy   . Anemia   . Complication of anesthesia    NAUSEA; VOMITING; ITCHING; DIFFICULTY BREATHING  . Genital herpes   . GERD (gastroesophageal reflux disease) AGE 35   tums prn  . History of chicken pox   . Hypertension   . Infection AGE 61   CHLAMYDIA; GONORRHEA  . 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 Infection    BV  . Migraines   . Ovarian cyst rupture   . PONV (postoperative nausea and vomiting)   . Preterm labor   . Urinary incontinence    Past Surgical History:  Procedure Laterality Date  . CERVIX LESION DESTRUCTION    . CESAREAN SECTION  2005, 2010   x2  . CESAREAN SECTION WITH BILATERAL TUBAL LIGATION Bilateral 11/15/2012   Procedure: REPEAT CESAREAN SECTION WITH BILATERAL TUBAL LIGATION;  Surgeon: Kirkland HunArthur Stringer, MD;  Location: [facility];  Service: Obstetrics;  Laterality: Bilateral;  . COLPOSCOPY    . UNILATERAL SALPINGECTOMY Right 11/15/2012   Procedure:  UNILATERAL SALPINGECTOMY;  Surgeon: Kirkland HunArthur Stringer, MD;  Location: [facility];  Service: Obstetrics;  Laterality: Right;  . WISDOM TOOTH EXTRACTION     Social History   Socioeconomic History  . Marital status: Married    Spouse name: HASAN  . Number of children: 2  . Years of education: 14+  . Highest education level: Not on file  Occupational History  . Occupation: Retail bankerCHEDULING COORDINATOR    Employer: [employer]  Social Needs  . Financial resource strain: Not on file  . Food insecurity:    Worry: Not on file    Inability: Not on file  . Transportation needs:    Medical: Not on file    Non-medical: Not on file  Tobacco Use  . Smoking status: Never Smoker  . Smokeless tobacco: Never Used  Substance and Sexual Activity  . Alcohol use: Yes    Alcohol/week: 3.0 standard drinks    Types: 2 Glasses of wine,  1 Shots of liquor per week    Comment: weekends  . Drug use: No  . Sexual activity: Yes    Partners: Male    Birth control/protection: Other-see comments    Comment: Ablation  Lifestyle  . Physical activity:    Days per week: Not on file    Minutes per session: Not on file  . Stress: Not on file  Relationships  . Social connections:    Talks on phone: Not on file    Gets together: Not on file    Attends religious service: Not on file    Active member of club or organization: Not on file    Attends meetings of clubs or organizations: Not on file    Relationship status: Not on file  . Intimate partner violence:    Fear of current or ex partner: Not on file    Emotionally abused: Not on file    Physically abused: Not on file    Forced sexual activity: Not on file  Other Topics Concern  . Not on file  Social History Narrative   Lives at home w/ her husband and children   Right-handed   Caffeine: 1 cup coffee daily   Family History  Problem Relation Age of Onset  . Anesthesia problems Mother        NAUSEA AND VOMITING  . Thyroid disease Mother   . Fibromyalgia  Mother   . Sarcoidosis Mother   . Depression Mother   . Other Mother        VARICOSE VEINS  . Anesthesia problems Other   . Diabetes Father   . Hypertension Sister   . Arthritis Maternal Grandmother   . Heart disease Maternal Grandmother   . Hypertension Maternal Grandmother   . Kidney disease Maternal Grandmother        FAILURE  . Prostate cancer Maternal Grandfather   . Depression Maternal Aunt   . Sarcoidosis Maternal Aunt   . Asthma Cousin   . Liver disease Cousin        requiring liver transplant- unknown as to what type of liver disease  . Colon cancer Neg Hx   . Esophageal cancer Neg Hx   . Pancreatic cancer Neg Hx   . Stomach cancer Neg Hx   . Inflammatory bowel disease Neg Hx   . Rectal cancer Neg Hx    I have reviewed her medical, social, and family history in detail and updated the electronic medical record as necessary.    PHYSICAL EXAMINATION  BP 110/68   Pulse 85   Ht 5\' 3"  (1.6 m)   Wt 164 lb (74.4 kg)   BMI 29.05 kg/m  Wt Readings from Last 3 Encounters:  07/01/18 164 lb (74.4 kg)  05/26/18 163 lb (73.9 kg)  04/22/18 166 lb 4 oz (75.4 kg)  GEN: NAD, appears stated age, doesn't appear chronically ill PSYCH: Cooperative, without pressured speech EYE: Conjunctivae pink, sclerae anicteric ENT: MMM, without oral ulcers, no erythema or exudates noted NECK: Supple CV: RR without R/Gs  RESP: CTAB posteriorly, without wheezing GI: NABS, soft, NT/ND, without rebound or guarding, no HSM appreciated MSK/EXT: No lower extremity edema SKIN: No jaundice NEURO:  Alert & Oriented x 3, no focal deficits   REVIEW OF DATA  I reviewed the following data at the time of this encounter:  GI Procedures and Studies  January 2020 EGD - No gross lesions in esophagus. Biopsied for EoE. Small hiatal hernia. Gastroesophageal flap valve classified as Hill  Grade III (minimal fold, loose to endoscope, hiatal hernia likely). - No gross lesions in the stomach. Biopsied for  HP. - No gross lesions in the duodenal bulb, in the first portion of the duodenum and in the second portion of the duodenum.  Laboratory Studies  Reviewed in epic  Imaging Studies  No relevant studies to review   ASSESSMENT  Angela [name] is a 33 y.o. female with a pmh significant for GERD, hypertension, migraines, urinary incontinence, hiatal hernia, H. pylori infection.    The patient is seen today for evaluation and management of:  1. Helicobacter pylori (H. pylori) infection   2. Gastroesophageal reflux disease, esophagitis presence not specified   3. Esophageal dysphagia    Patient has done exceedingly well since her endoscopy and the findings of H. pylori.  It is interesting that her symptoms have improved even in regards to her GERD with H. pylori treatment as there are oftentimes patients who have worsening GERD symptoms after H. pylori has been treated.  We will see how she does.  She will continue PPI therapy at current dose until the end of March.  She will be off PPI therapy for 2 weeks and then return an H. pylori stool antigen to ensure eradication.  Once she has done this she will return to pantoprazole once daily and continue that until we see her back in clinic.    All patient questions were answered, to the best of my ability, and the patient agrees to the aforementioned plan of action with follow-up as indicated.   PLAN  Continue Protonix twice daily until end of March Submit H. pylori stool antigen after 2 weeks off of PPI Reinitiate PPI once daily thereafter Follow-up after Continue lifestyle modifications as discussed previously   Orders Placed This Encounter  Procedures  . Helicobacter pylori special antigen    New Prescriptions   No medications on file   Modified Medications   No medications on file    Planned Follow Up: Return in about 3 months (around 09/29/2018).   Corliss Parish, MD [facility] Office #  [phone]

## 2018-07-01 NOTE — Patient Instructions (Addendum)
Your provider has requested that you go to the basement level for lab work before leaving today. Press "B" on the elevator. The lab is located at the first door on the left as you exit the elevator.   If you are age 34 or older, your body mass index should be between 23-30. Your Body mass index is 29.05 kg/m. If this is out of the aforementioned range listed, please consider follow up with your Primary Care Provider.  If you are age 65 or younger, your body mass index should be between 19-25. Your Body mass index is 29.05 kg/m. If this is out of the aformentioned range listed, please consider follow up with your Primary Care Provider.   Continue Pantoprazole until the end of march. Stop Pantoprazole for 2 weeks, submit stool study to lab located in the basement of our building at that time.    Once stool study has been given you can restart Pantoprazole once daily.   Thank you for choosing me and [facility].  Dr. Meridee Score

## 2018-07-06 ENCOUNTER — Encounter: Payer: Self-pay | Admitting: Gastroenterology

## 2018-07-06 DIAGNOSIS — A048 Other specified bacterial intestinal infections: Secondary | ICD-10-CM | POA: Insufficient documentation

## 2018-07-06 DIAGNOSIS — K449 Diaphragmatic hernia without obstruction or gangrene: Secondary | ICD-10-CM | POA: Insufficient documentation

## 2018-08-01 ENCOUNTER — Other Ambulatory Visit: Payer: Managed Care, Other (non HMO)

## 2018-08-01 DIAGNOSIS — A048 Other specified bacterial intestinal infections: Secondary | ICD-10-CM

## 2018-08-01 DIAGNOSIS — R1319 Other dysphagia: Secondary | ICD-10-CM

## 2018-08-01 DIAGNOSIS — R1310 Dysphagia, unspecified: Secondary | ICD-10-CM

## 2018-08-02 LAB — HELICOBACTER PYLORI  SPECIAL ANTIGEN
MICRO NUMBER:: 345517
SPECIMEN QUALITY: ADEQUATE

## 2018-08-22 ENCOUNTER — Telehealth: Payer: Self-pay | Admitting: Family Medicine

## 2018-08-22 NOTE — Telephone Encounter (Signed)
FMLA paperwork in Dr Daphine Deutscher in box For review and signature

## 2018-08-23 DIAGNOSIS — Z0279 Encounter for issue of other medical certificate: Secondary | ICD-10-CM

## 2018-08-28 ENCOUNTER — Other Ambulatory Visit: Payer: Self-pay | Admitting: Family Medicine

## 2018-09-16 ENCOUNTER — Other Ambulatory Visit: Payer: Self-pay | Admitting: Physician Assistant

## 2018-09-18 ENCOUNTER — Other Ambulatory Visit: Payer: Self-pay | Admitting: Neurology

## 2018-09-21 NOTE — Telephone Encounter (Signed)
Amy NP's doxy link sent to [email or web address] with appt date & time and basic instructions to join meeting.

## 2018-09-24 ENCOUNTER — Other Ambulatory Visit: Payer: Self-pay | Admitting: Physician Assistant

## 2018-09-26 ENCOUNTER — Ambulatory Visit: Payer: Managed Care, Other (non HMO) | Admitting: Neurology

## 2018-09-27 ENCOUNTER — Other Ambulatory Visit: Payer: Self-pay

## 2018-09-27 ENCOUNTER — Encounter: Payer: Self-pay | Admitting: Family Medicine

## 2018-09-27 ENCOUNTER — Ambulatory Visit (INDEPENDENT_AMBULATORY_CARE_PROVIDER_SITE_OTHER): Payer: Managed Care, Other (non HMO) | Admitting: Family Medicine

## 2018-09-27 DIAGNOSIS — G43709 Chronic migraine without aura, not intractable, without status migrainosus: Secondary | ICD-10-CM | POA: Diagnosis not present

## 2018-09-27 DIAGNOSIS — R112 Nausea with vomiting, unspecified: Secondary | ICD-10-CM | POA: Diagnosis not present

## 2018-09-27 MED ORDER — PROMETHAZINE HCL 25 MG PO TABS: 25.0000 mg | ORAL_TABLET | Freq: Four times a day (QID) | ORAL | 0 refills | Status: DC | PRN

## 2018-09-27 NOTE — Progress Notes (Signed)
PATIENT: Angela [name] DOB: August 05, 1985  REASON FOR VISIT: follow up HISTORY FROM: patient  Virtual Visit via Telephone Note  I connected with [name] on 09/27/18 at  8:00 AM EDT by telephone and verified that I am speaking with the correct person using two identifiers.   I discussed the limitations, risks, security and privacy concerns of performing an evaluation and management service by telephone and the availability of in person appointments. I also discussed with the patient that there may be a patient responsible charge related to this service. The patient expressed understanding and agreed to proceed.   History of Present Illness:  09/27/18 Angela [name] is a 33 y.o. female for follow up of migraines. She is doing well on Emgality. She reports that she has about 2-3 migraines per month. She feels that migraines return the week prior to [facility] dose. She does not use abortive therapy. She prefers compression and relaxation. She does have sound and light sensitivity with migraines and also has nausea and vomiting when they are severe.    History (copied from Dr Trevor Mace note on 09/24/2017)  Her headaches are much improved.  2-3 migraines a month baseline was > 15. She is thrilled with Ajovy. No side effects. Will continue. Discussed other options and the other cgrp meds, she is doing so well wont change a thing. Discussed acute management as well.   Interval history: Discussion today with patient regarding Botox versus medications versus the new CGRP medications.  Patient would like to try the new CGRP medications. Provided sample today of Ajovy.  YHT:MBPJPET S Williamsonis a 33 y.o.femalehere as a referral from Dr. Shelly Coss migraines.Past medical history of migraines, hypertension, anemia.Sister with migraines. Headaches worsening and started becoming very intense with nausea, vomiting, dizziness, blurry vision. Started in April with 2 weeks of  hedaches. No inciting events or head trauma. There was stress at work however. Starts in the temples and behind the eys, can be unilateral and also in the back, pounding she can feel her veins and eyes about to pop out. Light and sound bothers her. She has nausea and vomiting. She has ear pain with the headaches but ear exam normal. She sees floaters sometime but no aura. She has 25 headache days a month, 15 are migrainous and can last up to 24 hours. She sometimes has morning headaches. No other focal neurologic deficits, associated symptoms, inciting events or modifiable factors.She was prescribed topiramate but never picked it upbc she read the side effects. She hs her fallopian tubes removed, no more children.No other focal neurologic deficits, associated symptoms, inciting events or modifiable factors.  Meds tried: Imitrex (made her feel weird), flexeril,Labetalol and Topiramate   Reviewed notes, labs and imaging from outside physicians, which showed:   hgba1c 5.6, CMP nml 09/2016  Reviewed notes, patient has been to primary care multiple times and a migraines. Treated with Zofran. She reported using ibuprofen and Imitrex. Acute headaches resolved with Imitrex. She reported headaches every other day in April of this year. Feels a band around her head that is squeezing, associated with nausea, blurred vision and dizziness, photo and phonophobia associated, also noted ear pressure. Triggers include increase in stress. Exam noted that there was no papilledema. She was started on Topamax at bedtime. She did not fill this given fear of side effects. She is using Imitrex for severe headaches and using ibuprofen as well as. The month with headaches usually 1-2 hours but some up to 6-12 hours.  Observations/Objective:  Generalized: Well developed, in no acute distress  Mentation: Alert oriented to time, place, history taking. Follows all commands speech and language fluent   Assessment and  Plan:  33 y.o. year old female  has a past medical history of Abnormal Pap smear (AGE 57), Allergy, Anemia, Complication of anesthesia, Genital herpes, GERD (gastroesophageal reflux disease) (AGE 39), History of chicken pox, Hypertension, Infection (AGE 57), Infection (2007), Infection, Migraines, Ovarian cyst rupture, PONV (postoperative nausea and vomiting), Preterm labor, and Urinary incontinence. with    ICD-10-CM   1. Chronic migraine without aura without status migrainosus, not intractable G43.709   2. Non-intractable vomiting with nausea, unspecified vomiting type R11.2 promethazine (PHENERGAN) 25 MG tablet   She is doing well with Emgality. We will continue monthly injections at this time. We will also give her phenergan 25mg  as needed for nausea and vomiting. She was instructed to use OTC medications as needed but avoid regular use. She does not wish for abortive prescription at this time. She will continue supportive care as well with relaxation and compression. She will follow up in 1 year, sooner if needed.    No orders of the defined types were placed in this encounter.   Meds ordered this encounter  Medications  . promethazine (PHENERGAN) 25 MG tablet    Sig: Take 1 tablet (25 mg total) by mouth every 6 (six) hours as needed for nausea or vomiting.    Dispense:  30 tablet    Refill:  0    Order Specific Question:   Supervising Provider    Answer:   Anson FretAHERN, ANTONIA B [id number]]     Follow Up Instructions:  I discussed the assessment and treatment plan with the patient. The patient was provided an opportunity to ask questions and all were answered. The patient agreed with the plan and demonstrated an understanding of the instructions.   The patient was advised to call back or seek an in-person evaluation if the symptoms worsen or if the condition fails to improve as anticipated.  I provided 20 minutes of non-face-to-face time during this encounter. Patient is located at her  place of residence during video conference. Provider is in the office. Alverda SkeansSandy Young, RN helped to facilitate visit.    Shawnie DapperAmy Kaybree Williams, NP

## 2018-09-27 NOTE — Progress Notes (Signed)
Made any corrections needed, and agree with history, physical, neuro exam,assessment and plan as stated.     Angela Suleiman, MD [facility]  

## 2018-10-12 ENCOUNTER — Emergency Department (HOSPITAL_COMMUNITY)
Admission: EM | Admit: 2018-10-12 | Discharge: 2018-10-12 | Disposition: A | Discharge status: Home / Self Care | Payer: Managed Care, Other (non HMO) | Attending: Emergency Medicine | Admitting: Emergency Medicine

## 2018-10-12 ENCOUNTER — Other Ambulatory Visit: Payer: Self-pay

## 2018-10-12 DIAGNOSIS — I10 Essential (primary) hypertension: Secondary | ICD-10-CM | POA: Diagnosis not present

## 2018-10-12 DIAGNOSIS — Z79899 Other long term (current) drug therapy: Secondary | ICD-10-CM | POA: Diagnosis not present

## 2018-10-12 DIAGNOSIS — R000 Tachycardia, unspecified: Secondary | ICD-10-CM | POA: Diagnosis not present

## 2018-10-12 DIAGNOSIS — E876 Hypokalemia: Secondary | ICD-10-CM | POA: Insufficient documentation

## 2018-10-12 DIAGNOSIS — R002 Palpitations: Secondary | ICD-10-CM | POA: Diagnosis present

## 2018-10-12 LAB — CBC WITH DIFFERENTIAL/PLATELET
Abs Immature Granulocytes: 0.06 10*3/uL (ref 0.00–0.07)
Basophils Absolute: 0.1 10*3/uL (ref 0.0–0.1)
Basophils Relative: 0 %
Eosinophils Absolute: 0.1 10*3/uL (ref 0.0–0.5)
Eosinophils Relative: 0 %
HCT: 37.1 % (ref 36.0–46.0)
Hemoglobin: 12.6 g/dL (ref 12.0–15.0)
Immature Granulocytes: 1 %
Lymphocytes Relative: 48 %
Lymphs Abs: 5.8 10*3/uL — ABNORMAL HIGH (ref 0.7–4.0)
MCH: 30.4 pg (ref 26.0–34.0)
MCHC: 34 g/dL (ref 30.0–36.0)
MCV: 89.6 fL (ref 80.0–100.0)
Monocytes Absolute: 1 10*3/uL (ref 0.1–1.0)
Monocytes Relative: 8 %
Neutro Abs: 5.1 10*3/uL (ref 1.7–7.7)
Neutrophils Relative %: 43 %
Platelets: 324 10*3/uL (ref 150–400)
RBC: 4.14 MIL/uL (ref 3.87–5.11)
RDW: 12.9 % (ref 11.5–15.5)
WBC: 12 10*3/uL — ABNORMAL HIGH (ref 4.0–10.5)
nRBC: 0 % (ref 0.0–0.2)

## 2018-10-12 LAB — BASIC METABOLIC PANEL
Anion gap: 15 (ref 5–15)
BUN: 6 mg/dL (ref 6–20)
CO2: 17 mmol/L — ABNORMAL LOW (ref 22–32)
Calcium: 9.3 mg/dL (ref 8.9–10.3)
Chloride: 104 mmol/L (ref 98–111)
Creatinine, Ser: 0.94 mg/dL (ref 0.44–1.00)
GFR calc Af Amer: >60 mL/min (ref >60)
GFR calc non Af Amer: >60 mL/min (ref >60)
Glucose, Bld: 165 mg/dL — ABNORMAL HIGH (ref 70–99)
Potassium: 2.7 mmol/L — CL (ref 3.5–5.1)
Sodium: 136 mmol/L (ref 135–145)

## 2018-10-12 LAB — TSH: TSH: 3.182 u[IU]/mL (ref 0.350–4.500)

## 2018-10-12 LAB — TROPONIN I: Troponin I: <0.03 ng/mL (ref <0.03)

## 2018-10-12 MED ORDER — METOPROLOL TARTRATE 5 MG/5ML IV SOLN
5.0000 mg | Freq: Once | INTRAVENOUS | Status: AC
  Administered 2018-10-12: 5 mg via INTRAVENOUS
  Filled 2018-10-12: qty 5

## 2018-10-12 MED ORDER — SODIUM CHLORIDE 0.9 % IV BOLUS
1000.0000 mL | Freq: Once | INTRAVENOUS | Status: AC
  Administered 2018-10-12: 1000 mL via INTRAVENOUS

## 2018-10-12 MED ORDER — METOPROLOL TARTRATE 25 MG PO TABS
25.0000 mg | ORAL_TABLET | Freq: Once | ORAL | Status: AC
  Administered 2018-10-12: 25 mg via ORAL
  Filled 2018-10-12: qty 1

## 2018-10-12 MED ORDER — POTASSIUM CHLORIDE CRYS ER 20 MEQ PO TBCR
40.0000 meq | EXTENDED_RELEASE_TABLET | Freq: Once | ORAL | Status: AC
  Administered 2018-10-12: 40 meq via ORAL
  Filled 2018-10-12: qty 2

## 2018-10-12 MED ORDER — POTASSIUM CHLORIDE 10 MEQ/100ML IV SOLN
10.0000 meq | Freq: Once | INTRAVENOUS | Status: AC
  Administered 2018-10-12: 10 meq via INTRAVENOUS
  Filled 2018-10-12: qty 100

## 2018-10-12 MED ORDER — POTASSIUM CHLORIDE CRYS ER 20 MEQ PO TBCR: 20.0000 meq | EXTENDED_RELEASE_TABLET | Freq: Two times a day (BID) | ORAL | 0 refills | Status: DC

## 2018-10-12 NOTE — ED Notes (Signed)
EDP notified of potassium of 2.7. EDP at bedside

## 2018-10-12 NOTE — ED Provider Notes (Signed)
[facility] EMERGENCY DEPARTMENT Provider Note   CSN: [id number] Arrival date & time: 10/12/18  1821    History   Chief Complaint No chief complaint on file.   HPI Angela [name] is a 33 y.o. female.     Patient is a 33 year old female with history of hypertension, GERD, and hypokalemia.  She presents today for evaluation of palpitations.  She states that her heart has been beating more rapidly since this afternoon.  Patient has had these episodes in the past, but is unsure as to why.  With one prior episode, she was found to have a low potassium level.  She denies to me that she is experiencing any chest pain, difficulty breathing, nausea, or diaphoresis.  She denies any recent exertional symptoms.  She denies any increased caffeine intake.  The history is provided by the patient.    Past Medical History:  Diagnosis Date  . Abnormal Pap smear AGE 33   COLPO LAST PAP 08/2011  . Allergy   . Anemia   . Complication of anesthesia    NAUSEA; VOMITING; ITCHING; DIFFICULTY BREATHING  . Genital herpes   . GERD (gastroesophageal reflux disease) AGE 31   tums prn  . History of chicken pox   . Hypertension   . Infection AGE 9   CHLAMYDIA; GONORRHEA  . 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 Infection    BV  . Migraines   . Ovarian cyst rupture   . PONV (postoperative nausea and vomiting)   . Preterm labor   . Urinary incontinence     Patient Active Problem List   Diagnosis Date Noted  . Helicobacter pylori (H. pylori) infection 07/06/2018  . Hiatal hernia 07/06/2018  . Hypokalemia 04/22/2018  . Pyrosis 04/15/2018  . Esophageal dysphagia 04/15/2018  . Sinus tachycardia 11/15/2017  . Mid-back pain, acute 10/08/2017  . 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 Palpitations 06/15/2017  . Migraine 08/11/2016  . Menorrhagia with regular cycle 05/14/2015  . HTN (hypertension), benign 05/14/2015  . Prediabetes 05/14/2015  . Overweight (BMI 25.0-29.9)  02/14/2015  . GERD (gastroesophageal reflux disease) 02/14/2015  . History of migraine 02/14/2015  . Urge incontinence 02/14/2015  . Genital herpes 02/14/2015  . S/P tubal ligation 11/16/2012    Past Surgical History:  Procedure Laterality Date  . CERVIX LESION DESTRUCTION    . CESAREAN SECTION  2005, 2010   x2  . CESAREAN SECTION WITH BILATERAL TUBAL LIGATION Bilateral 11/15/2012   Procedure: REPEAT CESAREAN SECTION WITH BILATERAL TUBAL LIGATION;  Surgeon: Kirkland Hun, MD;  Location: [facility];  Service: Obstetrics;  Laterality: Bilateral;  . COLPOSCOPY    . UNILATERAL SALPINGECTOMY Right 11/15/2012   Procedure: UNILATERAL SALPINGECTOMY;  Surgeon: Kirkland Hun, MD;  Location: [facility];  Service: Obstetrics;  Laterality: Right;  . WISDOM TOOTH EXTRACTION       OB History    Gravida  3   Para  3   Term  2   Preterm  1   AB      Living  3     SAB      TAB      Ectopic      Multiple      Live Births  3        Obstetric Comments  2005 HYPEREMESIS 2010 MECONIUM ASPIRATION         Home Medications    Prior to Admission medications   Medication Sig Start Date End Date  Taking? Authorizing Provider  amLODipine (NORVASC) 10 MG tablet TAKE 1 TABLET BY MOUTH  DAILY 08/29/18   Bedsole, Amy E, MD  cyclobenzaprine (FLEXERIL) 10 MG tablet TAKE 1 TABLET (10 MG TOTAL) BY MOUTH AT BEDTIME AS NEEDED FOR MUSCLE SPASMS. 10/05/17   Excell SeltzerBedsole, Amy E, MD  EMGALITY 120 MG/ML SOAJ INJECT 120MG  INTO THE SKIN EVERY 30 DAYS 09/20/18   Anson FretAhern, Antonia B, MD  losartan (COZAAR) 100 MG tablet TAKE 1 TABLET BY MOUTH  DAILY 09/19/18   Dunn, Tacey Ruizayna N, PA-C  metoprolol succinate (TOPROL-XL) 25 MG 24 hr tablet TAKE ONE-HALF TABLET BY  MOUTH DAILY 09/26/18   Lars MassonNelson, Katarina H, MD  pantoprazole (PROTONIX) 40 MG tablet Take 1 tablet (40 mg total) by mouth 2 (two) times daily before a meal. 06/01/18   Mansouraty, Netty StarringGabriel Jr., MD  potassium chloride (K-DUR) 10 MEQ tablet Take 1 tablet (10 mEq total) by  mouth daily. 04/14/18   Seawell, Jaimie A, DO  promethazine (PHENERGAN) 25 MG tablet Take 1 tablet (25 mg total) by mouth every 6 (six) hours as needed for nausea or vomiting. 09/27/18   Lomax, Amy, NP  tolterodine (DETROL LA) 4 MG 24 hr capsule TAKE 1 CAPSULE BY MOUTH  DAILY 03/08/18   Bedsole, Amy E, MD  valACYclovir (VALTREX) 1000 MG tablet Take 1 tablet (1,000 mg total) by mouth 2 (two) times daily. 11/30/16   Excell SeltzerBedsole, Amy E, MD    Family History Family History  Problem Relation Age of Onset  . Anesthesia problems Mother        NAUSEA AND VOMITING  . Thyroid disease Mother   . Fibromyalgia Mother   . Sarcoidosis Mother   . Depression Mother   . Other Mother        VARICOSE VEINS  . Anesthesia problems Other   . Diabetes Father   . Hypertension Sister   . Arthritis Maternal Grandmother   . Heart disease Maternal Grandmother   . Hypertension Maternal Grandmother   . Kidney disease Maternal Grandmother        FAILURE  . Prostate cancer Maternal Grandfather   . Depression Maternal Aunt   . Sarcoidosis Maternal Aunt   . Asthma Cousin   . Liver disease Cousin        requiring liver transplant- unknown as to what type of liver disease  . Colon cancer Neg Hx   . Esophageal cancer Neg Hx   . Pancreatic cancer Neg Hx   . Stomach cancer Neg Hx   . Inflammatory bowel disease Neg Hx   . Rectal cancer Neg Hx     Social History Social History   Tobacco Use  . Smoking status: Never Smoker  . Smokeless tobacco: Never Used  Substance Use Topics  . Alcohol use: Yes    Alcohol/week: 3.0 standard drinks    Types: 2 Glasses of wine, 1 Shots of liquor per week    Comment: weekends  . Drug use: No     Allergies   Amoxicillin and Dexilant [dexlansoprazole]   Review of Systems Review of Systems  All other systems reviewed and are negative.    Physical Exam Updated Vital Signs BP (!) 133/91   Pulse (!) 117   Temp 98.3 F (36.8 C) (Oral)   Resp (!) 24   Ht 5\' 3"  (1.6 m)    Wt 73.9 kg   SpO2 100%   BMI 28.87 kg/m   Physical Exam Vitals signs and nursing note reviewed.  Constitutional:  General: She is not in acute distress.    Appearance: She is well-developed. She is not diaphoretic.  HENT:     Head: Normocephalic and atraumatic.  Neck:     Musculoskeletal: Normal range of motion and neck supple.  Cardiovascular:     Rate and Rhythm: Regular rhythm. Tachycardia present.     Heart sounds: No murmur. No friction rub. No gallop.   Pulmonary:     Effort: Pulmonary effort is normal. No respiratory distress.     Breath sounds: Normal breath sounds. No wheezing.  Abdominal:     General: Bowel sounds are normal. There is no distension.     Palpations: Abdomen is soft.     Tenderness: There is no abdominal tenderness.  Musculoskeletal: Normal range of motion.        General: No swelling or tenderness.     Right lower leg: No edema.     Left lower leg: No edema.  Skin:    General: Skin is warm and dry.  Neurological:     Mental Status: She is alert and oriented to person, place, and time.      ED Treatments / Results  Labs (all labs ordered are listed, but only abnormal results are displayed) Labs Reviewed  BASIC METABOLIC PANEL - Abnormal; Notable for the following components:      Result Value   Potassium 2.7 (*)    CO2 17 (*)    Glucose, Bld 165 (*)    All other components within normal limits  CBC WITH DIFFERENTIAL/PLATELET - Abnormal; Notable for the following components:   WBC 12.0 (*)    Lymphs Abs 5.8 (*)    All other components within normal limits  TROPONIN I  TSH    EKG EKG Interpretation  Date/Time:  Wednesday October 12 2018 19:50:53 EDT Ventricular Rate:  117 PR Interval:    QRS Duration: 100 QT Interval:  320 QTC Calculation: 447 R Axis:   100 Text Interpretation:  Sinus tachycardia Borderline right axis deviation Borderline T abnormalities, diffuse leads Confirmed by Geoffery Lyons ([id number]) on 10/12/2018 8:42:45 PM    Radiology No results found.  Procedures Procedures (including critical care time)  Medications Ordered in ED Medications  potassium chloride 10 mEq in 100 mL IVPB (10 mEq Intravenous New Bag/Given 10/12/18 2114)  metoprolol tartrate (LOPRESSOR) injection 5 mg (5 mg Intravenous Given 10/12/18 1919)  potassium chloride SA (K-DUR) CR tablet 40 mEq (40 mEq Oral Given 10/12/18 2111)  sodium chloride 0.9 % bolus 1,000 mL (1,000 mLs Intravenous New Bag/Given 10/12/18 2115)     Initial Impression / Assessment and Plan / ED Course  I have reviewed the triage vital signs and the nursing notes.  Pertinent labs & imaging results that were available during my care of the patient were reviewed by me and considered in my medical decision making (see chart for details).  Patient presents here with complaints of tachycardia.  She has a history of similar episodes in the past, the etiology of which is uncertain.  At one point she had a low potassium and this is also low today.  It is 2.6.  Patient given IV and oral replacement along with IV and oral metoprolol.  Her heart rate is now 102.  Laboratory studies are otherwise unremarkable including CBC, troponin, TSH, and remainder of her electrolytes.  She will be discharged with potassium and an increased dose of her metoprolol.  She is to follow-up with her cardiologist next week, and return in  the meantime if symptoms worsen or change.  I have also considered pulmonary embolism, however the patient is not having any chest pain or shortness of breath.  Her oxygen saturations are 100% and a doubt this to be the case.  Final Clinical Impressions(s) / ED Diagnoses   Final diagnoses:  None    ED Discharge Orders    None       Geoffery Lyons, MD 10/12/18 2229

## 2018-10-12 NOTE — ED Notes (Signed)
ED Provider at bedside. 

## 2018-10-12 NOTE — Discharge Instructions (Addendum)
Increase your dose of metoprolol to 50 mg daily.  Begin taking potassium as prescribed today.  Follow-up with your primary doctor or cardiologist next week for a recheck, and return to the ER in the meantime if symptoms significantly worsen or change.

## 2018-10-12 NOTE — ED Triage Notes (Signed)
Presents with rapid HR, left work and had sudden onset.   Takes metoprolol and has fell well x 1 month.  Some noted chest wall pain, no SOB, N/V or dipahoresis.  Alert and appropriate.

## 2018-10-13 ENCOUNTER — Telehealth: Payer: Self-pay

## 2018-10-13 NOTE — Telephone Encounter (Signed)
Attempted to reach patient on mobile phone to schedule ED f/u appt. Attempt unsuccessful. Left message with contact info.  If patient returns call, please schedule ED f/u appt with PCP.

## 2018-10-18 ENCOUNTER — Encounter: Payer: Self-pay | Admitting: Family Medicine

## 2018-10-18 ENCOUNTER — Ambulatory Visit (INDEPENDENT_AMBULATORY_CARE_PROVIDER_SITE_OTHER): Payer: Managed Care, Other (non HMO) | Admitting: Family Medicine

## 2018-10-18 VITALS — BP 121/80 | HR 85 | Wt 162.1 lb

## 2018-10-18 DIAGNOSIS — E876 Hypokalemia: Secondary | ICD-10-CM | POA: Diagnosis not present

## 2018-10-18 DIAGNOSIS — D72829 Elevated white blood cell count, unspecified: Secondary | ICD-10-CM

## 2018-10-18 DIAGNOSIS — I10 Essential (primary) hypertension: Secondary | ICD-10-CM | POA: Diagnosis not present

## 2018-10-18 DIAGNOSIS — R000 Tachycardia, unspecified: Secondary | ICD-10-CM | POA: Diagnosis not present

## 2018-10-18 NOTE — Progress Notes (Signed)
VIRTUAL VISIT Due to national recommendations of social distancing due to [place] 19, a virtual visit is felt to be most appropriate for this patient at this time.   I connected with the patient on 10/18/18 at  3:40 PM EDT by virtual telehealth platform and verified that I am speaking with the correct person using two identifiers.   I discussed the limitations, risks, security and privacy concerns of performing an evaluation and management service by  virtual telehealth platform and the availability of in person appointments. I also discussed with the patient that there may be a patient responsible charge related to this service. The patient expressed understanding and agreed to proceed.  Patient location: Home Provider Location: [facility] Participants: Eliezer Lofts and Charmian Muff   Chief Complaint  Patient presents with  . Follow-up    ER VISIT    History of Present Illness: 33 year old female with history of sinus tachycardia, HTN, GERD presents for ER follow up. She was seen in ER on 10/12/2018 for tachycardia  [id number] Associated with chest wall pain, no SOB  found to have low potassium at 2.7  EKG unremarkable except for sinus tachy  TSH, troponin i, cbc, bmet otherwise normal Patient given IV and oral replacement along with IV and oral metoprolol... HR decreased to 102   BP has been good on amlodipine, losartan and metoprolol.  No further episodes.. now taking metoprolol  1 whole 25 mg tablet daily. BP 120/80, HR 85  first few days felt dizzy, none further.   eating healthy.Marland Kitchen leafy green veggies. Increased water. She has started on KDUR 20 mEQ 2 times daily x 10 days  COVID 19 screen No recent travel or known exposure to [place] The patient denies respiratory symptoms of COVID 19 at this time.  The importance of social distancing was discussed today.   Review of Systems  Constitutional: Negative for chills and fever.  HENT: Negative for congestion and  ear pain.   Eyes: Negative for pain and redness.  Respiratory: Negative for cough and shortness of breath.   Cardiovascular: Negative for chest pain, palpitations and leg swelling.  Gastrointestinal: Negative for abdominal pain, blood in stool, constipation, diarrhea, nausea and vomiting.  Genitourinary: Negative for dysuria.  Musculoskeletal: Negative for falls and myalgias.  Skin: Negative for rash.  Neurological: Negative for dizziness.  Psychiatric/Behavioral: Negative for depression. The patient is not nervous/anxious.       Past Medical History:  Diagnosis Date  . Abnormal Pap smear AGE 15   COLPO LAST PAP 08/2011  . Allergy   . Anemia   . Complication of anesthesia    NAUSEA; VOMITING; ITCHING; DIFFICULTY BREATHING  . Genital herpes   . GERD (gastroesophageal reflux disease) AGE 6   tums prn  . History of chicken pox   . Hypertension   . Infection AGE 65   CHLAMYDIA; GONORRHEA  . 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 Infection    BV  . Migraines   . Ovarian cyst rupture   . PONV (postoperative nausea and vomiting)   . Preterm labor   . Urinary incontinence     reports that she has never smoked. She has never used smokeless tobacco. She reports current alcohol use of about 3.0 standard drinks of alcohol per week. She reports that she does not use drugs.   Current Outpatient Medications:  .  amLODipine (NORVASC) 10 MG tablet, TAKE 1 TABLET BY MOUTH  DAILY, Disp: 90 tablet, Rfl:  1 .  cyclobenzaprine (FLEXERIL) 10 MG tablet, TAKE 1 TABLET (10 MG TOTAL) BY MOUTH AT BEDTIME AS NEEDED FOR MUSCLE SPASMS., Disp: 15 tablet, Rfl: 0 .  EMGALITY 120 MG/ML SOAJ, INJECT 120MG  INTO THE SKIN EVERY 30 DAYS, Disp: 1 pen, Rfl: 11 .  losartan (COZAAR) 100 MG tablet, TAKE 1 TABLET BY MOUTH  DAILY, Disp: 90 tablet, Rfl: 1 .  metoprolol succinate (TOPROL-XL) 25 MG 24 hr tablet, TAKE ONE-HALF TABLET BY  MOUTH DAILY (Patient taking differently: 25 mg. ), Disp: 45 tablet, Rfl: 2 .  pantoprazole  (PROTONIX) 40 MG tablet, Take 1 tablet (40 mg total) by mouth 2 (two) times daily before a meal., Disp: 180 tablet, Rfl: 3 .  potassium chloride SA (K-DUR) 20 MEQ tablet, Take 1 tablet (20 mEq total) by mouth 2 (two) times daily., Disp: 20 tablet, Rfl: 0 .  promethazine (PHENERGAN) 25 MG tablet, Take 1 tablet (25 mg total) by mouth every 6 (six) hours as needed for nausea or vomiting., Disp: 30 tablet, Rfl: 0 .  tolterodine (DETROL LA) 4 MG 24 hr capsule, TAKE 1 CAPSULE BY MOUTH  DAILY, Disp: 90 capsule, Rfl: 3 .  valACYclovir (VALTREX) 1000 MG tablet, Take 1 tablet (1,000 mg total) by mouth 2 (two) times daily., Disp: 20 tablet, Rfl: 0  Current Facility-Administered Medications:  .  0.9 %  sodium chloride infusion, 500 mL, Intravenous, Once, Mansouraty, Netty StarringGabriel Jr., MD   Observations/Objective: Blood pressure 121/80, pulse 85, weight 162 lb 1 oz (73.5 kg).  Physical Exam  Physical Exam Constitutional:      General: The patient is not in acute distress. Pulmonary:     Effort: Pulmonary effort is normal. No respiratory distress.  Neurological:     Mental Status: The patient is alert and oriented to person, place, and time.  Psychiatric:        Mood and Affect: Mood normal.        Behavior: Behavior normal.   Assessment and Plan   HTN (hypertension), benign Well controlled. Continue current medication.   Hypokalemia  Will re-eval for resolution on high potassium diet.   Leukocytosis Unclear cause.Marland Kitchen. re-eval  Sinus tachycardia On bblocker. Followed by cardiology   I discussed the assessment and treatment plan with the patient. The patient was provided an opportunity to ask questions and all were answered. The patient agreed with the plan and demonstrated an understanding of the instructions.   The patient was advised to call back or seek an in-person evaluation if the symptoms worsen or if the condition fails to improve as anticipated.     Kerby NoraAmy Darriana Deboy, MD

## 2018-10-27 ENCOUNTER — Telehealth: Payer: Self-pay | Admitting: Family Medicine

## 2018-10-27 NOTE — Progress Notes (Signed)
Appointment 6/19

## 2018-10-27 NOTE — Telephone Encounter (Signed)
-----   Message from Ellamae Sia sent at 10/26/2018  3:22 PM EDT ----- Regarding: lab orders for Friday,  6.19.20 Lab orders

## 2018-10-28 ENCOUNTER — Other Ambulatory Visit (INDEPENDENT_AMBULATORY_CARE_PROVIDER_SITE_OTHER): Payer: Managed Care, Other (non HMO)

## 2018-10-28 DIAGNOSIS — E876 Hypokalemia: Secondary | ICD-10-CM

## 2018-10-28 DIAGNOSIS — D72829 Elevated white blood cell count, unspecified: Secondary | ICD-10-CM

## 2018-10-29 LAB — CBC WITH DIFFERENTIAL/PLATELET
Basophils Absolute: 0 10*3/uL (ref 0.0–0.2)
Basos: 1 %
EOS (ABSOLUTE): 0.1 10*3/uL (ref 0.0–0.4)
Eos: 1 %
Hematocrit: 36.8 % (ref 34.0–46.6)
Hemoglobin: 12.1 g/dL (ref 11.1–15.9)
Immature Grans (Abs): 0.1 10*3/uL (ref 0.0–0.1)
Immature Granulocytes: 1 %
Lymphocytes Absolute: 2.7 10*3/uL (ref 0.7–3.1)
Lymphs: 41 %
MCH: 30.5 pg (ref 26.6–33.0)
MCHC: 32.9 g/dL (ref 31.5–35.7)
MCV: 93 fL (ref 79–97)
Monocytes Absolute: 0.8 10*3/uL (ref 0.1–0.9)
Monocytes: 12 %
Neutrophils Absolute: 2.9 10*3/uL (ref 1.4–7.0)
Neutrophils: 44 %
Platelets: 356 10*3/uL (ref 150–450)
RBC: 3.97 x10E6/uL (ref 3.77–5.28)
RDW: 12.7 % (ref 11.7–15.4)
WBC: 6.5 10*3/uL (ref 3.4–10.8)

## 2018-10-29 LAB — BASIC METABOLIC PANEL
BUN/Creatinine Ratio: 13 (ref 9–23)
BUN: 11 mg/dL (ref 6–20)
CO2: 24 mmol/L (ref 20–29)
Calcium: 9.6 mg/dL (ref 8.7–10.2)
Chloride: 100 mmol/L (ref 96–106)
Creatinine, Ser: 0.86 mg/dL (ref 0.57–1.00)
GFR calc Af Amer: 102 mL/min/{1.73_m2} (ref >59)
GFR calc non Af Amer: 88 mL/min/{1.73_m2} (ref >59)
Glucose: 98 mg/dL (ref 65–99)
Potassium: 3.7 mmol/L (ref 3.5–5.2)
Sodium: 138 mmol/L (ref 134–144)

## 2018-11-03 DIAGNOSIS — Z0279 Encounter for issue of other medical certificate: Secondary | ICD-10-CM

## 2018-11-07 NOTE — Telephone Encounter (Signed)
Best number [phone]  Pt called back regarding my chart message

## 2018-11-08 ENCOUNTER — Telehealth: Payer: Self-pay | Admitting: Family Medicine

## 2018-11-08 NOTE — Telephone Encounter (Signed)
Fmla paperwork in dr Diona Browner in box

## 2018-11-08 NOTE — Telephone Encounter (Signed)
compelted and in my outbox

## 2018-11-09 NOTE — Telephone Encounter (Signed)
Paperwork faxed °

## 2018-11-10 ENCOUNTER — Other Ambulatory Visit: Payer: Self-pay | Admitting: Family Medicine

## 2018-11-10 DIAGNOSIS — E876 Hypokalemia: Secondary | ICD-10-CM

## 2018-11-10 MED ORDER — POTASSIUM CHLORIDE CRYS ER 20 MEQ PO TBCR: 20.0000 meq | EXTENDED_RELEASE_TABLET | Freq: Every day | ORAL | 0 refills | Status: DC

## 2018-11-10 NOTE — Progress Notes (Signed)
Discard printed prescription. Sent correctly through epic.

## 2018-11-16 ENCOUNTER — Other Ambulatory Visit (INDEPENDENT_AMBULATORY_CARE_PROVIDER_SITE_OTHER): Payer: Managed Care, Other (non HMO)

## 2018-11-16 DIAGNOSIS — E876 Hypokalemia: Secondary | ICD-10-CM | POA: Diagnosis not present

## 2018-11-16 NOTE — Addendum Note (Signed)
Addended by: Ellamae Sia on: 11/16/2018 12:07 PM   Modules accepted: Orders

## 2018-11-17 LAB — BASIC METABOLIC PANEL
BUN/Creatinine Ratio: 14 (ref 9–23)
BUN: 10 mg/dL (ref 6–20)
CO2: 21 mmol/L (ref 20–29)
Calcium: 9.5 mg/dL (ref 8.7–10.2)
Chloride: 101 mmol/L (ref 96–106)
Creatinine, Ser: 0.72 mg/dL (ref 0.57–1.00)
GFR calc Af Amer: 126 mL/min/{1.73_m2} (ref >59)
GFR calc non Af Amer: 110 mL/min/{1.73_m2} (ref >59)
Glucose: 92 mg/dL (ref 65–99)
Potassium: 4.5 mmol/L (ref 3.5–5.2)
Sodium: 138 mmol/L (ref 134–144)

## 2018-11-18 NOTE — Telephone Encounter (Signed)
Pt aware  °Copy for pt °Copy for scan °

## 2018-12-03 ENCOUNTER — Other Ambulatory Visit: Payer: Self-pay | Admitting: Family Medicine

## 2018-12-12 ENCOUNTER — Other Ambulatory Visit: Payer: Self-pay | Admitting: *Deleted

## 2018-12-12 MED ORDER — POTASSIUM CHLORIDE CRYS ER 20 MEQ PO TBCR: 20.0000 meq | EXTENDED_RELEASE_TABLET | ORAL | 0 refills | Status: DC

## 2018-12-14 ENCOUNTER — Encounter: Payer: Self-pay | Admitting: Family Medicine

## 2018-12-14 DIAGNOSIS — D72829 Elevated white blood cell count, unspecified: Secondary | ICD-10-CM | POA: Insufficient documentation

## 2018-12-14 NOTE — Assessment & Plan Note (Signed)
Will re-eval for resolution on high potassium diet.

## 2018-12-14 NOTE — Assessment & Plan Note (Signed)
Well controlled. Continue current medication.  

## 2018-12-14 NOTE — Assessment & Plan Note (Signed)
Unclear cause.Marland Kitchen re-eval

## 2018-12-14 NOTE — Assessment & Plan Note (Signed)
On bblocker. Followed by cardiology

## 2018-12-15 ENCOUNTER — Telehealth: Payer: Self-pay | Admitting: Family Medicine

## 2018-12-15 DIAGNOSIS — E876 Hypokalemia: Secondary | ICD-10-CM

## 2018-12-15 NOTE — Telephone Encounter (Signed)
-----   Message from Cloyd Stagers, RT sent at 12/14/2018  1:22 PM EDT ----- Regarding: Lab Orders for Friday 8.7.2020 Please place lab orders for Friday 8.7.2020, office visit for physical on Tuesday 9.22.2020.  Pt also has a lab appt on Friday 9.18.2020?  Pt had a BMET on 7.8.2020...   Thank you, Dyke Maes RT(R)

## 2018-12-16 ENCOUNTER — Other Ambulatory Visit: Payer: Self-pay

## 2018-12-16 ENCOUNTER — Other Ambulatory Visit (INDEPENDENT_AMBULATORY_CARE_PROVIDER_SITE_OTHER): Payer: Managed Care, Other (non HMO)

## 2018-12-16 DIAGNOSIS — E876 Hypokalemia: Secondary | ICD-10-CM | POA: Diagnosis not present

## 2018-12-16 NOTE — Addendum Note (Signed)
Addended by: Cloyd Stagers on: 12/16/2018 08:39 AM   Modules accepted: Orders

## 2018-12-17 LAB — BASIC METABOLIC PANEL
BUN/Creatinine Ratio: 14 (ref 9–23)
BUN: 11 mg/dL (ref 6–20)
CO2: 19 mmol/L — ABNORMAL LOW (ref 20–29)
Calcium: 9.3 mg/dL (ref 8.7–10.2)
Chloride: 101 mmol/L (ref 96–106)
Creatinine, Ser: 0.81 mg/dL (ref 0.57–1.00)
GFR calc Af Amer: 110 mL/min/{1.73_m2} (ref >59)
GFR calc non Af Amer: 95 mL/min/{1.73_m2} (ref >59)
Glucose: 106 mg/dL — ABNORMAL HIGH (ref 65–99)
Potassium: 3.5 mmol/L (ref 3.5–5.2)
Sodium: 139 mmol/L (ref 134–144)

## 2018-12-17 LAB — MAGNESIUM: Magnesium: 1.9 mg/dL (ref 1.6–2.3)

## 2018-12-18 ENCOUNTER — Other Ambulatory Visit: Payer: Self-pay | Admitting: Family Medicine

## 2018-12-19 ENCOUNTER — Telehealth: Payer: Self-pay | Admitting: *Deleted

## 2018-12-19 NOTE — Telephone Encounter (Addendum)
CMM initiated for Emgality 120mg  /ml inject to skin every 30 days.  KEY # [id number].  Upload of last ofv note.  Tried imitrex, motrin, tylenol, topiramate, flexeril, labetalol. ([facility]-worked would not approve).  Determination 72 hours.

## 2018-12-20 NOTE — Telephone Encounter (Signed)
I called [facility] about denial.  I spoke to [employer].  Pts ID [id number].  I answered additional questions needed. Will send to pharmacist for determination.

## 2018-12-21 NOTE — Telephone Encounter (Signed)
Received denial PA [id number] about emgality.   Stating did not meet clinical requirements.  1) treatment working for condition.  Headaches less often, and intensity not as strong.  2) using less ST migraine drugs (NSAIDSm Triptans).  3) provider prescribing is neurologist  Or pain management.  I spent 28 minutes on the phone speaking with Nabojsa re: the PA denial.  Will be sending to clinical pharm for review.  These are questions that were answered multiple times.

## 2018-12-21 NOTE — Telephone Encounter (Signed)
received by fax, approval for emgality 120mg  /ml for 25months thru 12-21-19. PA # [id number].  [facility] [phone]. Fax confirmation to [phone].

## 2018-12-23 ENCOUNTER — Other Ambulatory Visit: Payer: Self-pay | Admitting: Family Medicine

## 2018-12-23 MED ORDER — METOPROLOL SUCCINATE ER 25 MG PO TB24: 25.0000 mg | ORAL_TABLET | Freq: Every day | ORAL | 0 refills | Status: DC

## 2018-12-23 MED ORDER — METOPROLOL SUCCINATE ER 25 MG PO TB24: 25.0000 mg | ORAL_TABLET | Freq: Every day | ORAL | 1 refills | Status: DC

## 2018-12-23 NOTE — Telephone Encounter (Signed)
Refills sent as requested.  I did send a [facility] day supply to [facility] [address] to get patient through until she receives her mail order prescription.  Ms. Ek aware.

## 2018-12-23 NOTE — Telephone Encounter (Signed)
Patient is needing her metoprolol sent into the pharmacy  She stated that you have changed her to taking on whole tablet daily and she is needing a new update prescription sent in to her pharmacy   Patient stated that she is completely out of her medication   [place]

## 2019-01-02 ENCOUNTER — Other Ambulatory Visit: Payer: Self-pay | Admitting: Family Medicine

## 2019-01-10 ENCOUNTER — Other Ambulatory Visit: Payer: Self-pay | Admitting: Physician Assistant

## 2019-01-14 ENCOUNTER — Telehealth: Payer: Self-pay | Admitting: Family Medicine

## 2019-01-14 DIAGNOSIS — R7303 Prediabetes: Secondary | ICD-10-CM

## 2019-01-14 DIAGNOSIS — I10 Essential (primary) hypertension: Secondary | ICD-10-CM

## 2019-01-14 DIAGNOSIS — E876 Hypokalemia: Secondary | ICD-10-CM

## 2019-01-14 NOTE — Telephone Encounter (Signed)
-----   Message from Ellamae Sia sent at 01/10/2019 10:38 AM EDT ----- Regarding: Lab orders for Tuesday, 9.8.20 Patient is scheduled for CPX labs, please order future labs, Thanks , Karna Christmas

## 2019-01-15 ENCOUNTER — Other Ambulatory Visit: Payer: Self-pay | Admitting: Family Medicine

## 2019-01-17 ENCOUNTER — Other Ambulatory Visit (INDEPENDENT_AMBULATORY_CARE_PROVIDER_SITE_OTHER): Payer: Managed Care, Other (non HMO)

## 2019-01-17 ENCOUNTER — Other Ambulatory Visit: Payer: Self-pay

## 2019-01-17 DIAGNOSIS — E876 Hypokalemia: Secondary | ICD-10-CM

## 2019-01-17 DIAGNOSIS — R7303 Prediabetes: Secondary | ICD-10-CM

## 2019-01-17 DIAGNOSIS — I10 Essential (primary) hypertension: Secondary | ICD-10-CM

## 2019-01-17 NOTE — Addendum Note (Signed)
Addended by: Cloyd Stagers on: 01/17/2019 08:26 AM   Modules accepted: Orders

## 2019-01-18 LAB — COMPREHENSIVE METABOLIC PANEL
ALT: 36 IU/L — ABNORMAL HIGH (ref 0–32)
AST: 49 IU/L — ABNORMAL HIGH (ref 0–40)
Albumin/Globulin Ratio: 2 (ref 1.2–2.2)
Albumin: 4.5 g/dL (ref 3.8–4.8)
Alkaline Phosphatase: 58 IU/L (ref 39–117)
BUN/Creatinine Ratio: 7 — ABNORMAL LOW (ref 9–23)
BUN: 6 mg/dL (ref 6–20)
Bilirubin Total: 0.7 mg/dL (ref 0.0–1.2)
CO2: 22 mmol/L (ref 20–29)
Calcium: 9.3 mg/dL (ref 8.7–10.2)
Chloride: 99 mmol/L (ref 96–106)
Creatinine, Ser: 0.85 mg/dL (ref 0.57–1.00)
GFR calc Af Amer: 103 mL/min/{1.73_m2} (ref >59)
GFR calc non Af Amer: 90 mL/min/{1.73_m2} (ref >59)
Globulin, Total: 2.3 g/dL (ref 1.5–4.5)
Glucose: 107 mg/dL — ABNORMAL HIGH (ref 65–99)
Potassium: 3.9 mmol/L (ref 3.5–5.2)
Sodium: 137 mmol/L (ref 134–144)
Total Protein: 6.8 g/dL (ref 6.0–8.5)

## 2019-01-18 LAB — LIPID PANEL
Chol/HDL Ratio: 1.8 ratio (ref 0.0–4.4)
Cholesterol, Total: 134 mg/dL (ref 100–199)
HDL: 74 mg/dL (ref >39)
LDL Chol Calc (NIH): 39 mg/dL (ref 0–99)
Triglycerides: 119 mg/dL (ref 0–149)
VLDL Cholesterol Cal: 21 mg/dL (ref 5–40)

## 2019-01-18 LAB — HEMOGLOBIN A1C
Est. average glucose Bld gHb Est-mCnc: 105 mg/dL
Hgb A1c MFr Bld: 5.3 % (ref 4.8–5.6)

## 2019-01-19 NOTE — Progress Notes (Signed)
No critical labs need to be addressed urgently. We will discuss labs in detail at upcoming office visit.   

## 2019-01-27 ENCOUNTER — Other Ambulatory Visit: Payer: Managed Care, Other (non HMO)

## 2019-01-30 ENCOUNTER — Other Ambulatory Visit: Payer: Self-pay | Admitting: Family Medicine

## 2019-01-31 ENCOUNTER — Encounter: Payer: Self-pay | Admitting: Family Medicine

## 2019-01-31 ENCOUNTER — Ambulatory Visit (INDEPENDENT_AMBULATORY_CARE_PROVIDER_SITE_OTHER): Payer: Managed Care, Other (non HMO) | Admitting: Family Medicine

## 2019-01-31 ENCOUNTER — Other Ambulatory Visit: Payer: Self-pay

## 2019-01-31 VITALS — BP 130/78 | HR 132 | Temp 98.3°F | Ht 63.25 in | Wt 159.8 lb

## 2019-01-31 DIAGNOSIS — I10 Essential (primary) hypertension: Secondary | ICD-10-CM | POA: Diagnosis not present

## 2019-01-31 DIAGNOSIS — R7303 Prediabetes: Secondary | ICD-10-CM

## 2019-01-31 DIAGNOSIS — R945 Abnormal results of liver function studies: Secondary | ICD-10-CM | POA: Diagnosis not present

## 2019-01-31 DIAGNOSIS — Z23 Encounter for immunization: Secondary | ICD-10-CM

## 2019-01-31 DIAGNOSIS — Z0000 Encounter for general adult medical examination without abnormal findings: Secondary | ICD-10-CM | POA: Diagnosis not present

## 2019-01-31 DIAGNOSIS — R7989 Other specified abnormal findings of blood chemistry: Secondary | ICD-10-CM

## 2019-01-31 MED ORDER — POTASSIUM CHLORIDE CRYS ER 20 MEQ PO TBCR: EXTENDED_RELEASE_TABLET | ORAL | 3 refills | Status: DC

## 2019-01-31 NOTE — Patient Instructions (Addendum)
Stop alcohol , work on low fat diet. Return for recheck in 3-6 months of liver function tests.

## 2019-01-31 NOTE — Progress Notes (Signed)
Chief Complaint  Patient presents with  . Annual Exam    History of Present Illness: HPI   The patient is here for annual wellness exam and preventative care.    Hypertension:   Good control on losartan, amlodipine, metoprolol   BP Readings from Last 3 Encounters:  01/31/19 130/78  10/18/18 121/80  10/12/18 120/83   No recent  palpitations           Using medication without problems or lightheadedness: none Chest pain with exertion:none Edema:none Short of breath:none Average home BPs: Other issues:  Wt Readings from Last 3 Encounters:  01/31/19 159 lb 12 oz (72.5 kg)  10/18/18 162 lb 1 oz (73.5 kg)  10/12/18 163 lb (73.9 kg)   Slight elevated LFTs:  No tylenol, ETOH 2 oz liquor 5 days a week, no family history of liver issues.  Prediabetes .Marland Kitchen Resolved.  Lab Results  Component Value Date   HGBA1C 5.3 01/17/2019   Reviewed labs in detail.  Diet: eating healthy diet  Exercise: daily walking  COVID 19 screen No recent travel or known exposure to COVID19 The patient denies respiratory symptoms of COVID 19 at this time.  The importance of social distancing was discussed today.   ROS    Past Medical History:  Diagnosis Date  . Abnormal Pap smear AGE 33   COLPO LAST PAP 08/2011  . Allergy   . Anemia   . Complication of anesthesia    NAUSEA; VOMITING; ITCHING; DIFFICULTY BREATHING  . Genital herpes   . GERD (gastroesophageal reflux disease) AGE 79   tums prn  . History of chicken pox   . Hypertension   . Infection AGE 66   CHLAMYDIA; GONORRHEA  . 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 Infection    BV  . Migraines   . Ovarian cyst rupture   . PONV (postoperative nausea and vomiting)   . Preterm labor   . Urinary incontinence     reports that she has never smoked. She has never used smokeless tobacco. She reports current alcohol use of about 3.0 standard drinks of alcohol per week. She reports that she does not use drugs.   Current Outpatient Medications:  .   amLODipine (NORVASC) 10 MG tablet, TAKE 1 TABLET BY MOUTH  DAILY, Disp: 90 tablet, Rfl: 3 .  cyclobenzaprine (FLEXERIL) 10 MG tablet, TAKE 1 TABLET (10 MG TOTAL) BY MOUTH AT BEDTIME AS NEEDED FOR MUSCLE SPASMS., Disp: 15 tablet, Rfl: 0 .  EMGALITY 120 MG/ML SOAJ, INJECT 120MG  INTO THE SKIN EVERY 30 DAYS, Disp: 1 pen, Rfl: 11 .  losartan (COZAAR) 100 MG tablet, Take 100 mg by mouth daily., Disp: , Rfl:  .  metoprolol succinate (TOPROL-XL) 25 MG 24 hr tablet, TAKE 1 TABLET BY MOUTH EVERY DAY, Disp: 90 tablet, Rfl: 1 .  pantoprazole (PROTONIX) 40 MG tablet, Take 1 tablet (40 mg total) by mouth 2 (two) times daily before a meal., Disp: 180 tablet, Rfl: 3 .  potassium chloride SA (K-DUR) 20 MEQ tablet, Take 20 mEq by mouth. Every, Su,M,W, F, Disp: , Rfl:  .  promethazine (PHENERGAN) 25 MG tablet, Take 1 tablet (25 mg total) by mouth every 6 (six) hours as needed for nausea or vomiting., Disp: 30 tablet, Rfl: 0 .  tolterodine (DETROL LA) 4 MG 24 hr capsule, TAKE 1 CAPSULE BY MOUTH  DAILY, Disp: 90 capsule, Rfl: 3 .  valACYclovir (VALTREX) 1000 MG tablet, Take 1 tablet (1,000 mg total) by mouth 2 (  two) times daily., Disp: 20 tablet, Rfl: 0  Current Facility-Administered Medications:  .  0.9 %  sodium chloride infusion, 500 mL, Intravenous, Once, Mansouraty, Telford Nab., MD   Observations/Objective: Blood pressure 130/78, pulse (!) 132, temperature 98.3 F (36.8 C), temperature source Temporal, height 5' 3.25" (1.607 m), weight 159 lb 12 oz (72.5 kg), SpO2 99 %.  Physical Exam Constitutional:      General: She is not in acute distress.    Appearance: Normal appearance. She is well-developed. She is not ill-appearing or toxic-appearing.  HENT:     Head: Normocephalic.     Right Ear: Hearing, tympanic membrane, ear canal and external ear normal. Tympanic membrane is not erythematous, retracted or bulging.     Left Ear: Hearing, tympanic membrane, ear canal and external ear normal. Tympanic membrane is  not erythematous, retracted or bulging.     Nose: Nose normal. No mucosal edema or rhinorrhea.     Right Sinus: No maxillary sinus tenderness or frontal sinus tenderness.     Left Sinus: No maxillary sinus tenderness or frontal sinus tenderness.     Mouth/Throat:     Pharynx: Uvula midline.  Eyes:     General: Lids are normal. Lids are everted, no foreign bodies appreciated.     Conjunctiva/sclera: Conjunctivae normal.     Pupils: Pupils are equal, round, and reactive to light.  Neck:     Musculoskeletal: Normal range of motion and neck supple.     Thyroid: No thyroid mass or thyromegaly.     Vascular: No carotid bruit.     Trachea: Trachea normal.  Cardiovascular:     Rate and Rhythm: Normal rate and regular rhythm.     Pulses: Normal pulses.     Heart sounds: Normal heart sounds, S1 normal and S2 normal. No murmur. No friction rub. No gallop.   Pulmonary:     Effort: Pulmonary effort is normal. No tachypnea or respiratory distress.     Breath sounds: Normal breath sounds. No decreased breath sounds, wheezing, rhonchi or rales.  Abdominal:     General: Bowel sounds are normal. There is no distension or abdominal bruit.     Palpations: Abdomen is soft. There is no fluid wave or mass.     Tenderness: There is no abdominal tenderness. There is no guarding or rebound.     Hernia: No hernia is present.  Lymphadenopathy:     Cervical: No cervical adenopathy.  Skin:    General: Skin is warm and dry.     Findings: No rash.  Neurological:     Mental Status: She is alert.     Cranial Nerves: No cranial nerve deficit.     Sensory: No sensory deficit.  Psychiatric:        Mood and Affect: Mood is not anxious or depressed.        Speech: Speech normal.        Behavior: Behavior normal. Behavior is cooperative.        Thought Content: Thought content normal.        Judgment: Judgment normal.      Assessment and Plan   The patient's preventative maintenance and recommended screening  tests for an annual wellness exam were reviewed in full today. Brought up to date unless services declined.  Counselled on the importance of diet, exercise, and its role in overall health and mortality. The patient's FH and SH was reviewed, including their home life, tobacco status, and drug and alcohol status.  Vaccines: Given with flu and  uptodateTdap Pelvic: DVE/pap;last pap 2017 nml, no high risk HPV, repeat in 5 years. No early family history of colon, uterine , ovarianor breast cancer. Nonsmoker HIV/STD: refused.  HTN (hypertension), benign Well controlled. Continue current medication.   Prediabetes Resolved. Encouraged exercise, weight loss, healthy eating habits.   Elevated liver function tests  Stop ETOH, no tylenol, no family history, doubt fatty liver given low fat diet and weight loss.   re-eval in 3 months. If persistently elevated consider hepatitis panel and [place].    [name], MD

## 2019-02-01 DIAGNOSIS — R7989 Other specified abnormal findings of blood chemistry: Secondary | ICD-10-CM | POA: Insufficient documentation

## 2019-02-01 NOTE — Assessment & Plan Note (Signed)
Stop ETOH, no tylenol, no family history, doubt fatty liver given low fat diet and weight loss.   re-eval in 3 months. If persistently elevated consider hepatitis panel and [place].

## 2019-02-01 NOTE — Assessment & Plan Note (Signed)
Well controlled. Continue current medication.  

## 2019-02-01 NOTE — Assessment & Plan Note (Signed)
Resolved. Encouraged exercise, weight loss, healthy eating habits.  

## 2019-02-02 ENCOUNTER — Telehealth: Payer: Self-pay | Admitting: Family Medicine

## 2019-02-02 NOTE — Telephone Encounter (Signed)
fmla paperwork in dr bedsole's in box for review and signature

## 2019-02-03 DIAGNOSIS — Z0279 Encounter for issue of other medical certificate: Secondary | ICD-10-CM

## 2019-02-09 NOTE — Telephone Encounter (Signed)
Paperwork faxed 9/28 Pt aware Copy for scan Copy for billing Copy for pt

## 2019-03-06 ENCOUNTER — Telehealth: Payer: Self-pay

## 2019-03-06 DIAGNOSIS — A048 Other specified bacterial intestinal infections: Secondary | ICD-10-CM

## 2019-03-06 NOTE — Telephone Encounter (Signed)
The pt has been advised to come in for stool testing after stopping PPI for 2 weeks. Order in [facility]

## 2019-03-06 NOTE — Telephone Encounter (Signed)
Patty, Please reach out to the patient and let her know lets go ahead and move forward with getting her retested for H. pylori. After 1.5 to 2 weeks off of PPI therapy she should come in for a stool H. pylori antigen. After the stool study has been given she can restart PPI at her current dose. Thank you. GM

## 2019-03-06 NOTE — Telephone Encounter (Signed)
Angela [name], Please reach out to the patient and let her know lets go ahead and move forward with getting her retested for H. pylori. After 1.5 to 2 weeks off of PPI therapy she should come in for a stool H. pylori antigen. After the stool study has been given she can restart PPI at her current dose. Thank you. GM 

## 2019-03-10 ENCOUNTER — Other Ambulatory Visit: Payer: Self-pay

## 2019-03-10 ENCOUNTER — Ambulatory Visit (INDEPENDENT_AMBULATORY_CARE_PROVIDER_SITE_OTHER): Payer: Managed Care, Other (non HMO) | Admitting: Family Medicine

## 2019-03-10 ENCOUNTER — Encounter: Payer: Self-pay | Admitting: Family Medicine

## 2019-03-10 VITALS — BP 150/80 | HR 120 | Ht 63.25 in | Wt 161.0 lb

## 2019-03-10 DIAGNOSIS — R109 Unspecified abdominal pain: Secondary | ICD-10-CM

## 2019-03-10 DIAGNOSIS — N390 Urinary tract infection, site not specified: Secondary | ICD-10-CM | POA: Diagnosis not present

## 2019-03-10 LAB — POCT URINALYSIS DIPSTICK
Bilirubin, UA: NEGATIVE
Blood, UA: NEGATIVE
Glucose, UA: NEGATIVE
Ketones, UA: NEGATIVE
Leukocytes, UA: NEGATIVE
Nitrite, UA: NEGATIVE
Protein, UA: NEGATIVE
Spec Grav, UA: 1.01 (ref 1.010–1.025)
Urobilinogen, UA: 0.2 E.U./dL
pH, UA: 6 (ref 5.0–8.0)

## 2019-03-10 MED ORDER — SULFAMETHOXAZOLE-TRIMETHOPRIM 800-160 MG PO TABS: 1.0000 | ORAL_TABLET | Freq: Two times a day (BID) | ORAL | 0 refills | Status: DC

## 2019-03-10 NOTE — Patient Instructions (Addendum)
If needed, you can take a extra metoprolol dose if your heart rate is elevated when you get home.  Drink plenty of water and start the antibiotics today.  We'll contact you with your lab report.  Take care.

## 2019-03-10 NOTE — Progress Notes (Signed)
Dysuria: she has the feeling of incomplete voiding, recently noted.  Pressure with urination.  Cloudy urine, "like a film is on the urine".  duration of symptoms: a few days.   abdominal pain: lower abd pressure.  fevers:no back pain:no Vomiting:no No diarrhea.   She drinks ample fluid at baseline.    She has white coat BP response, d/w pt.  Her BP is better at home.  She has h/o tachycardia, esp at MD visits.   Meds, vitals, and allergies reviewed.   Per HPI unless specifically indicated in ROS section   GEN: nad, alert and oriented HEENT: ncat NECK: supple CV: rrr.  PULM: ctab, no inc wob ABD: soft, +bs, suprapubic area not tender EXT: no edema SKIN: no acute rash BACK: no CVA pain  Recheck pulse 120.  BP 150/80, see avs.

## 2019-03-12 LAB — URINE CULTURE
MICRO NUMBER:: 1049279
SPECIMEN QUALITY:: ADEQUATE

## 2019-03-13 DIAGNOSIS — N390 Urinary tract infection, site not specified: Secondary | ICD-10-CM | POA: Insufficient documentation

## 2019-03-13 NOTE — Assessment & Plan Note (Signed)
Discussed with patient.  Still okay for outpatient follow-up.  This is assuming her blood pressure and pulse improve when she gets home.  She can take an extra dose of metoprolol if needed if she has persistent tachycardia.  She can update [place] as needed otherwise about her blood pressure and pulse.  Routine cautions given. Presumed UTI.  Start Septra.  Check urine culture.  She agrees to plan.

## 2019-03-21 ENCOUNTER — Other Ambulatory Visit: Payer: Managed Care, Other (non HMO)

## 2019-03-23 ENCOUNTER — Other Ambulatory Visit: Payer: Managed Care, Other (non HMO)

## 2019-03-23 DIAGNOSIS — A048 Other specified bacterial intestinal infections: Secondary | ICD-10-CM

## 2019-03-24 ENCOUNTER — Other Ambulatory Visit: Payer: Self-pay | Admitting: Gastroenterology

## 2019-03-24 ENCOUNTER — Other Ambulatory Visit: Payer: Self-pay | Admitting: Physician Assistant

## 2019-03-24 ENCOUNTER — Other Ambulatory Visit: Payer: Self-pay | Admitting: Family Medicine

## 2019-03-24 LAB — HELICOBACTER PYLORI  SPECIAL ANTIGEN
MICRO NUMBER:: 1094135
SPECIMEN QUALITY: ADEQUATE

## 2019-03-30 ENCOUNTER — Ambulatory Visit (INDEPENDENT_AMBULATORY_CARE_PROVIDER_SITE_OTHER): Payer: Managed Care, Other (non HMO) | Admitting: Family Medicine

## 2019-03-30 ENCOUNTER — Encounter: Payer: Self-pay | Admitting: Family Medicine

## 2019-03-30 ENCOUNTER — Telehealth: Payer: Self-pay | Admitting: Family Medicine

## 2019-03-30 ENCOUNTER — Other Ambulatory Visit: Payer: Self-pay

## 2019-03-30 VITALS — Temp 97.8°F | Ht 63.25 in | Wt 154.3 lb

## 2019-03-30 DIAGNOSIS — R1011 Right upper quadrant pain: Secondary | ICD-10-CM | POA: Diagnosis not present

## 2019-03-30 DIAGNOSIS — F411 Generalized anxiety disorder: Secondary | ICD-10-CM | POA: Insufficient documentation

## 2019-03-30 DIAGNOSIS — N390 Urinary tract infection, site not specified: Secondary | ICD-10-CM

## 2019-03-30 DIAGNOSIS — F321 Major depressive disorder, single episode, moderate: Secondary | ICD-10-CM

## 2019-03-30 DIAGNOSIS — F410 Panic disorder [episodic paroxysmal anxiety] without agoraphobia: Secondary | ICD-10-CM | POA: Diagnosis not present

## 2019-03-30 DIAGNOSIS — I10 Essential (primary) hypertension: Secondary | ICD-10-CM

## 2019-03-30 DIAGNOSIS — R7989 Other specified abnormal findings of blood chemistry: Secondary | ICD-10-CM

## 2019-03-30 DIAGNOSIS — F331 Major depressive disorder, recurrent, moderate: Secondary | ICD-10-CM | POA: Insufficient documentation

## 2019-03-30 MED ORDER — HYDROXYZINE HCL 10 MG PO TABS: 10.0000 mg | ORAL_TABLET | Freq: Three times a day (TID) | ORAL | 0 refills | Status: DC | PRN

## 2019-03-30 MED ORDER — FLUOXETINE HCL 20 MG PO TABS: 20.0000 mg | ORAL_TABLET | Freq: Every day | ORAL | 3 refills | Status: DC

## 2019-03-30 NOTE — Telephone Encounter (Signed)
Patient called back after her virtual visit and stated she was told she needed a virtual visit on 12/3  It has you out of office that week except for the 1st. Is there another day you would like her to be seen ?

## 2019-03-30 NOTE — Telephone Encounter (Signed)
Schedule has been opened and is ready to schedule and move patients as needed.

## 2019-03-30 NOTE — Assessment & Plan Note (Signed)
Symptoms resolved with antibiotics 

## 2019-03-30 NOTE — Assessment & Plan Note (Signed)
Start SSRI. Med SE and course discussed.

## 2019-03-30 NOTE — Assessment & Plan Note (Signed)
Causing pt anxiety... will re-eval off tylenol and ETOH at next OV.

## 2019-03-30 NOTE — Assessment & Plan Note (Signed)
Interferring with home and work life.  Unable to perform job functions given panic attacks, poor concentration and feeling overwhelmed.  Start SSRI.. fluoxetine given Mother did well on this in past. Atarax prn panic attackes. Refer to counseling. She was given phone number to set this up.  FMLA leave for 2 week 11/`18-04/17/2019.Marland Kitchen next app 04/14/2019. She will call to arrange.

## 2019-03-30 NOTE — Assessment & Plan Note (Signed)
Likely elevated in part due to anxiety. Follow up when mood improved.

## 2019-03-30 NOTE — Progress Notes (Signed)
VIRTUAL VISIT Due to national recommendations of social distancing due to COVID 19, a virtual visit is felt to be most appropriate for this patient at this time.   I connected with the patient on 03/30/19 at  8:20 AM EST by virtual telehealth platform and verified that I am speaking with the correct person using two identifiers.   I discussed the limitations, risks, security and privacy concerns of performing an evaluation and management service by  virtual telehealth platform and the availability of in person appointments. I also discussed with the patient that there may be a patient responsible charge related to this service. The patient expressed understanding and agreed to proceed.  Patient location: Home Provider Location: [facility] Participants: [name] and [name]   Chief Complaint  Patient presents with  . Anxiety    History of Present Illness:  33 year old female presents with new onset worsened anxiety in last 2-3 week. Worsening. Has been going on overall in last few months. Feels anxious throughout the day. She is depressed, sad, hopeless She has been more stressed out.Marland Kitchen tearful, crying, overwhelmed. Random spells of palpitations, anxiety, worry, feeling afraid. Last few minutes. Panic spells occurring 2 times a week.   Her Mom is with her today. Mother with depression, anxiety, OCD history.   Her mood is interfering with her relationships. She has had to leave work given a panic spell.  Anxiety worsens palpitations and blood pressure.   BP Readings from Last 3 Encounters:  03/10/19 (!) 150/80  01/31/19 130/78  10/18/18 121/80    GAD 7 : Generalized Anxiety Score 03/30/2019 10/08/2017  Nervous, Anxious, on Edge 3 0  Control/stop worrying 3 1  Worry too much - different things 3 1  Trouble relaxing 3 0  Restless 1 0  Easily annoyed or irritable 1 2  Afraid - awful might happen 1 0  Total GAD 7 Score 15 4  Anxiety Difficulty Very  difficult -     Office Visit from 03/30/2019 in [facility] Total Score  3        COVID 19 screen No recent travel or known exposure to COVID19 The patient denies respiratory symptoms of COVID 19 at this time.  The importance of social distancing was discussed today.   Review of Systems  Constitutional: Negative for chills and fever.  HENT: Negative for congestion and ear pain.   Eyes: Negative for pain and redness.  Respiratory: Negative for cough and shortness of breath.   Cardiovascular: Negative for chest pain, palpitations and leg swelling.  Gastrointestinal: Negative for abdominal pain, blood in stool, constipation, diarrhea, nausea and vomiting.  Genitourinary: Negative for dysuria.  Musculoskeletal: Negative for falls and myalgias.  Skin: Negative for rash.  Neurological: Negative for dizziness.  Psychiatric/Behavioral: Positive for depression. Negative for hallucinations, substance abuse and suicidal ideas. The patient is nervous/anxious and has insomnia.       Past Medical History:  Diagnosis Date  . Abnormal Pap smear AGE 46   COLPO LAST PAP 08/2011  . Allergy   . Anemia   . Complication of anesthesia    NAUSEA; VOMITING; ITCHING; DIFFICULTY BREATHING  . Genital herpes   . GERD (gastroesophageal reflux disease) AGE 19   tums prn  . History of chicken pox   . Hypertension   . Infection AGE 57   CHLAMYDIA; GONORRHEA  . 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 Infection    BV  .  Migraines   . Ovarian cyst rupture   . PONV (postoperative nausea and vomiting)   . Preterm labor   . Urinary incontinence     reports that she has never smoked. She has never used smokeless tobacco. She reports current alcohol use of about 3.0 standard drinks of alcohol per week. She reports that she does not use drugs.   Current Outpatient Medications:  .  amLODipine (NORVASC) 10 MG tablet, TAKE 1 TABLET BY MOUTH  DAILY, Disp: 90 tablet, Rfl: 3 .  cyclobenzaprine  (FLEXERIL) 10 MG tablet, TAKE 1 TABLET (10 MG TOTAL) BY MOUTH AT BEDTIME AS NEEDED FOR MUSCLE SPASMS., Disp: 15 tablet, Rfl: 0 .  EMGALITY 120 MG/ML SOAJ, INJECT 120MG  INTO THE SKIN EVERY 30 DAYS, Disp: 1 pen, Rfl: 11 .  losartan (COZAAR) 100 MG tablet, Take 1 tablet (100 mg total) by mouth daily. Please make overdue appt with Dr. Meda Coffee before anymore refills. 2nd attempt, Disp: 15 tablet, Rfl: 0 .  metoprolol succinate (TOPROL-XL) 25 MG 24 hr tablet, TAKE 1 TABLET BY MOUTH EVERY DAY, Disp: 90 tablet, Rfl: 1 .  pantoprazole (PROTONIX) 40 MG tablet, TAKE 1 TABLET BY MOUTH 2  TIMES DAILY BEFORE A MEAL., Disp: 180 tablet, Rfl: 3 .  potassium chloride SA (K-DUR) 20 MEQ tablet, Every, Su,M,W, F, Disp: 64 tablet, Rfl: 3 .  promethazine (PHENERGAN) 25 MG tablet, Take 1 tablet (25 mg total) by mouth every 6 (six) hours as needed for nausea or vomiting., Disp: 30 tablet, Rfl: 0 .  tolterodine (DETROL LA) 4 MG 24 hr capsule, TAKE 1 CAPSULE BY MOUTH  DAILY, Disp: 90 capsule, Rfl: 3 .  valACYclovir (VALTREX) 1000 MG tablet, Take 1 tablet (1,000 mg total) by mouth 2 (two) times daily., Disp: 20 tablet, Rfl: 0   Observations/Objective: Temperature 97.8 F (36.6 C), height 5' 3.25" (1.607 m), weight 154 lb 5 oz (70 kg).  Physical Exam  Physical Exam Constitutional:      General: The patient is not in acute distress. Pulmonary:     Effort: Pulmonary effort is normal. No respiratory distress.  Neurological:     Mental Status: The patient is alert and oriented to person, place, and time.  Psychiatric:        Mood and Affect: Mood anxious, tearful       Behavior: Behavior abnormal. Poor insight  Assessment and Plan GAD (generalized anxiety disorder) Interferring with home and work life.  Unable to perform job functions given panic attacks, poor concentration and feeling overwhelmed.  Start SSRI.. fluoxetine given Mother did well on this in past. Atarax prn panic attackes. Refer to counseling. She was  given phone number to set this up.  FMLA leave for 2 week 11/`18-04/17/2019.Marland Kitchen next app 04/14/2019. She will call to arrange.   Current moderate episode of major depressive disorder without prior episode ([place]) Start SSRI. Med SE and course discussed.  HTN (hypertension), benign Likely elevated in part due to anxiety. Follow up when mood improved.  Elevated liver function tests Causing pt anxiety... will re-eval off tylenol and ETOH at next OV.  Urinary tract infection without hematuria Symptoms resolved with antibiotics.  RUQ pain New onset in last week. Not related to meals. MIld pain. Re-eval in office at next [place].  Most likely MSK or secondary to anxiety over LFTs.     I discussed the assessment and treatment plan with the patient. The patient was provided an opportunity to ask questions and all were answered. The patient agreed  with the plan and demonstrated an understanding of the instructions.   The patient was advised to call back or seek an in-person evaluation if the symptoms worsen or if the condition fails to improve as anticipated.     [name], MD

## 2019-03-30 NOTE — Telephone Encounter (Signed)
I have opened up some days that week given my conference I was going to is now shorter and virtual. I will actually be working Dec 3 rd.. off 4th, off 8th.. Changes have likely not been made yet...  Mandy see email I sent   CC: Leafy Ro

## 2019-03-30 NOTE — Assessment & Plan Note (Signed)
New onset in last week. Not related to meals. MIld pain. Re-eval in office at next [place].  Most likely MSK or secondary to anxiety over LFTs.

## 2019-03-31 ENCOUNTER — Telehealth: Payer: Self-pay | Admitting: Family Medicine

## 2019-03-31 DIAGNOSIS — Z0279 Encounter for issue of other medical certificate: Secondary | ICD-10-CM

## 2019-03-31 NOTE — Telephone Encounter (Signed)
FMLA paperwork in Dr Diona Browner in box  For review and signature

## 2019-04-03 NOTE — Telephone Encounter (Signed)
Pt aware Copy faxed  Copy for pt Copy for billing Copy for scan

## 2019-04-11 ENCOUNTER — Telehealth: Payer: Self-pay | Admitting: Family Medicine

## 2019-04-11 DIAGNOSIS — R112 Nausea with vomiting, unspecified: Secondary | ICD-10-CM

## 2019-04-11 NOTE — Telephone Encounter (Signed)
1) Medication(s) Requested (by name): promethazine (PHENERGAN) 25 MG tablet   2) Pharmacy of Choice:[id number] [place] [place] - [address]

## 2019-04-13 ENCOUNTER — Ambulatory Visit (INDEPENDENT_AMBULATORY_CARE_PROVIDER_SITE_OTHER): Payer: Managed Care, Other (non HMO) | Admitting: Family Medicine

## 2019-04-13 ENCOUNTER — Other Ambulatory Visit: Payer: Self-pay

## 2019-04-13 ENCOUNTER — Encounter: Payer: Self-pay | Admitting: Family Medicine

## 2019-04-13 VITALS — Temp 98.3°F | Ht 63.25 in | Wt 154.5 lb

## 2019-04-13 DIAGNOSIS — R7989 Other specified abnormal findings of blood chemistry: Secondary | ICD-10-CM | POA: Diagnosis not present

## 2019-04-13 DIAGNOSIS — F321 Major depressive disorder, single episode, moderate: Secondary | ICD-10-CM

## 2019-04-13 DIAGNOSIS — F410 Panic disorder [episodic paroxysmal anxiety] without agoraphobia: Secondary | ICD-10-CM | POA: Diagnosis not present

## 2019-04-13 DIAGNOSIS — F411 Generalized anxiety disorder: Secondary | ICD-10-CM

## 2019-04-13 DIAGNOSIS — R1011 Right upper quadrant pain: Secondary | ICD-10-CM

## 2019-04-13 NOTE — Assessment & Plan Note (Signed)
Atarax low dose minimally effective.. can try to double dose prn.

## 2019-04-13 NOTE — Patient Instructions (Addendum)
Start melatonin for sleep.. call/ mychart message if sleep  not better in  few days  Can increase to 2 tabs as needed three times a day for anxiety.  Continue the current dose of fluoxetine.  Please stop at the lab to have labs drawn.

## 2019-04-13 NOTE — Assessment & Plan Note (Signed)
Poor control.. unchanged from 2 weeks ago.. remain out of FMLA for 2 weeks.. Follow up for re-assessment 1 day prior to levae ending.  SSRI will take at least 3-4 weeks total for it to start working. Continue current dose.  Counseling starting next week.   Start medication for insomnia.Marland Kitchen will try melatonin first.. if ineffective will try a trial of trazodone.

## 2019-04-13 NOTE — Assessment & Plan Note (Signed)
Nml liver exam, no pain. Re-eval with labs.

## 2019-04-13 NOTE — Progress Notes (Signed)
Date changed on paperwork and refaxed Copy for pt Copy for scan   04/13/2019/rbh

## 2019-04-13 NOTE — Progress Notes (Signed)
Chief Complaint  Patient presents with  . Follow-up    Mood    History of Present Illness: HPI   33 year old female presents for 2 week follow up GAD, panic attacks and depression. At last appt, she was started on fluoxetine and atarax prn panic attacks. No SE to the fluoxetine except possible headaches.   She has not used atarax until today.  She is worrying constantly, poor sleep (not falling alseep or staying asleep) Poor appetite  See questionnaire for specific symptoms and scale of severity.   First appt with counselor  On 04/17/2019.   She has been out of work on [employer] in last 2 weeks. Only used tylenol for HA 2 times in last month.  BP Readings from Last 3 Encounters:  03/10/19 (!) 150/80  01/31/19 130/78  10/18/18 121/80    She is worried about her elevated liver function tests. Slight discomfort in RUQ.    This visit occurred during the SARS-CoV-2 public health emergency.  Safety protocols were in place, including screening questions prior to the visit, additional usage of staff PPE, and extensive cleaning of exam room while observing appropriate contact time as indicated for disinfecting solutions.   COVID 19 screen:  No recent travel or known exposure to COVID19 The patient denies respiratory symptoms of COVID 19 at this time. The importance of social distancing was discussed today.     ROS    Past Medical History:  Diagnosis Date  . Abnormal Pap smear AGE 21   COLPO LAST PAP 08/2011  . Allergy   . Anemia   . Complication of anesthesia    NAUSEA; VOMITING; ITCHING; DIFFICULTY BREATHING  . Genital herpes   . GERD (gastroesophageal reflux disease) AGE 32   tums prn  . History of chicken pox   . Hypertension   . Infection AGE 60   CHLAMYDIA; GONORRHEA  . 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 Infection    BV  . Migraines   . Ovarian cyst rupture   . PONV (postoperative nausea and vomiting)   . Preterm labor   . Urinary incontinence     reports that she  has never smoked. She has never used smokeless tobacco. She reports current alcohol use of about 3.0 standard drinks of alcohol per week. She reports that she does not use drugs.   Current Outpatient Medications:  .  amLODipine (NORVASC) 10 MG tablet, TAKE 1 TABLET BY MOUTH  DAILY, Disp: 90 tablet, Rfl: 3 .  cyclobenzaprine (FLEXERIL) 10 MG tablet, TAKE 1 TABLET (10 MG TOTAL) BY MOUTH AT BEDTIME AS NEEDED FOR MUSCLE SPASMS., Disp: 15 tablet, Rfl: 0 .  EMGALITY 120 MG/ML SOAJ, INJECT 120MG  INTO THE SKIN EVERY 30 DAYS, Disp: 1 pen, Rfl: 11 .  FLUoxetine (PROZAC) 20 MG tablet, Take 1 tablet (20 mg total) by mouth daily., Disp: 30 tablet, Rfl: 3 .  hydrOXYzine (ATARAX/VISTARIL) 10 MG tablet, Take 1 tablet (10 mg total) by mouth 3 (three) times daily as needed for anxiety., Disp: 30 tablet, Rfl: 0 .  losartan (COZAAR) 100 MG tablet, Take 1 tablet (100 mg total) by mouth daily. Please make overdue appt with Dr. Delton SeeNelson before anymore refills. 2nd attempt, Disp: 15 tablet, Rfl: 0 .  metoprolol succinate (TOPROL-XL) 25 MG 24 hr tablet, TAKE 1 TABLET BY MOUTH EVERY DAY, Disp: 90 tablet, Rfl: 1 .  pantoprazole (PROTONIX) 40 MG tablet, TAKE 1 TABLET BY MOUTH 2  TIMES DAILY BEFORE A MEAL., Disp: 180  tablet, Rfl: 3 .  potassium chloride SA (K-DUR) 20 MEQ tablet, Every, Su,M,W, F, Disp: 64 tablet, Rfl: 3 .  promethazine (PHENERGAN) 25 MG tablet, Take 1 tablet (25 mg total) by mouth every 6 (six) hours as needed for nausea or vomiting., Disp: 30 tablet, Rfl: 0 .  tolterodine (DETROL LA) 4 MG 24 hr capsule, TAKE 1 CAPSULE BY MOUTH  DAILY, Disp: 90 capsule, Rfl: 3 .  valACYclovir (VALTREX) 1000 MG tablet, Take 1 tablet (1,000 mg total) by mouth 2 (two) times daily., Disp: 20 tablet, Rfl: 0   Observations/Objective: Temperature 98.3 F (36.8 C), temperature source Temporal, height 5' 3.25" (1.607 m), weight 154 lb 8 oz (70.1 kg). She refused to have vitals given her anxiety. Previous BPs in nml range    Physical  Exam Constitutional:      General: She is not in acute distress.    Appearance: Normal appearance. She is well-developed. She is not ill-appearing or toxic-appearing.  HENT:     Head: Normocephalic.     Right Ear: Hearing, tympanic membrane, ear canal and external ear normal. Tympanic membrane is not erythematous, retracted or bulging.     Left Ear: Hearing, tympanic membrane, ear canal and external ear normal. Tympanic membrane is not erythematous, retracted or bulging.     Nose: No mucosal edema or rhinorrhea.     Right Sinus: No maxillary sinus tenderness or frontal sinus tenderness.     Left Sinus: No maxillary sinus tenderness or frontal sinus tenderness.     Mouth/Throat:     Pharynx: Uvula midline.  Eyes:     General: Lids are normal. Lids are everted, no foreign bodies appreciated.     Conjunctiva/sclera: Conjunctivae normal.     Pupils: Pupils are equal, round, and reactive to light.  Neck:     Musculoskeletal: Normal range of motion and neck supple.     Thyroid: No thyroid mass or thyromegaly.     Vascular: No carotid bruit.     Trachea: Trachea normal.  Cardiovascular:     Rate and Rhythm: Regular rhythm. Tachycardia present.     Pulses: Normal pulses.     Heart sounds: Normal heart sounds, S1 normal and S2 normal. No murmur. No friction rub. No gallop.   Pulmonary:     Effort: Pulmonary effort is normal. No tachypnea or respiratory distress.     Breath sounds: Normal breath sounds. No decreased breath sounds, wheezing, rhonchi or rales.  Abdominal:     General: Bowel sounds are normal.     Palpations: Abdomen is soft.     Tenderness: There is no abdominal tenderness.  Skin:    General: Skin is warm and dry.     Findings: No rash.  Neurological:     Mental Status: She is alert.  Psychiatric:        Mood and Affect: Mood is anxious. Mood is not depressed.        Speech: Speech normal.        Behavior: Behavior is agitated. Behavior is cooperative.        Thought  Content: Thought content normal.        Judgment: Judgment normal.      Assessment and Plan   Panic attacks  Atarax low dose minimally effective.. can try to double dose prn.  Elevated liver function tests Nml liver exam, no pain. Re-eval with labs.  Current moderate episode of major depressive disorder without prior episode (HCC) Poor control.. unchanged from 2 weeks  ago.. remain out of FMLA for 2 weeks.. Follow up for re-assessment 1 day prior to levae ending.  SSRI will take at least 3-4 weeks total for it to start working. Continue current dose.  Counseling starting next week.   Start medication for insomnia.Marland Kitchen will try melatonin first.. if ineffective will try a trial of trazodone.    GAD (generalized anxiety disorder) Poor control.. unable to function and complete job description.     Eliezer Lofts, MD

## 2019-04-13 NOTE — Assessment & Plan Note (Signed)
Poor control.. unable to function and complete job description.

## 2019-04-14 LAB — COMPREHENSIVE METABOLIC PANEL
ALT: 24 IU/L (ref 0–32)
AST: 25 IU/L (ref 0–40)
Albumin/Globulin Ratio: 1.9 (ref 1.2–2.2)
Albumin: 4.9 g/dL — ABNORMAL HIGH (ref 3.8–4.8)
Alkaline Phosphatase: 55 IU/L (ref 39–117)
BUN/Creatinine Ratio: 12 (ref 9–23)
BUN: 9 mg/dL (ref 6–20)
Bilirubin Total: 0.5 mg/dL (ref 0.0–1.2)
CO2: 22 mmol/L (ref 20–29)
Calcium: 10.3 mg/dL — ABNORMAL HIGH (ref 8.7–10.2)
Chloride: 97 mmol/L (ref 96–106)
Creatinine, Ser: 0.73 mg/dL (ref 0.57–1.00)
GFR calc Af Amer: 124 mL/min/{1.73_m2} (ref >59)
GFR calc non Af Amer: 108 mL/min/{1.73_m2} (ref >59)
Globulin, Total: 2.6 g/dL (ref 1.5–4.5)
Glucose: 123 mg/dL — ABNORMAL HIGH (ref 65–99)
Potassium: 4.3 mmol/L (ref 3.5–5.2)
Sodium: 137 mmol/L (ref 134–144)
Total Protein: 7.5 g/dL (ref 6.0–8.5)

## 2019-04-17 ENCOUNTER — Ambulatory Visit (INDEPENDENT_AMBULATORY_CARE_PROVIDER_SITE_OTHER): Payer: 59 | Admitting: Psychology

## 2019-04-17 DIAGNOSIS — F411 Generalized anxiety disorder: Secondary | ICD-10-CM

## 2019-04-17 DIAGNOSIS — F4321 Adjustment disorder with depressed mood: Secondary | ICD-10-CM

## 2019-04-17 NOTE — Telephone Encounter (Signed)
Routing to NP Amy Lomax

## 2019-04-18 DIAGNOSIS — Z87898 Personal history of other specified conditions: Secondary | ICD-10-CM | POA: Insufficient documentation

## 2019-04-18 MED ORDER — PROMETHAZINE HCL 25 MG PO TABS: 25.0000 mg | ORAL_TABLET | Freq: Four times a day (QID) | ORAL | 0 refills | Status: DC | PRN

## 2019-04-18 NOTE — Telephone Encounter (Signed)
Reordered phenergan prn.

## 2019-04-18 NOTE — Addendum Note (Signed)
Addended by: Brandon Melnick on: 04/18/2019 01:20 PM   Modules accepted: Orders

## 2019-04-18 NOTE — Progress Notes (Signed)
Virtual Visit via Video Note   This visit type was conducted due to national recommendations for restrictions regarding the COVID-19 Pandemic (e.g. social distancing) in an effort to limit this patient's exposure and mitigate transmission in our community.  Due to her co-morbid illnesses, this patient is at least at moderate risk for complications without adequate follow up.  This format is felt to be most appropriate for this patient at this time.  All issues noted in this document were discussed and addressed.  A limited physical exam was performed with this format.  Please refer to the patient's chart for her consent to telehealth for [facility]   Date:  04/19/2019   ID:  Angela [name], DOB 07/27/1985, MRN [id number]  Patient Location: Home Provider Location: Home  PCP:  Jinny Sanders, MD  Cardiologist:  Ena Dawley, MD  Electrophysiologist:  None   Evaluation Performed:  Follow-Up Visit  Chief Complaint: Follow-up  History of Present Illness:    Angela [name] is a 33 y.o. female with history of HTN, chest pain and palpitations with sinus tachycardia, panic attacks 2D echo 06/2017 normal LVEF 60 to 65%, ETT 07/08/2017 normal.  Last saw Dayna Dunn PA-C 11/16/2017 who added low-dose Toprol to her losartan because she was having palpitations and sinus tachycardia up to 112 bpm.  Asked her to limit NSAIDs.  Patient in ER 10/12/18 with tachycardia and was found to have hypokalemia with potassium of 2.6 and was given IV and oral replacement as well as oral metoprolol.  Patient says she hasn't had any more tachycardia since she is taking potassium 4 days/ week. Heart only races when she has panic attacks.  Labs reviewed from September were all stable including normal lipid panel.  No regular exercise other than playing with her children in the yard and walking some.  The patient does not have symptoms concerning for COVID-19 infection (fever, chills, cough, or new  shortness of breath).    Past Medical History:  Diagnosis Date  . Abnormal Pap smear AGE 22   COLPO LAST PAP 08/2011  . Allergy   . Anemia   . Complication of anesthesia    NAUSEA; VOMITING; ITCHING; DIFFICULTY BREATHING  . Genital herpes   . GERD (gastroesophageal reflux disease) AGE 68   tums prn  . History of chicken pox   . Hypertension   . Infection AGE 70   CHLAMYDIA; GONORRHEA  . 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 Infection    BV  . Migraines   . Ovarian cyst rupture   . PONV (postoperative nausea and vomiting)   . Preterm labor   . Urinary incontinence    Past Surgical History:  Procedure Laterality Date  . CERVIX LESION DESTRUCTION    . CESAREAN SECTION  2005, 2010   x2  . CESAREAN SECTION WITH BILATERAL TUBAL LIGATION Bilateral 11/15/2012   Procedure: REPEAT CESAREAN SECTION WITH BILATERAL TUBAL LIGATION;  Surgeon: Ena Dawley, MD;  Location: [facility];  Service: Obstetrics;  Laterality: Bilateral;  . COLPOSCOPY    . UNILATERAL SALPINGECTOMY Right 11/15/2012   Procedure: UNILATERAL SALPINGECTOMY;  Surgeon: Ena Dawley, MD;  Location: [facility];  Service: Obstetrics;  Laterality: Right;  . WISDOM TOOTH EXTRACTION       Current Meds  Medication Sig  . amLODipine (NORVASC) 10 MG tablet TAKE 1 TABLET BY MOUTH  DAILY  . cyclobenzaprine (FLEXERIL) 10 MG tablet TAKE 1 TABLET (10 MG TOTAL) BY MOUTH AT BEDTIME AS NEEDED  FOR MUSCLE SPASMS.  Marland Kitchen. EMGALITY 120 MG/ML SOAJ INJECT 120MG  INTO THE SKIN EVERY 30 DAYS  . FLUoxetine (PROZAC) 20 MG tablet Take 1 tablet (20 mg total) by mouth daily.  . hydrOXYzine (ATARAX/VISTARIL) 10 MG tablet Take 1 tablet (10 mg total) by mouth 3 (three) times daily as needed for anxiety.  Marland Kitchen. losartan (COZAAR) 100 MG tablet Take 1 tablet (100 mg total) by mouth daily. Please make overdue appt with Dr. Delton SeeNelson before anymore refills. 2nd attempt  . metoprolol succinate (TOPROL-XL) 25 MG 24 hr tablet TAKE 1 TABLET BY MOUTH EVERY DAY  . pantoprazole (PROTONIX)  40 MG tablet TAKE 1 TABLET BY MOUTH 2  TIMES DAILY BEFORE A MEAL. (Patient taking differently: Take 40 mg by mouth daily. )  . potassium chloride SA (K-DUR) 20 MEQ tablet Every, Su,M,W, F  . promethazine (PHENERGAN) 25 MG tablet Take 1 tablet (25 mg total) by mouth every 6 (six) hours as needed for nausea or vomiting.  . tolterodine (DETROL LA) 4 MG 24 hr capsule TAKE 1 CAPSULE BY MOUTH  DAILY  . valACYclovir (VALTREX) 1000 MG tablet Take 1 tablet (1,000 mg total) by mouth 2 (two) times daily.     Allergies:   Amoxicillin and Dexilant [dexlansoprazole]   Social History   Tobacco Use  . Smoking status: Never Smoker  . Smokeless tobacco: Never Used  Substance Use Topics  . Alcohol use: Yes    Alcohol/week: 3.0 standard drinks    Types: 2 Glasses of wine, 1 Shots of liquor per week    Comment: weekends  . Drug use: No     Family Hx: The patient's family history includes Anesthesia problems in her mother and another family member; Arthritis in her maternal grandmother; Asthma in her cousin; Depression in her maternal aunt and mother; Diabetes in her father; Fibromyalgia in her mother; Heart disease in her maternal grandmother; Hypertension in her maternal grandmother and sister; Kidney disease in her maternal grandmother; Liver disease in her cousin; Other in her mother; Prostate cancer in her maternal grandfather; Sarcoidosis in her maternal aunt and mother; Thyroid disease in her mother. There is no history of Colon cancer, Esophageal cancer, Pancreatic cancer, Stomach cancer, Inflammatory bowel disease, or Rectal cancer.  ROS:   Please see the history of present illness.      All other systems reviewed and are negative.   Prior CV studies:   The following studies were reviewed today:  2D echo 06/23/2017 Study Conclusions   - Left ventricle: The cavity size was normal. Wall thickness was   normal. Systolic function was normal. The estimated ejection   fraction was in the range of  60% to 65%. Left ventricular   diastolic function parameters were normal. - Atrial septum: No defect or patent foramen ovale was identified.   GXT 07/08/2017   Blood pressure demonstrated a normal response to exercise.  There was no ST segment deviation noted during stress.   1. Normal exercise tolerance.  2. No ischemic ST changes with exercise.      Labs/Other Tests and Data Reviewed:    EKG:  An ECG dated 10/14/18 was personally reviewed today and demonstrated:  sinus tachycardia 167/m with short PR interval  Recent Labs: 10/12/2018: TSH 3.182 10/28/2018: Hemoglobin 12.1; Platelets 356 12/16/2018: Magnesium 1.9 04/13/2019: ALT 24; BUN 9; Creatinine, Ser 0.73; Potassium 4.3; Sodium 137   Recent Lipid Panel Lab Results  Component Value Date/Time   CHOL 134 01/17/2019 08:26 AM   TRIG 119  01/17/2019 08:26 AM   HDL 74 01/17/2019 08:26 AM   CHOLHDL 1.8 01/17/2019 08:26 AM   LDLCALC 39 01/17/2019 08:26 AM    Wt Readings from Last 3 Encounters:  04/19/19 154 lb (69.9 kg)  04/13/19 154 lb 8 oz (70.1 kg)  03/30/19 154 lb 5 oz (70 kg)     Objective:    Vital Signs:  BP 117/79   Pulse 82   Wt 154 lb (69.9 kg)   BMI 27.06 kg/m    VITAL SIGNS:  reviewed GEN:  no acute distress RESPIRATORY:  normal respiratory effort, symmetric expansion CARDIOVASCULAR:  no peripheral edema  ASSESSMENT & PLAN:    Essential hypertension well-controlled on Norvasc losartan and metoprolol  History of chest pain with normal ETT 04/2018 no recent chest pain  Palpitations/sinus tachycardia-in ER 10/2018 and potassium 2.6 with sinus tachycardia at 167 days a week and has had no further tachycardia  COVID-19 Education: The signs and symptoms of COVID-19 were discussed with the patient and how to seek care for testing (follow up with PCP or arrange E-visit). The importance of social distancing was discussed today.  Time:   Today, I have spent 10 minutes with the patient with telehealth technology  discussing the above problems.     Medication Adjustments/Labs and Tests Ordered: Current medicines are reviewed at length with the patient today.  Concerns regarding medicines are outlined above.   Tests Ordered: No orders of the defined types were placed in this encounter.   Medication Changes: No orders of the defined types were placed in this encounter.   Follow Up:  Either In Person or Virtual in 1 year(s) Dr. Delton See  Signed, Jacolyn Reedy, PA-C  04/19/2019 8:08 AM    [facility]

## 2019-04-19 ENCOUNTER — Other Ambulatory Visit: Payer: Self-pay

## 2019-04-19 ENCOUNTER — Encounter: Payer: Self-pay | Admitting: Physician Assistant

## 2019-04-19 ENCOUNTER — Telehealth (INDEPENDENT_AMBULATORY_CARE_PROVIDER_SITE_OTHER): Payer: Managed Care, Other (non HMO) | Admitting: Physician Assistant

## 2019-04-19 VITALS — BP 117/79 | HR 82 | Wt 154.0 lb

## 2019-04-19 DIAGNOSIS — R002 Palpitations: Secondary | ICD-10-CM

## 2019-04-19 DIAGNOSIS — Z87898 Personal history of other specified conditions: Secondary | ICD-10-CM | POA: Diagnosis not present

## 2019-04-19 DIAGNOSIS — I10 Essential (primary) hypertension: Secondary | ICD-10-CM

## 2019-04-19 DIAGNOSIS — R000 Tachycardia, unspecified: Secondary | ICD-10-CM

## 2019-04-19 NOTE — Patient Instructions (Signed)
Medication Instructions:  Your physician recommends that you continue on your current medications as directed. Please refer to the Current Medication list given to you today.  *If you need a refill on your cardiac medications before your next appointment, please call your pharmacy*  Lab Work: None ordered  If you have labs (blood work) drawn today and your tests are completely normal, you will receive your results only by: . MyChart Message (if you have MyChart) OR . A paper copy in the mail If you have any lab test that is abnormal or we need to change your treatment, we will call you to review the results.  Testing/Procedures: None ordered  Follow-Up: At [facility], you and your health needs are our priority.  As part of our continuing mission to provide you with exceptional heart care, we have created designated Provider Care Teams.  These Care Teams include your primary Cardiologist (physician) and Advanced Practice Providers (APPs -  Physician Assistants and Nurse Practitioners) who all work together to provide you with the care you need, when you need it.  Your next appointment:   12 month(s)  The format for your next appointment:   In Person  Provider:   You may see Katarina Nelson, MD or one of the following Advanced Practice Providers on your designated Care Team:    Dayna Dunn, PA-C  Michele Lenze, PA-C   Other Instructions   

## 2019-04-25 ENCOUNTER — Ambulatory Visit (INDEPENDENT_AMBULATORY_CARE_PROVIDER_SITE_OTHER): Payer: 59 | Admitting: Psychology

## 2019-04-25 DIAGNOSIS — F4321 Adjustment disorder with depressed mood: Secondary | ICD-10-CM

## 2019-04-25 DIAGNOSIS — F411 Generalized anxiety disorder: Secondary | ICD-10-CM | POA: Diagnosis not present

## 2019-04-28 ENCOUNTER — Ambulatory Visit (INDEPENDENT_AMBULATORY_CARE_PROVIDER_SITE_OTHER): Payer: Managed Care, Other (non HMO) | Admitting: Family Medicine

## 2019-04-28 ENCOUNTER — Telehealth: Payer: Self-pay | Admitting: Family Medicine

## 2019-04-28 ENCOUNTER — Encounter: Payer: Self-pay | Admitting: Family Medicine

## 2019-04-28 ENCOUNTER — Other Ambulatory Visit: Payer: Self-pay

## 2019-04-28 VITALS — BP 113/75 | HR 74 | Temp 97.9°F | Ht 63.25 in | Wt 153.0 lb

## 2019-04-28 DIAGNOSIS — F411 Generalized anxiety disorder: Secondary | ICD-10-CM

## 2019-04-28 DIAGNOSIS — F321 Major depressive disorder, single episode, moderate: Secondary | ICD-10-CM

## 2019-04-28 DIAGNOSIS — F410 Panic disorder [episodic paroxysmal anxiety] without agoraphobia: Secondary | ICD-10-CM | POA: Diagnosis not present

## 2019-04-28 MED ORDER — FLUOXETINE HCL 20 MG PO TABS: 40.0000 mg | ORAL_TABLET | Freq: Every day | ORAL | 3 refills | Status: DC

## 2019-04-28 NOTE — Telephone Encounter (Signed)
[facility] faxed mental health eval In dr Diona Browner in box   Pt has appointment 12/18

## 2019-04-28 NOTE — Progress Notes (Signed)
VIRTUAL VISIT Due to national recommendations of social distancing due to COVID 19, a virtual visit is felt to be most appropriate for this patient at this time.   I connected with the patient on 04/28/19 at 11:20 AM EST by virtual telehealth platform and verified that I am speaking with the correct person using two identifiers.   I discussed the limitations, risks, security and privacy concerns of performing an evaluation and management service by  virtual telehealth platform and the availability of in person appointments. I also discussed with the patient that there may be a patient responsible charge related to this service. The patient expressed understanding and agreed to proceed.  Patient location: Home Provider Location: [facility] Participants: [name] and [name]   Chief Complaint  Patient presents with  . Follow-up    GAD    History of Present Illness:   33 year old female presents for follow up on GAD /MDD   She has now been on fluoxetine for 1 month. Started counseling.  Using atarax as needed for panic attacks. At last OV 12/3 she was started on melatoinin for insomnia.   She feels like she is starting to improve. She feels 10% better overall.  She is less worried less anxious. Panic attacks occurring 2-3 x per 2 weeks. Melatonin has helped her sleep.   GAD 7 : Generalized Anxiety Score 04/28/2019 04/13/2019 03/30/2019 10/08/2017  Nervous, Anxious, on Edge 1 3 3  0  Control/stop worrying 1 3 3 1   Worry too much - different things 1 3 3 1   Trouble relaxing 1 2 3  0  Restless 1 1 1  0  Easily annoyed or irritable 1 1 1 2   Afraid - awful might happen 1 3 1  0  Total GAD 7 Score 7 16 15 4   Anxiety Difficulty Somewhat difficult Extremely difficult Very difficult -   PHQ9 SCORE ONLY 04/28/2019 04/13/2019 03/30/2019  Score 7 12 10      COVID 19 screen No recent travel or known exposure to COVID19 The patient denies respiratory symptoms of  COVID 19 at this time.  The importance of social distancing was discussed today.   Review of Systems  Constitutional: Negative for chills and fever.  HENT: Negative for congestion and ear pain.   Eyes: Negative for pain and redness.  Respiratory: Negative for cough and shortness of breath.   Cardiovascular: Negative for chest pain, palpitations and leg swelling.  Gastrointestinal: Negative for abdominal pain, blood in stool, constipation, diarrhea, nausea and vomiting.  Genitourinary: Negative for dysuria.  Musculoskeletal: Negative for falls and myalgias.  Skin: Negative for rash.  Neurological: Negative for dizziness.  Psychiatric/Behavioral: Positive for depression. Negative for hallucinations, substance abuse and suicidal ideas. The patient is nervous/anxious.       Past Medical History:  Diagnosis Date  . Abnormal Pap smear AGE 75   COLPO LAST PAP 08/2011  . Allergy   . Anemia   . Complication of anesthesia    NAUSEA; VOMITING; ITCHING; DIFFICULTY BREATHING  . Genital herpes   . GERD (gastroesophageal reflux disease) AGE 15   tums prn  . History of chicken pox   . Hypertension   . Infection AGE 56   CHLAMYDIA; GONORRHEA  . 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 Infection    BV  . Migraines   . Ovarian cyst rupture   . PONV (postoperative nausea and vomiting)   . Preterm labor   . Urinary incontinence  reports that she has never smoked. She has never used smokeless tobacco. She reports current alcohol use of about 3.0 standard drinks of alcohol per week. She reports that she does not use drugs.   Current Outpatient Medications:  .  amLODipine (NORVASC) 10 MG tablet, TAKE 1 TABLET BY MOUTH  DAILY, Disp: 90 tablet, Rfl: 3 .  cyclobenzaprine (FLEXERIL) 10 MG tablet, TAKE 1 TABLET (10 MG TOTAL) BY MOUTH AT BEDTIME AS NEEDED FOR MUSCLE SPASMS., Disp: 15 tablet, Rfl: 0 .  EMGALITY 120 MG/ML SOAJ, INJECT 120MG  INTO THE SKIN EVERY 30 DAYS, Disp: 1 pen, Rfl: 11 .  FLUoxetine (PROZAC)  20 MG tablet, Take 1 tablet (20 mg total) by mouth daily., Disp: 30 tablet, Rfl: 3 .  hydrOXYzine (ATARAX/VISTARIL) 10 MG tablet, Take 1 tablet (10 mg total) by mouth 3 (three) times daily as needed for anxiety., Disp: 30 tablet, Rfl: 0 .  losartan (COZAAR) 100 MG tablet, Take 1 tablet (100 mg total) by mouth daily. Please make overdue appt with Dr. Meda Coffee before anymore refills. 2nd attempt, Disp: 15 tablet, Rfl: 0 .  metoprolol succinate (TOPROL-XL) 25 MG 24 hr tablet, TAKE 1 TABLET BY MOUTH EVERY DAY, Disp: 90 tablet, Rfl: 1 .  pantoprazole (PROTONIX) 40 MG tablet, Take 40 mg by mouth daily., Disp: , Rfl:  .  potassium chloride SA (K-DUR) 20 MEQ tablet, Every, Su,M,W, F, Disp: 64 tablet, Rfl: 3 .  promethazine (PHENERGAN) 25 MG tablet, Take 1 tablet (25 mg total) by mouth every 6 (six) hours as needed for nausea or vomiting., Disp: 30 tablet, Rfl: 0 .  tolterodine (DETROL LA) 4 MG 24 hr capsule, TAKE 1 CAPSULE BY MOUTH  DAILY, Disp: 90 capsule, Rfl: 3 .  valACYclovir (VALTREX) 1000 MG tablet, Take 1 tablet (1,000 mg total) by mouth 2 (two) times daily., Disp: 20 tablet, Rfl: 0   Observations/Objective: Blood pressure 113/75, pulse 74, temperature 97.9 F (36.6 C), temperature source Oral, height 5' 3.25" (1.607 m), weight 153 lb (69.4 kg).  Physical Exam  Physical Exam Constitutional:      General: The patient is not in acute distress. Pulmonary:     Effort: Pulmonary effort is normal. No respiratory distress.  Neurological:     Mental Status: The patient is alert and oriented to person, place, and time.  Psychiatric:        Mood and Affect: Mood normal.        Behavior: Behavior normal.   Assessment and Plan  MDD, GAD, panic attacks: Plan increase in prozac to 40 mg daily.  Continue counseling.  Can use altarax as needed.  continue melatonin for insomnia.  FMLA given for 2 more weeks ( till 05/16/2018) pt still unable to return to work as mood issues interfering with concentration,  memory, daily functioning and abilities to interact with coworkers and clients.  Follow up in 2 weeks. 05/15/2018      I discussed the assessment and treatment plan with the patient. The patient was provided an opportunity to ask questions and all were answered. The patient agreed with the plan and demonstrated an understanding of the instructions.   The patient was advised to call back or seek an in-person evaluation if the symptoms worsen or if the condition fails to improve as anticipated.     Eliezer Lofts, MD

## 2019-05-01 NOTE — Telephone Encounter (Signed)
Spoke with [facility] - they refilled her November Prozac Rx -- they never received the Rx for the dose change 04/28/2019 Verbal given. They are unable to take medication back or discard the Rx the patient has on hand at home so she will now have extra tablets.   Will call patient and tell her to take the RX as prescribed 40mg  daily and used up her current Rx that she has on hand and then start taking the new bottle with #60 tablets in it.  Insurance covered the dose change and new RX today.    Pt aware. Nothing further needed.   Will send to Dr Diona Browner as Juluis Rainier.

## 2019-05-08 ENCOUNTER — Ambulatory Visit (INDEPENDENT_AMBULATORY_CARE_PROVIDER_SITE_OTHER): Payer: 59 | Admitting: Psychology

## 2019-05-08 DIAGNOSIS — F4320 Adjustment disorder, unspecified: Secondary | ICD-10-CM | POA: Diagnosis not present

## 2019-05-08 DIAGNOSIS — F411 Generalized anxiety disorder: Secondary | ICD-10-CM

## 2019-05-14 ENCOUNTER — Other Ambulatory Visit: Payer: Self-pay | Admitting: Family Medicine

## 2019-05-16 ENCOUNTER — Other Ambulatory Visit: Payer: Self-pay

## 2019-05-16 ENCOUNTER — Encounter: Payer: Self-pay | Admitting: Family Medicine

## 2019-05-16 ENCOUNTER — Ambulatory Visit (INDEPENDENT_AMBULATORY_CARE_PROVIDER_SITE_OTHER): Payer: Managed Care, Other (non HMO) | Admitting: Family Medicine

## 2019-05-16 VITALS — BP 112/78 | HR 77 | Temp 97.9°F | Ht 63.25 in | Wt 154.0 lb

## 2019-05-16 DIAGNOSIS — F411 Generalized anxiety disorder: Secondary | ICD-10-CM

## 2019-05-16 DIAGNOSIS — F321 Major depressive disorder, single episode, moderate: Secondary | ICD-10-CM

## 2019-05-16 NOTE — Progress Notes (Signed)
VIRTUAL VISIT Due to national recommendations of social distancing due to COVID 19, a virtual visit is felt to be most appropriate for this patient at this time.   I connected with the patient on 05/16/19 at 11:40 AM EST by virtual telehealth platform and verified that I am speaking with the correct person using two identifiers.   I discussed the limitations, risks, security and privacy concerns of performing an evaluation and management service by  virtual telehealth platform and the availability of in person appointments. I also discussed with the patient that there may be a patient responsible charge related to this service. The patient expressed understanding and agreed to proceed.  Patient location: Home Provider Location:  [facility] Participants: [name] and [name]   Chief Complaint  Patient presents with  . Follow-up    MDD/GAD    History of Present Illness:  34 year old female presents for follow up MDD/GAD/panic attacks.   She reports her mood is improving.. noted about 50% improvement overall.  She has been on  40 mg dose of prozac for the last 2 weeks. NO SE at this time (initial HA and dizziness)  She is feeling less fatigued.  Sleeps well but wakes at 4 Am, some issue going back to sleep at time.  Using melatonin at night.  She is seeing counselor about every 2 weeks.  She is still isolating herself, trying to leave house more.. still panicky leaving house. She is very anxious  " terrified about going back to work" She doesn't want to relapse. Depression screen [facility] 2/9 05/16/2019 04/28/2019 04/13/2019  Decreased Interest 1 1 0  Down, Depressed, Hopeless 1 1 2   PHQ - 2 Score 2 2 2   Altered sleeping 1 1 3   Tired, decreased energy 1 2 2   Change in appetite 1 1 3   Feeling bad or failure about yourself  0 0 0  Trouble concentrating 1 - 2  Moving slowly or fidgety/restless 0 1 0  Suicidal thoughts 0 0 0  PHQ-9 Score 6 7 12   Difficult doing  work/chores Somewhat difficult Somewhat difficult Extremely dIfficult   She is having less panic attacks. GAD 7 : Generalized Anxiety Score 05/16/2019 04/28/2019 04/13/2019 03/30/2019  Nervous, Anxious, on Edge 1 1 3 3   Control/stop worrying 1 1 3 3   Worry too much - different things 1 1 3 3   Trouble relaxing 1 1 2 3   Restless 1 1 1 1   Easily annoyed or irritable 1 1 1 1   Afraid - awful might happen 1 1 3 1   Total GAD 7 Score 7 7 16 15   Anxiety Difficulty Somewhat difficult Somewhat difficult Extremely difficult Very difficult     COVID 19 screen No recent travel or known exposure to COVID19 The patient denies respiratory symptoms of COVID 19 at this time.  The importance of social distancing was discussed today.   Review of Systems  Constitutional: Negative for chills and fever.  HENT: Negative for congestion and ear pain.   Eyes: Negative for pain and redness.  Respiratory: Negative for cough and shortness of breath.   Cardiovascular: Negative for chest pain, palpitations and leg swelling.  Gastrointestinal: Negative for abdominal pain, blood in stool, constipation, diarrhea, nausea and vomiting.  Genitourinary: Negative for dysuria.  Musculoskeletal: Negative for falls and myalgias.  Skin: Negative for rash.  Neurological: Negative for dizziness.  Psychiatric/Behavioral: Positive for depression. Negative for hallucinations, memory loss, substance abuse and suicidal ideas. The patient is nervous/anxious  and has insomnia.       Past Medical History:  Diagnosis Date  . Abnormal Pap smear AGE 45   COLPO LAST PAP 08/2011  . Allergy   . Anemia   . Complication of anesthesia    NAUSEA; VOMITING; ITCHING; DIFFICULTY BREATHING  . Genital herpes   . GERD (gastroesophageal reflux disease) AGE 74   tums prn  . History of chicken pox   . Hypertension   . Infection AGE 69   CHLAMYDIA; GONORRHEA  . 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 Infection    BV  . Migraines   . Ovarian cyst rupture    . PONV (postoperative nausea and vomiting)   . Preterm labor   . Urinary incontinence     reports that she has never smoked. She has never used smokeless tobacco. She reports current alcohol use of about 3.0 standard drinks of alcohol per week. She reports that she does not use drugs.   Current Outpatient Medications:  .  amLODipine (NORVASC) 10 MG tablet, TAKE 1 TABLET BY MOUTH  DAILY, Disp: 90 tablet, Rfl: 3 .  cyclobenzaprine (FLEXERIL) 10 MG tablet, TAKE 1 TABLET (10 MG TOTAL) BY MOUTH AT BEDTIME AS NEEDED FOR MUSCLE SPASMS., Disp: 15 tablet, Rfl: 0 .  EMGALITY 120 MG/ML SOAJ, INJECT 120MG  INTO THE SKIN EVERY 30 DAYS, Disp: 1 pen, Rfl: 11 .  FLUoxetine (PROZAC) 20 MG tablet, Take 2 tablets (40 mg total) by mouth daily., Disp: 60 tablet, Rfl: 3 .  hydrOXYzine (ATARAX/VISTARIL) 10 MG tablet, Take 1 tablet (10 mg total) by mouth 3 (three) times daily as needed for anxiety., Disp: 30 tablet, Rfl: 0 .  losartan (COZAAR) 100 MG tablet, Take 1 tablet (100 mg total) by mouth daily. Please make overdue appt with Dr. Meda Coffee before anymore refills. 2nd attempt, Disp: 15 tablet, Rfl: 0 .  metoprolol succinate (TOPROL-XL) 25 MG 24 hr tablet, TAKE 1 TABLET BY MOUTH  DAILY, Disp: 90 tablet, Rfl: 1 .  pantoprazole (PROTONIX) 40 MG tablet, Take 40 mg by mouth daily., Disp: , Rfl:  .  potassium chloride SA (K-DUR) 20 MEQ tablet, Every, Su,M,W, F, Disp: 64 tablet, Rfl: 3 .  promethazine (PHENERGAN) 25 MG tablet, Take 1 tablet (25 mg total) by mouth every 6 (six) hours as needed for nausea or vomiting., Disp: 30 tablet, Rfl: 0 .  tolterodine (DETROL LA) 4 MG 24 hr capsule, TAKE 1 CAPSULE BY MOUTH  DAILY, Disp: 90 capsule, Rfl: 3 .  valACYclovir (VALTREX) 1000 MG tablet, Take 1 tablet (1,000 mg total) by mouth 2 (two) times daily., Disp: 20 tablet, Rfl: 0   Observations/Objective: Blood pressure 112/78, pulse 77, temperature 97.9 F (36.6 C), temperature source Oral, height 5' 3.25" (1.607 m), weight 154 lb  (69.9 kg).  Physical Exam  Physical Exam Constitutional:      General: The patient is not in acute distress. Pulmonary:     Effort: Pulmonary effort is normal. No respiratory distress.  Neurological:     Mental Status: The patient is alert and oriented to person, place, and time.  Psychiatric:        Mood and Affect: Mood improved some.. smiling more when talking  More positive ouitlook.        Behavior: Behavior normal.   Assessment and Plan Current moderate episode of major depressive disorder without prior episode ([place]) Improving with higher dose of prozac and counseling.  Has only been on higher dose 2 weeks.. takes 4 weeks  before max improvement. She is still only 50% improvement.  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 Extend leave 2 more weeks.Marland Kitchen re-eval prior to return. Call to schedule follow up appt in 2 weeks.      I discussed the assessment and treatment plan with the patient. The patient was provided an opportunity to ask questions and all were answered. The patient agreed with the plan and demonstrated an understanding of the instructions.   The patient was advised to call back or seek an in-person evaluation if the symptoms worsen or if the condition fails to improve as anticipated.     [name], MD

## 2019-05-16 NOTE — Assessment & Plan Note (Addendum)
Improving with higher dose of prozac and counseling.  Has only been on higher dose 2 weeks.. takes 4 weeks before max improvement. She is still only 50% improvement.  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 Extend leave 2 more weeks.Marland Kitchen re-eval prior to return. Call to schedule follow up appt in 2 weeks.

## 2019-05-17 ENCOUNTER — Telehealth: Payer: Self-pay | Admitting: Family Medicine

## 2019-05-17 NOTE — Telephone Encounter (Signed)
Attending physician statement in dr bedsole's in box for review and signature

## 2019-05-17 NOTE — Patient Instructions (Signed)
Needed to fill out new attending statement in dr Ermalene Searing in box for review and signature 05/17/19/rbh

## 2019-05-22 NOTE — Telephone Encounter (Signed)
Paperwork faxed °Copy for pt °Copy for scan °

## 2019-05-24 ENCOUNTER — Ambulatory Visit (INDEPENDENT_AMBULATORY_CARE_PROVIDER_SITE_OTHER): Payer: 59 | Admitting: Psychology

## 2019-05-24 DIAGNOSIS — F4321 Adjustment disorder with depressed mood: Secondary | ICD-10-CM

## 2019-05-24 DIAGNOSIS — F411 Generalized anxiety disorder: Secondary | ICD-10-CM | POA: Diagnosis not present

## 2019-05-30 ENCOUNTER — Other Ambulatory Visit: Payer: Self-pay

## 2019-05-30 ENCOUNTER — Ambulatory Visit (INDEPENDENT_AMBULATORY_CARE_PROVIDER_SITE_OTHER): Payer: Managed Care, Other (non HMO) | Admitting: Family Medicine

## 2019-05-30 ENCOUNTER — Encounter: Payer: Self-pay | Admitting: Family Medicine

## 2019-05-30 VITALS — Ht 63.25 in

## 2019-05-30 DIAGNOSIS — F99 Mental disorder, not otherwise specified: Secondary | ICD-10-CM | POA: Diagnosis not present

## 2019-05-30 DIAGNOSIS — F321 Major depressive disorder, single episode, moderate: Secondary | ICD-10-CM | POA: Diagnosis not present

## 2019-05-30 DIAGNOSIS — F411 Generalized anxiety disorder: Secondary | ICD-10-CM

## 2019-05-30 DIAGNOSIS — F5105 Insomnia due to other mental disorder: Secondary | ICD-10-CM | POA: Insufficient documentation

## 2019-05-30 MED ORDER — TRAZODONE HCL 50 MG PO TABS: 25.0000 mg | ORAL_TABLET | Freq: Every evening | ORAL | 3 refills | Status: DC | PRN

## 2019-05-30 NOTE — Assessment & Plan Note (Signed)
Slow improvement in mood. Continuing counseling and high dose fluoxetine.  Pt increasing exercsie and try to leave house more.  Had panic attack and regression after trying to eat at restaurant.

## 2019-05-30 NOTE — Assessment & Plan Note (Signed)
Trial of trazodone in place of melatonin for sleep.

## 2019-05-30 NOTE — Patient Instructions (Signed)
Trail of trazodone in addition to SSRI for insomnia/early morning waking.

## 2019-05-30 NOTE — Progress Notes (Signed)
VIRTUAL VISIT Due to national recommendations of social distancing due to COVID 19, a virtual visit is felt to be most appropriate for this patient at this time.   I connected with the patient on 05/30/19 at 11:00 AM EST by virtual telehealth platform and verified that I am speaking with the correct person using two identifiers.   I discussed the limitations, risks, security and privacy concerns of performing an evaluation and management service by  virtual telehealth platform and the availability of in person appointments. I also discussed with the patient that there may be a patient responsible charge related to this service. The patient expressed understanding and agreed to proceed.  Patient location: Home Provider Location: [facility] Participants: [name] and [name]   Chief Complaint  Patient presents with  . Follow-up    GAD/MDD    History of Present Illness:    34 year old female presents for follow up GAD,MDD.   Today  she reports after 4 weeks on 40 mg prozac daily... she continues to have improvement in her mood.  Mother feels that her mood is heading in the right direction.   She has been trying to increase time outside, doing more walking   She is still anxious when out at restaurant.  Socializing is still difficult. She is unable to focus.  Decreased appetite still remains.  She is still having some trouble early morning waking.Marland Kitchen  occ able to go back to sleep.wakes up groggy.  She is using melatonin off and on..  She is seeing counselor every other week.  GAD 7 : Generalized Anxiety Score 05/30/2019 05/16/2019 04/28/2019 04/13/2019  Nervous, Anxious, on Edge 1 1 1 3   Control/stop worrying 1 1 1 3   Worry too much - different things 1 1 1 3   Trouble relaxing 1 1 1 2   Restless 0 1 1 1   Easily annoyed or irritable 1 1 1 1   Afraid - awful might happen 1 1 1 3   Total GAD 7 Score 6 7 7 16   Anxiety Difficulty Somewhat difficult Somewhat  difficult Somewhat difficult Extremely difficult     PHQ9 SCORE ONLY 05/30/2019 05/16/2019 04/28/2019  Score 8 6 7        COVID 19 screen No recent travel or known exposure to COVID19 The patient denies respiratory symptoms of COVID 19 at this time.  The importance of social distancing was discussed today.   ROS    Past Medical History:  Diagnosis Date  . Abnormal Pap smear AGE 49   COLPO LAST PAP 08/2011  . Allergy   . Anemia   . Complication of anesthesia    NAUSEA; VOMITING; ITCHING; DIFFICULTY BREATHING  . Genital herpes   . GERD (gastroesophageal reflux disease) AGE 25   tums prn  . History of chicken pox   . Hypertension   . Infection AGE 5   CHLAMYDIA; GONORRHEA  . 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 Infection    BV  . Migraines   . Ovarian cyst rupture   . PONV (postoperative nausea and vomiting)   . Preterm labor   . Urinary incontinence     reports that she has never smoked. She has never used smokeless tobacco. She reports current alcohol use of about 3.0 standard drinks of alcohol per week. She reports that she does not use drugs.   Current Outpatient Medications:  .  amLODipine (NORVASC) 10 MG tablet, TAKE 1 TABLET BY MOUTH  DAILY, Disp: 90  tablet, Rfl: 3 .  cyclobenzaprine (FLEXERIL) 10 MG tablet, TAKE 1 TABLET (10 MG TOTAL) BY MOUTH AT BEDTIME AS NEEDED FOR MUSCLE SPASMS., Disp: 15 tablet, Rfl: 0 .  EMGALITY 120 MG/ML SOAJ, INJECT 120MG  INTO THE SKIN EVERY 30 DAYS, Disp: 1 pen, Rfl: 11 .  FLUoxetine (PROZAC) 20 MG tablet, Take 2 tablets (40 mg total) by mouth daily., Disp: 60 tablet, Rfl: 3 .  hydrOXYzine (ATARAX/VISTARIL) 10 MG tablet, Take 1 tablet (10 mg total) by mouth 3 (three) times daily as needed for anxiety., Disp: 30 tablet, Rfl: 0 .  losartan (COZAAR) 100 MG tablet, Take 1 tablet (100 mg total) by mouth daily. Please make overdue appt with Dr. Meda Coffee before anymore refills. 2nd attempt, Disp: 15 tablet, Rfl: 0 .  metoprolol succinate (TOPROL-XL) 25 MG 24  hr tablet, TAKE 1 TABLET BY MOUTH  DAILY, Disp: 90 tablet, Rfl: 1 .  pantoprazole (PROTONIX) 40 MG tablet, Take 40 mg by mouth daily., Disp: , Rfl:  .  potassium chloride SA (K-DUR) 20 MEQ tablet, Every, Su,M,W, F, Disp: 64 tablet, Rfl: 3 .  promethazine (PHENERGAN) 25 MG tablet, Take 1 tablet (25 mg total) by mouth every 6 (six) hours as needed for nausea or vomiting., Disp: 30 tablet, Rfl: 0 .  tolterodine (DETROL LA) 4 MG 24 hr capsule, TAKE 1 CAPSULE BY MOUTH  DAILY, Disp: 90 capsule, Rfl: 3 .  valACYclovir (VALTREX) 1000 MG tablet, Take 1 tablet (1,000 mg total) by mouth 2 (two) times daily., Disp: 20 tablet, Rfl: 0   Observations/Objective: Height 5' 3.25" (1.607 m).  Physical Exam  Physical Exam Constitutional:      General: The patient is not in acute distress. Pulmonary:     Effort: Pulmonary effort is normal. No respiratory distress.  Neurological:     Mental Status: The patient is alert and oriented to person, place, and time.  Psychiatric:        Mood and Affect: Mood normal.        Behavior: Behavior normal.   Assessment and Plan   Insomnia due to other mental disorder Trial of trazodone in place of melatonin for sleep.  Current moderate episode of major depressive disorder without prior episode ([facility]) Slow improvement in mood. Continuing counseling and high dose fluoxetine.  Pt increasing exercsie and try to leave house more.  Had panic attack and regression after trying to eat at restaurant.  GAD (generalized anxiety disorder) Slow improvement with SSRI.   I discussed the assessment and treatment plan with the patient. The patient was provided an opportunity to ask questions and all were answered. The patient agreed with the plan and demonstrated an understanding of the instructions.   The patient was advised to call back or seek an in-person evaluation if the symptoms worsen or if the condition fails to improve as anticipated.     Eliezer Lofts, MD

## 2019-05-30 NOTE — Assessment & Plan Note (Signed)
Slow improvement with SSRI.

## 2019-06-01 NOTE — Progress Notes (Signed)
Mental health evaluation paperwork from [employer] in dr Ermalene Searing in box for review and signature 06/01/19

## 2019-06-02 DIAGNOSIS — Z0279 Encounter for issue of other medical certificate: Secondary | ICD-10-CM

## 2019-06-09 ENCOUNTER — Ambulatory Visit (INDEPENDENT_AMBULATORY_CARE_PROVIDER_SITE_OTHER): Payer: 59 | Admitting: Psychology

## 2019-06-09 DIAGNOSIS — F4321 Adjustment disorder with depressed mood: Secondary | ICD-10-CM

## 2019-06-09 DIAGNOSIS — F411 Generalized anxiety disorder: Secondary | ICD-10-CM | POA: Diagnosis not present

## 2019-06-13 ENCOUNTER — Encounter: Payer: Self-pay | Admitting: Family Medicine

## 2019-06-13 ENCOUNTER — Other Ambulatory Visit: Payer: Self-pay

## 2019-06-13 ENCOUNTER — Ambulatory Visit (INDEPENDENT_AMBULATORY_CARE_PROVIDER_SITE_OTHER): Payer: Managed Care, Other (non HMO) | Admitting: Family Medicine

## 2019-06-13 VITALS — Temp 97.9°F | Ht 63.0 in | Wt 157.0 lb

## 2019-06-13 DIAGNOSIS — F411 Generalized anxiety disorder: Secondary | ICD-10-CM | POA: Diagnosis not present

## 2019-06-13 DIAGNOSIS — F410 Panic disorder [episodic paroxysmal anxiety] without agoraphobia: Secondary | ICD-10-CM

## 2019-06-13 DIAGNOSIS — F321 Major depressive disorder, single episode, moderate: Secondary | ICD-10-CM | POA: Diagnosis not present

## 2019-06-13 NOTE — Assessment & Plan Note (Signed)
Improving control with Prozac. Pt will continue current dose.  She plans on return to work.  Plan continued SSRI for 6-9 months.  Can use atarax prn. Continue stress reduction and relaxation techniques.

## 2019-06-13 NOTE — Assessment & Plan Note (Signed)
Improving control .. no red flags.

## 2019-06-13 NOTE — Progress Notes (Signed)
VIRTUAL VISIT Due to national recommendations of social distancing due to COVID 19, a virtual visit is felt to be most appropriate for this patient at this time.   I connected with the patient on 06/13/19 at 11:40 AM EST by virtual telehealth platform and verified that I am speaking with the correct person using two identifiers.   I discussed the limitations, risks, security and privacy concerns of performing an evaluation and management service by  virtual telehealth platform and the availability of in person appointments. I also discussed with the patient that there may be a patient responsible charge related to this service. The patient expressed understanding and agreed to proceed.  Patient location: Home Provider Location: [facility] Participants: [name] and [name]   Chief Complaint  Patient presents with  . Follow-up  . Depression  . Anxiety    History of Present Illness:  34 year old female presents for follow up  Generalized anxiety, major depressive disorder and  panic attacks.   She reports she is now sleeping some better.  She is on prozac 40 mg daily ( now on higher dose 1.5 months) and  she is going to counseling.  She is feeling much better overall, she is making progress.  She is feeling 75% better at this time. Since last OV she was given trazodone for sleep but she has not needed it . She is sleeping better overall and waking feeling more rested.  She has not had to use atarax for anxiety.   Her FMLA claim: short term disability continue but job protection ends now.  GAD 7 : Generalized Anxiety Score 06/13/2019 05/30/2019 05/16/2019 04/28/2019  Nervous, Anxious, on Edge 1 1 1 1   Control/stop worrying 1 1 1 1   Worry too much - different things 1 1 1 1   Trouble relaxing 1 1 1 1   Restless 1 0 1 1  Easily annoyed or irritable 1 1 1 1   Afraid - awful might happen 1 1 1 1   Total GAD 7 Score 7 6 7 7   Anxiety Difficulty Somewhat difficult  Somewhat difficult Somewhat difficult Somewhat difficult    Depression screen [facility] 2/9 06/13/2019 05/30/2019 05/16/2019 04/28/2019 04/13/2019  Decreased Interest 1 1 1 1  0  Down, Depressed, Hopeless 1 1 1 1 2   PHQ - 2 Score 2 2 2 2 2   Altered sleeping 1 2 1 1 3   Tired, decreased energy 1 2 1 2 2   Change in appetite 1 1 1 1 3   Feeling bad or failure about yourself  0 0 0 0 0  Trouble concentrating 1 1 1  - 2  Moving slowly or fidgety/restless 0 0 0 1 0  Suicidal thoughts 0 0 0 0 0  PHQ-9 Score 6 8 6 7 12   Difficult doing work/chores Somewhat difficult Somewhat difficult Somewhat difficult Somewhat difficult Extremely dIfficult   COVID 19 screen No recent travel or known exposure to COVID19 The patient denies respiratory symptoms of COVID 19 at this time.  The importance of social distancing was discussed today.   Review of Systems  Constitutional: Negative for chills and fever.  HENT: Negative for congestion and ear pain.   Eyes: Negative for pain and redness.  Respiratory: Negative for cough and shortness of breath.   Cardiovascular: Negative for chest pain, palpitations and leg swelling.  Gastrointestinal: Negative for abdominal pain, blood in stool, constipation, diarrhea, nausea and vomiting.  Genitourinary: Negative for dysuria.  Musculoskeletal: Negative for falls and myalgias.  Skin: Negative for rash.  Neurological: Negative for dizziness.  Psychiatric/Behavioral: Positive for depression. Negative for hallucinations, memory loss, substance abuse and suicidal ideas. The patient is nervous/anxious and has insomnia.       Past Medical History:  Diagnosis Date  . Abnormal Pap smear AGE 28   COLPO LAST PAP 08/2011  . Allergy   . Anemia   . Complication of anesthesia    NAUSEA; VOMITING; ITCHING; DIFFICULTY BREATHING  . Genital herpes   . GERD (gastroesophageal reflux disease) AGE 69   tums prn  . History of chicken pox   . Hypertension   . Infection AGE 37   CHLAMYDIA;  GONORRHEA  . 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 Infection    BV  . Migraines   . Ovarian cyst rupture   . PONV (postoperative nausea and vomiting)   . Preterm labor   . Urinary incontinence     reports that she has never smoked. She has never used smokeless tobacco. She reports current alcohol use of about 3.0 standard drinks of alcohol per week. She reports that she does not use drugs.   Current Outpatient Medications:  .  amLODipine (NORVASC) 10 MG tablet, TAKE 1 TABLET BY MOUTH  DAILY, Disp: 90 tablet, Rfl: 3 .  EMGALITY 120 MG/ML SOAJ, INJECT 120MG  INTO THE SKIN EVERY 30 DAYS, Disp: 1 pen, Rfl: 11 .  FLUoxetine (PROZAC) 20 MG tablet, Take 2 tablets (40 mg total) by mouth daily., Disp: 60 tablet, Rfl: 3 .  hydrOXYzine (ATARAX/VISTARIL) 10 MG tablet, Take 1 tablet (10 mg total) by mouth 3 (three) times daily as needed for anxiety., Disp: 30 tablet, Rfl: 0 .  losartan (COZAAR) 100 MG tablet, Take 1 tablet (100 mg total) by mouth daily. Please make overdue appt with Dr. Meda Coffee before anymore refills. 2nd attempt, Disp: 15 tablet, Rfl: 0 .  metoprolol succinate (TOPROL-XL) 25 MG 24 hr tablet, TAKE 1 TABLET BY MOUTH  DAILY, Disp: 90 tablet, Rfl: 1 .  pantoprazole (PROTONIX) 40 MG tablet, Take 40 mg by mouth daily., Disp: , Rfl:  .  potassium chloride SA (K-DUR) 20 MEQ tablet, Every, Su,M,W, F, Disp: 64 tablet, Rfl: 3 .  tolterodine (DETROL LA) 4 MG 24 hr capsule, TAKE 1 CAPSULE BY MOUTH  DAILY, Disp: 90 capsule, Rfl: 3 .  traZODone (DESYREL) 50 MG tablet, Take 0.5-1 tablets (25-50 mg total) by mouth at bedtime as needed for sleep., Disp: 30 tablet, Rfl: 3 .  valACYclovir (VALTREX) 1000 MG tablet, Take 1 tablet (1,000 mg total) by mouth 2 (two) times daily., Disp: 20 tablet, Rfl: 0 .  cyclobenzaprine (FLEXERIL) 10 MG tablet, TAKE 1 TABLET (10 MG TOTAL) BY MOUTH AT BEDTIME AS NEEDED FOR MUSCLE SPASMS. (Patient not taking: Reported on 06/13/2019), Disp: 15 tablet, Rfl: 0 .  promethazine (PHENERGAN) 25 MG  tablet, Take 1 tablet (25 mg total) by mouth every 6 (six) hours as needed for nausea or vomiting. (Patient not taking: Reported on 06/13/2019), Disp: 30 tablet, Rfl: 0   Observations/Objective: Temperature 97.9 F (36.6 C), height 5\' 3"  (1.6 m), weight 157 lb (71.2 kg).  Physical Exam  Physical Exam Constitutional:      General: The patient is not in acute distress. Pulmonary:     Effort: Pulmonary effort is normal. No respiratory distress.  Neurological:     Mental Status: The patient is alert and oriented to person, place, and time.  Psychiatric:        Mood and  Affect: Mood normal.        Behavior: Behavior normal.   Assessment and Plan   GAD (generalized anxiety disorder) Improving control with Prozac. Pt will continue current dose.  She plans on return to work.  Plan continued SSRI for 6-9 months.  Can use atarax prn. Continue stress reduction and relaxation techniques.  Current moderate episode of major depressive disorder without prior episode (H[facility]) Improving control .. no red flags.   I discussed the assessment and treatment plan with the patient. The patient was provided an opportunity to ask questions and all were answered. The patient agreed with the plan and demonstrated an understanding of the instructions.   The patient was advised to call back or seek an in-person evaluation if the symptoms worsen or if the condition fails to improve as anticipated.     [name], MD

## 2019-06-14 ENCOUNTER — Telehealth: Payer: Self-pay | Admitting: Family Medicine

## 2019-06-14 MED ORDER — FLUOXETINE HCL 20 MG PO TABS: 40.0000 mg | ORAL_TABLET | Freq: Every day | ORAL | 5 refills | Status: DC

## 2019-06-14 NOTE — Addendum Note (Signed)
Addended by: Damita Lack on: 06/14/2019 12:14 PM   Modules accepted: Orders

## 2019-06-14 NOTE — Telephone Encounter (Signed)
Refills sent to [facility] [address]

## 2019-06-14 NOTE — Telephone Encounter (Signed)
Pt stated pharmacy needed another rx.  Cannot use last one sent in dec

## 2019-06-15 DIAGNOSIS — Z0279 Encounter for issue of other medical certificate: Secondary | ICD-10-CM

## 2019-06-15 NOTE — Telephone Encounter (Signed)
Form 1  Mental health evaluation form Form 2 accommodation medical assessment form   Both forms are in dr bedsole's in box for review and signature

## 2019-06-16 ENCOUNTER — Other Ambulatory Visit: Payer: Self-pay | Admitting: Physician Assistant

## 2019-06-19 NOTE — Telephone Encounter (Signed)
Pt aware °Copy for pt °Copy for scan °Copy for billing °

## 2019-06-21 ENCOUNTER — Ambulatory Visit (INDEPENDENT_AMBULATORY_CARE_PROVIDER_SITE_OTHER): Payer: 59 | Admitting: Psychology

## 2019-06-21 ENCOUNTER — Other Ambulatory Visit: Payer: Self-pay | Admitting: Family Medicine

## 2019-06-21 DIAGNOSIS — F4321 Adjustment disorder with depressed mood: Secondary | ICD-10-CM

## 2019-06-23 ENCOUNTER — Telehealth: Payer: Self-pay | Admitting: Family Medicine

## 2019-06-23 NOTE — Telephone Encounter (Signed)
Left message asking pt to call office Regarding paperwork from [employer] I spoke with them this morning they stated leave has been extended to 2/25

## 2019-06-23 NOTE — Telephone Encounter (Signed)
Spoke with [employer] they stated leave was extended to 07/07/19.  I also spoke with pt she stated leave till 07/06/19 return to work 07/07/19. She stated HR declined her job accommodations  Please initial and date changes marked  Paperwork in dr [employer] in box

## 2019-06-23 NOTE — Telephone Encounter (Signed)
Completed.

## 2019-06-26 NOTE — Telephone Encounter (Signed)
Paperwork faxed °

## 2019-07-03 NOTE — Telephone Encounter (Signed)
Pt aware °Copy for scan °Copy for pt °

## 2019-07-05 NOTE — Telephone Encounter (Signed)
Please give pt a return to work note  To return without restrictions 07/07/2019.

## 2019-07-06 ENCOUNTER — Ambulatory Visit (INDEPENDENT_AMBULATORY_CARE_PROVIDER_SITE_OTHER): Payer: 59 | Admitting: Psychology

## 2019-07-06 DIAGNOSIS — F411 Generalized anxiety disorder: Secondary | ICD-10-CM | POA: Diagnosis not present

## 2019-07-06 DIAGNOSIS — F4321 Adjustment disorder with depressed mood: Secondary | ICD-10-CM | POA: Diagnosis not present

## 2019-07-14 ENCOUNTER — Other Ambulatory Visit: Payer: Self-pay | Admitting: Family Medicine

## 2019-07-14 DIAGNOSIS — R112 Nausea with vomiting, unspecified: Secondary | ICD-10-CM

## 2019-07-15 MED ORDER — PROMETHAZINE HCL 25 MG PO TABS: 25.0000 mg | ORAL_TABLET | Freq: Four times a day (QID) | ORAL | 2 refills | Status: DC | PRN

## 2019-07-18 ENCOUNTER — Ambulatory Visit (INDEPENDENT_AMBULATORY_CARE_PROVIDER_SITE_OTHER): Payer: 59 | Admitting: Psychology

## 2019-07-18 DIAGNOSIS — F411 Generalized anxiety disorder: Secondary | ICD-10-CM | POA: Diagnosis not present

## 2019-07-18 DIAGNOSIS — F4321 Adjustment disorder with depressed mood: Secondary | ICD-10-CM

## 2019-08-02 ENCOUNTER — Telehealth: Payer: Self-pay | Admitting: Family Medicine

## 2019-08-02 ENCOUNTER — Ambulatory Visit (INDEPENDENT_AMBULATORY_CARE_PROVIDER_SITE_OTHER): Payer: 59 | Admitting: Psychology

## 2019-08-02 DIAGNOSIS — F4321 Adjustment disorder with depressed mood: Secondary | ICD-10-CM

## 2019-08-02 DIAGNOSIS — R1011 Right upper quadrant pain: Secondary | ICD-10-CM

## 2019-08-02 DIAGNOSIS — F411 Generalized anxiety disorder: Secondary | ICD-10-CM | POA: Diagnosis not present

## 2019-08-02 NOTE — Telephone Encounter (Signed)
Left message asking pt to call office [employer]  See robin

## 2019-08-02 NOTE — Telephone Encounter (Signed)
FMLA paperwork In dr Ermalene Searing in box for review and signature

## 2019-08-04 DIAGNOSIS — Z0279 Encounter for issue of other medical certificate: Secondary | ICD-10-CM

## 2019-08-09 NOTE — Telephone Encounter (Signed)
Pt aware °Copy for pt °Copy for scan °Copy for billing °

## 2019-08-11 ENCOUNTER — Ambulatory Visit
Admission: RE | Admit: 2019-08-11 | Discharge: 2019-08-11 | Disposition: A | Discharge status: Home / Self Care | Payer: Managed Care, Other (non HMO) | Source: Ambulatory Visit | Attending: Family Medicine | Admitting: Family Medicine

## 2019-08-11 DIAGNOSIS — R1011 Right upper quadrant pain: Secondary | ICD-10-CM

## 2019-08-16 ENCOUNTER — Ambulatory Visit (INDEPENDENT_AMBULATORY_CARE_PROVIDER_SITE_OTHER): Payer: 59 | Admitting: Psychology

## 2019-08-16 DIAGNOSIS — F4321 Adjustment disorder with depressed mood: Secondary | ICD-10-CM

## 2019-08-16 DIAGNOSIS — F411 Generalized anxiety disorder: Secondary | ICD-10-CM | POA: Diagnosis not present

## 2019-09-04 ENCOUNTER — Ambulatory Visit (INDEPENDENT_AMBULATORY_CARE_PROVIDER_SITE_OTHER)
Admission: RE | Admit: 2019-09-04 | Discharge: 2019-09-04 | Disposition: A | Discharge status: Home / Self Care | Payer: Managed Care, Other (non HMO) | Source: Ambulatory Visit

## 2019-09-04 DIAGNOSIS — R21 Rash and other nonspecific skin eruption: Secondary | ICD-10-CM | POA: Diagnosis not present

## 2019-09-04 NOTE — Discharge Instructions (Addendum)
Appearance of rash right now looks like contact dermatitis. Ice compress, over the counter cortisone cream sparingly, hydroxyzine/zyrtec as directed. If noticing more blisters forming with burning/itching, will need reevaluation. If noticing spreading redness, warmth, fever, will need reevaluation.  [facility] at [facility]) [address] [place], [place], [place] [address] [phone]  [facility] at [facility] [address] Daryel Gerald [place], [place] [address] [phone]  [facility] at [facility] [address] [place], [place] [address] [phone]  [facility] at [facility] [address], [place], [place] [address] [phone]  [facility] at [facility] [address] Evonnie Dawes [place], [place] [address] [phone]  [facility] at [facility] [address], [place], [place] [address] [phone]

## 2019-09-04 NOTE — ED Provider Notes (Signed)
Virtual Visit via Video Note:  Angela [name]  initiated request for Telemedicine visit with [facility] team. I connected with Angela [name]  on 09/04/2019 at 6:29 PM  for a synchronized telemedicine visit using a video enabled HIPPA compliant telemedicine application. I verified that I am speaking with Angela [name]  using two identifiers. Amit Leece Jodell Cipro, PA-C  was physically located in a [facility] Urgent care site and Angela [name] was located at a different location.   The limitations of evaluation and management by telemedicine as well as the availability of in-person appointments were discussed. Patient was informed that she  may incur a bill ( including co-pay) for this virtual visit encounter. Angela [name]  expressed understanding and gave verbal consent to proceed with virtual visit.     History of Present Illness:Angela [name]  is a 34 y.o. female presents with 4-5 day history of rash to the left forehead. Had thought this was due to an insect bite. Noticed blistering starting yesterday. Denies burning, itching, pain. No spreading. Has applied bacitracin.    Past Medical History:  Diagnosis Date  . Abnormal Pap smear AGE 84   COLPO LAST PAP 08/2011  . Allergy   . Anemia   . Complication of anesthesia    NAUSEA; VOMITING; ITCHING; DIFFICULTY BREATHING  . Genital herpes   . GERD (gastroesophageal reflux disease) AGE 80   tums prn  . History of chicken pox   . Hypertension   . Infection AGE 89   CHLAMYDIA; GONORRHEA  . 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 Infection    BV  . Migraines   . Ovarian cyst rupture   . PONV (postoperative nausea and vomiting)   . Preterm labor   . Urinary incontinence     Allergies  Allergen Reactions  . Amoxicillin Hives    Has patient had a PCN reaction causing immediate rash, facial/tongue/throat swelling, SOB or lightheadedness with hypotension: No Has patient had a PCN reaction causing severe rash  involving mucus membranes or skin necrosis: Yes Has patient had a PCN reaction that required hospitalization No Has patient had a PCN reaction occurring within the last 10 years: No If all of the above answers are "NO", then may proceed with Cephalosporin use.   Marland Kitchen Dexilant [Dexlansoprazole] Nausea Only and Other (See Comments)    dizziness        Observations/Objective: General: Well appearing, nontoxic, no acute distress. Sitting comfortably. Head: Normocephalic, atraumatic Eye: No conjunctival injection, eyelid swelling. EOMI ENT: Mucus membranes moist, no lip cracking. No obvious nasal drainage. Pulm: Speaking in full sentences without difficulty. Normal effort. No respiratory distress, accessory muscle use. Skin: 3-4 vesicular rash to the left forehead, directly superior to left eyebrow. No surrounding erythema. No warmth. No tenderness to self palpation.  Neuro: Normal mental status. Alert and oriented x 3.  Assessment and Plan: Low suspicion for herpes zoster at this time. Discussed history and exam more consistent with insect bite/contact dermatitis. Given rash on the face, discussed worries for discoloration with corticosteroid cream. Can use otc cortisone sparingly if needed. Otherwise, ice compress and monitor. Return precautions given. Patient expresses understanding and agrees to plan.  Follow Up Instructions:    I discussed the assessment and treatment plan with the patient. The patient was provided an opportunity to ask questions and all were answered. The patient agreed with the plan and demonstrated an understanding of the instructions.   The patient  was advised to call back or seek an in-person evaluation if the symptoms worsen or if the condition fails to improve as anticipated.  I provided 13 minutes of non-face-to-face time during this encounter.    Belinda Fisher, PA-C  09/04/2019 6:29 PM         Belinda Fisher, PA-C 09/04/19 1846

## 2019-09-05 ENCOUNTER — Encounter: Payer: Self-pay | Admitting: Family Medicine

## 2019-09-05 ENCOUNTER — Other Ambulatory Visit: Payer: Self-pay

## 2019-09-05 ENCOUNTER — Ambulatory Visit (INDEPENDENT_AMBULATORY_CARE_PROVIDER_SITE_OTHER): Payer: Managed Care, Other (non HMO) | Admitting: Family Medicine

## 2019-09-05 VITALS — BP 128/75 | HR 83 | Temp 99.9°F | Ht 63.0 in | Wt 163.5 lb

## 2019-09-05 DIAGNOSIS — R590 Localized enlarged lymph nodes: Secondary | ICD-10-CM | POA: Insufficient documentation

## 2019-09-05 DIAGNOSIS — R21 Rash and other nonspecific skin eruption: Secondary | ICD-10-CM | POA: Insufficient documentation

## 2019-09-05 MED ORDER — PREDNISONE 10 MG PO TABS: ORAL_TABLET | ORAL | 0 refills | Status: DC

## 2019-09-05 MED ORDER — CEPHALEXIN 500 MG PO CAPS
500.0000 mg | ORAL_CAPSULE | Freq: Three times a day (TID) | ORAL | 0 refills | Status: DC
Start: 2019-09-05 — End: 2019-09-08

## 2019-09-05 NOTE — Patient Instructions (Signed)
Complete prednisone and antibiotics.

## 2019-09-05 NOTE — Assessment & Plan Note (Signed)
Contact derm vs bacterial infeciton... less likely shingles given no pain.  Treat with pred taper and antibtioics .  Close follow up in 3 days for improvement.

## 2019-09-05 NOTE — Progress Notes (Signed)
Chief Complaint  Patient presents with  . Insect Bite    forehead-E-Visit yesterday  . Lymphadenopathy    Under Left Ear    History of Present Illness: HPI    34 year old female presents following rash on  Face, left forehead.. presents x 5 days now.  At first she felt it may have followed insect bite... felt sudden onset tingle in left forehead.  Felt likly contact derm vs insect bite per Evisit... told to use ice and cortisone 10 but has not applied med yet.   Not itchy, minimal pain  She noted lymph node behind left ear swollen, mildly tender.  She has now noted more red bumps in eyebrow and at top of forehead.  No fever.  No lip swelling, no tounge swelling , no SOB.   Amox allergy.. but no severe reaction only hives , no issue in last 10 years.  This visit occurred during the SARS-CoV-2 public health emergency.  Safety protocols were in place, including screening questions prior to the visit, additional usage of staff PPE, and extensive cleaning of exam room while observing appropriate contact time as indicated for disinfecting solutions.   COVID 19 screen:  No recent travel or known exposure to COVID19 The patient denies respiratory symptoms of COVID 19 at this time. The importance of social distancing was discussed today.     Review of Systems  Constitutional: Negative for chills and fever.  HENT: Negative for congestion and ear pain.   Eyes: Negative for pain and redness.  Respiratory: Negative for cough and shortness of breath.   Cardiovascular: Negative for chest pain, palpitations and leg swelling.  Gastrointestinal: Negative for abdominal pain, blood in stool, constipation, diarrhea, nausea and vomiting.  Genitourinary: Negative for dysuria.  Musculoskeletal: Negative for falls and myalgias.  Skin: Positive for rash.  Neurological: Negative for dizziness.  Psychiatric/Behavioral: Negative for depression. The patient is not nervous/anxious.       Past  Medical History:  Diagnosis Date  . Abnormal Pap smear AGE 17   COLPO LAST PAP 08/2011  . Allergy   . Anemia   . Complication of anesthesia    NAUSEA; VOMITING; ITCHING; DIFFICULTY BREATHING  . Genital herpes   . GERD (gastroesophageal reflux disease) AGE 40   tums prn  . History of chicken pox   . Hypertension   . Infection AGE 12   CHLAMYDIA; GONORRHEA  . 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 Infection    BV  . Migraines   . Ovarian cyst rupture   . PONV (postoperative nausea and vomiting)   . Preterm labor   . Urinary incontinence     reports that she has never smoked. She has never used smokeless tobacco. She reports current alcohol use of about 3.0 standard drinks of alcohol per week. She reports that she does not use drugs.   Current Outpatient Medications:  .  amLODipine (NORVASC) 10 MG tablet, TAKE 1 TABLET BY MOUTH  DAILY, Disp: 90 tablet, Rfl: 3 .  cyclobenzaprine (FLEXERIL) 10 MG tablet, TAKE 1 TABLET (10 MG TOTAL) BY MOUTH AT BEDTIME AS NEEDED FOR MUSCLE SPASMS., Disp: 15 tablet, Rfl: 0 .  EMGALITY 120 MG/ML SOAJ, INJECT 120MG  INTO THE SKIN EVERY 30 DAYS, Disp: 1 pen, Rfl: 11 .  FLUoxetine (PROZAC) 20 MG tablet, TAKE 2 TABLETS BY MOUTH  DAILY, Disp: 180 tablet, Rfl: 1 .  hydrOXYzine (ATARAX/VISTARIL) 10 MG tablet, Take 1 tablet (10 mg total) by mouth 3 (three) times  daily as needed for anxiety., Disp: 30 tablet, Rfl: 0 .  losartan (COZAAR) 100 MG tablet, TAKE 1 TABLET BY MOUTH  DAILY, Disp: 90 tablet, Rfl: 3 .  metoprolol succinate (TOPROL-XL) 25 MG 24 hr tablet, TAKE 1 TABLET BY MOUTH  DAILY, Disp: 90 tablet, Rfl: 1 .  pantoprazole (PROTONIX) 40 MG tablet, Take 40 mg by mouth daily., Disp: , Rfl:  .  potassium chloride SA (K-DUR) 20 MEQ tablet, Every, Su,M,W, F, Disp: 64 tablet, Rfl: 3 .  promethazine (PHENERGAN) 25 MG tablet, Take 1 tablet (25 mg total) by mouth every 6 (six) hours as needed for nausea or vomiting., Disp: 30 tablet, Rfl: 2 .  tolterodine (DETROL LA) 4 MG 24 hr  capsule, TAKE 1 CAPSULE BY MOUTH  DAILY, Disp: 90 capsule, Rfl: 3 .  traZODone (DESYREL) 50 MG tablet, Take 0.5-1 tablets (25-50 mg total) by mouth at bedtime as needed for sleep., Disp: 30 tablet, Rfl: 3 .  valACYclovir (VALTREX) 1000 MG tablet, Take 1 tablet (1,000 mg total) by mouth 2 (two) times daily., Disp: 20 tablet, Rfl: 0   Observations/Objective: Blood pressure 128/75, pulse 83, temperature 99.9 F (37.7 C), temperature source Temporal, height 5\' 3"  (1.6 m), weight 163 lb 8 oz (74.2 kg).  Physical Exam Constitutional:      General: She is not in acute distress.    Appearance: Normal appearance. She is well-developed. She is not ill-appearing or toxic-appearing.  HENT:     Head: Normocephalic.     Right Ear: Hearing, tympanic membrane, ear canal and external ear normal. Tympanic membrane is not erythematous, retracted or bulging.     Left Ear: Hearing, tympanic membrane, ear canal and external ear normal. Tympanic membrane is not erythematous, retracted or bulging.     Nose: No mucosal edema or rhinorrhea.     Right Sinus: No maxillary sinus tenderness or frontal sinus tenderness.     Left Sinus: No maxillary sinus tenderness or frontal sinus tenderness.     Mouth/Throat:     Pharynx: Uvula midline.  Eyes:     General: Lids are normal. Lids are everted, no foreign bodies appreciated.     Conjunctiva/sclera: Conjunctivae normal.     Pupils: Pupils are equal, round, and reactive to light.  Neck:     Thyroid: No thyroid mass or thyromegaly.     Vascular: No carotid bruit.     Trachea: Trachea normal.  Cardiovascular:     Rate and Rhythm: Normal rate and regular rhythm.     Pulses: Normal pulses.     Heart sounds: Normal heart sounds, S1 normal and S2 normal. No murmur. No friction rub. No gallop.   Pulmonary:     Effort: Pulmonary effort is normal. No tachypnea or respiratory distress.     Breath sounds: Normal breath sounds. No decreased breath sounds, wheezing, rhonchi or  rales.  Abdominal:     General: Bowel sounds are normal.     Palpations: Abdomen is soft.     Tenderness: There is no abdominal tenderness.  Musculoskeletal:     Cervical back: Normal range of motion and neck supple.  Lymphadenopathy:     Head:     Right side of head: No preauricular or posterior auricular adenopathy.     Left side of head: Preauricular and posterior auricular adenopathy present.     Cervical: No cervical adenopathy.     Upper Body:     Right upper body: No supraclavicular adenopathy.     Left  upper body: No supraclavicular adenopathy.  Skin:    General: Skin is warm and dry.     Findings: No rash.     Comments: Blistering rash on left forehead.. non tender.. unroofed and sent culture.  small erythematous nodules at eyebrow and left hairline  Neurological:     Mental Status: She is alert.  Psychiatric:        Mood and Affect: Mood is not anxious or depressed.        Speech: Speech normal.        Behavior: Behavior normal. Behavior is cooperative.        Thought Content: Thought content normal.        Judgment: Judgment normal.      Assessment and Plan   Rash of face Contact derm vs bacterial infeciton... less likely shingles given no pain.  Treat with pred taper and antibtioics .  Close follow up in 3 days for improvement.     [name], MD

## 2019-09-05 NOTE — Addendum Note (Signed)
Addended by: Alvina Chou on: 09/05/2019 04:24 PM   Modules accepted: Orders

## 2019-09-06 ENCOUNTER — Ambulatory Visit (INDEPENDENT_AMBULATORY_CARE_PROVIDER_SITE_OTHER): Payer: 59 | Admitting: Psychology

## 2019-09-06 DIAGNOSIS — F4321 Adjustment disorder with depressed mood: Secondary | ICD-10-CM

## 2019-09-06 DIAGNOSIS — F411 Generalized anxiety disorder: Secondary | ICD-10-CM | POA: Diagnosis not present

## 2019-09-08 ENCOUNTER — Encounter: Payer: Self-pay | Admitting: Family Medicine

## 2019-09-08 ENCOUNTER — Ambulatory Visit (INDEPENDENT_AMBULATORY_CARE_PROVIDER_SITE_OTHER): Payer: Managed Care, Other (non HMO) | Admitting: Family Medicine

## 2019-09-08 ENCOUNTER — Other Ambulatory Visit: Payer: Self-pay

## 2019-09-08 VITALS — BP 125/70 | HR 80 | Temp 99.3°F | Ht 63.0 in | Wt 169.5 lb

## 2019-09-08 DIAGNOSIS — B0230 Zoster ocular disease, unspecified: Secondary | ICD-10-CM | POA: Diagnosis not present

## 2019-09-08 LAB — WOUND CULTURE: Organism ID, Bacteria: NONE SEEN

## 2019-09-08 MED ORDER — VALACYCLOVIR HCL 1 G PO TABS: 1000.0000 mg | ORAL_TABLET | Freq: Three times a day (TID) | ORAL | 0 refills | Status: DC

## 2019-09-08 NOTE — Patient Instructions (Addendum)
Stop prednisone. Do not start keflex.  Start valacyclovir.  Use ibuprofen 800 mg every eight hours for pain control.   Have eye appt ASAP.   Shingles  Shingles, which is also known as herpes zoster, is an infection that causes a painful skin rash and fluid-filled blisters. It is caused by a virus. Shingles only develops in people who:  Have had chickenpox.  Have been given a medicine to protect against chickenpox (have been vaccinated). Shingles is rare in this group. What are the causes? Shingles is caused by varicella-zoster virus (VZV). This is the same virus that causes chickenpox. After a person is exposed to VZV, the virus stays in the body in an inactive (dormant) state. Shingles develops if the virus is reactivated. This can happen many years after the first (initial) exposure to VZV. It is not known what causes this virus to be reactivated. What increases the risk? People who have had chickenpox or received the chickenpox vaccine are at risk for shingles. Shingles infection is more common in people who:  Are older than age 73.  Have a weakened disease-fighting system (immune system), such as people with: ? HIV. ? AIDS. ? Cancer.  Are taking medicines that weaken the immune system, such as transplant medicines.  Are experiencing a lot of stress. What are the signs or symptoms? Early symptoms of this condition include itching, tingling, and pain in an area on your skin. Pain may be described as burning, stabbing, or throbbing. A few days or weeks after early symptoms start, a painful red rash appears. The rash is usually on one side of the body and has a band-like or belt-like pattern. The rash eventually turns into fluid-filled blisters that break open, change into scabs, and dry up in about 2-3 weeks. At any time during the infection, you may also develop:  A fever.  Chills.  A headache.  An upset stomach. How is this diagnosed? This condition is diagnosed with a  skin exam. Skin or fluid samples may be taken from the blisters before a diagnosis is made. These samples are examined under a microscope or sent to a lab for testing. How is this treated? The rash may last for several weeks. There is not a specific cure for this condition. Your health care provider will probably prescribe medicines to help you manage pain, recover more quickly, and avoid long-term problems. Medicines may include:  Antiviral drugs.  Anti-inflammatory drugs.  Pain medicines.  Anti-itching medicines (antihistamines). If the area involved is on your face, you may be referred to a specialist, such as an eye doctor (ophthalmologist) or an ear, nose, and throat (ENT) doctor (otolaryngologist) to help you avoid eye problems, chronic pain, or disability. Follow these instructions at home: Medicines  Take over-the-counter and prescription medicines only as told by your health care provider.  Apply an anti-itch cream or numbing cream to the affected area as told by your health care provider. Relieving itching and discomfort   Apply cold, wet cloths (cold compresses) to the area of the rash or blisters as told by your health care provider.  Cool baths can be soothing. Try adding baking soda or dry oatmeal to the water to reduce itching. Do not bathe in hot water. Blister and rash care  Keep your rash covered with a loose bandage (dressing). Wear loose-fitting clothing to help ease the pain of material rubbing against the rash.  Keep your rash and blisters clean by washing the area with mild soap and cool  water as told by your health care provider.  Check your rash every day for signs of infection. Check for: ? More redness, swelling, or pain. ? Fluid or blood. ? Warmth. ? Pus or a bad smell.  Do not scratch your rash or pick at your blisters. To help avoid scratching: ? Keep your fingernails clean and cut short. ? Wear gloves or mittens while you sleep, if scratching is a  problem. General instructions  Rest as told by your health care provider.  Keep all follow-up visits as told by your health care provider. This is important.  Wash your hands often with soap and water. If soap and water are not available, use hand sanitizer. Doing this lowers your chance of getting a bacterial skin infection.  Before your blisters change into scabs, your shingles infection can cause chickenpox in people who have never had it or have never been vaccinated against it. To prevent this from happening, avoid contact with other people, especially: ? Babies. ? Pregnant women. ? Children who have eczema. ? Elderly people who have transplants. ? People who have chronic illnesses, such as cancer or AIDS. Contact a health care provider if:  Your pain is not relieved with prescribed medicines.  Your pain does not get better after the rash heals.  You have signs of infection in the rash area, such as: ? More redness, swelling, or pain around the rash. ? Fluid or blood coming from the rash. ? The rash area feeling warm to the touch. ? Pus or a bad smell coming from the rash. Get help right away if:  The rash is on your face or nose.  You have facial pain, pain around your eye area, or loss of feeling on one side of your face.  You have difficulty seeing.  You have ear pain or have ringing in your ear.  You have a loss of taste.  Your condition gets worse. Summary  Shingles, which is also known as herpes zoster, is an infection that causes a painful skin rash and fluid-filled blisters.  This condition is diagnosed with a skin exam. Skin or fluid samples may be taken from the blisters and examined before the diagnosis is made.  Keep your rash covered with a loose bandage (dressing). Wear loose-fitting clothing to help ease the pain of material rubbing against the rash.  Before your blisters change into scabs, your shingles infection can cause chickenpox in people who  have never had it or have never been vaccinated against it. This information is not intended to replace advice given to you by your health care provider. Make sure you discuss any questions you have with your health care provider. Document Revised: 08/19/2018 Document Reviewed: 12/30/2016 Elsevier Patient Education  2020 [employer].

## 2019-09-08 NOTE — Assessment & Plan Note (Signed)
Now despite minimal pain.. rash very consistent with zoster.. concern for ocular involvement given let eye pain.  Refer to opthalmology ASAP.Marland Kitchen pt to contact [facility] today.  Stop antibacterial ( wound culture negative). Stop prednisone.  Start valacyclovir as still in first 72 hours of rash appearance. PAin control with ibuprofen. Reviewed time course and contagiousness .

## 2019-09-08 NOTE — Progress Notes (Signed)
Chief Complaint  Patient presents with  . Follow-up    Rash/Cellulits on forehead    History of Present Illness: HPI   34 year old female presents for follow up facial rash presumed cellulitis.   She was started  On 09/05/2019 on keflex TID as well as prednisone taper for possible contact derm vs cellulitis, staph.  She never started the keflex!!!   Today she reports  progression of rash.. more blistering rash on forehead, eyelid and left pinna.  Occ pain in left eyre at times.  Rash tingles mainly and itches. Has a headache... improves with ibuprofen.  Left ear pain.    Very obviously does not pass midline of face.  Minimally painful. Applying neosporin.  Wound culture: negative.    No fever. No cough.   Has had strep in past    This visit occurred during the SARS-CoV-2 public health emergency.  Safety protocols were in place, including screening questions prior to the visit, additional usage of staff PPE, and extensive cleaning of exam room while observing appropriate contact time as indicated for disinfecting solutions.   COVID 19 screen:  No recent travel or known exposure to COVID19 The patient denies respiratory symptoms of COVID 19 at this time. The importance of social distancing was discussed today.     Review of Systems  Constitutional: Negative for chills and fever.  HENT: Negative for congestion and ear pain.   Eyes: Negative for pain and redness.  Respiratory: Negative for cough and shortness of breath.   Cardiovascular: Negative for chest pain, palpitations and leg swelling.  Gastrointestinal: Negative for abdominal pain, blood in stool, constipation, diarrhea, nausea and vomiting.  Genitourinary: Negative for dysuria.  Musculoskeletal: Negative for falls and myalgias.  Skin: Negative for rash.  Neurological: Negative for dizziness.  Psychiatric/Behavioral: Negative for depression. The patient is not nervous/anxious.       Past Medical History:    Diagnosis Date  . Abnormal Pap smear AGE 72   COLPO LAST PAP 08/2011  . Allergy   . Anemia   . Complication of anesthesia    NAUSEA; VOMITING; ITCHING; DIFFICULTY BREATHING  . Genital herpes   . GERD (gastroesophageal reflux disease) AGE 5   tums prn  . History of chicken pox   . Hypertension   . Infection AGE 58   CHLAMYDIA; GONORRHEA  . 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 Infection    BV  . Migraines   . Ovarian cyst rupture   . PONV (postoperative nausea and vomiting)   . Preterm labor   . Urinary incontinence     reports that she has never smoked. She has never used smokeless tobacco. She reports current alcohol use of about 3.0 standard drinks of alcohol per week. She reports that she does not use drugs.   Current Outpatient Medications:  .  amLODipine (NORVASC) 10 MG tablet, TAKE 1 TABLET BY MOUTH  DAILY, Disp: 90 tablet, Rfl: 3 .  cyclobenzaprine (FLEXERIL) 10 MG tablet, TAKE 1 TABLET (10 MG TOTAL) BY MOUTH AT BEDTIME AS NEEDED FOR MUSCLE SPASMS., Disp: 15 tablet, Rfl: 0 .  EMGALITY 120 MG/ML SOAJ, INJECT 120MG  INTO THE SKIN EVERY 30 DAYS, Disp: 1 pen, Rfl: 11 .  FLUoxetine (PROZAC) 20 MG tablet, TAKE 2 TABLETS BY MOUTH  DAILY, Disp: 180 tablet, Rfl: 1 .  hydrOXYzine (ATARAX/VISTARIL) 10 MG tablet, Take 1 tablet (10 mg total) by mouth 3 (three) times daily as needed for anxiety., Disp: 30 tablet, Rfl: 0 .  losartan (COZAAR) 100 MG tablet, TAKE 1 TABLET BY MOUTH  DAILY, Disp: 90 tablet, Rfl: 3 .  metoprolol succinate (TOPROL-XL) 25 MG 24 hr tablet, TAKE 1 TABLET BY MOUTH  DAILY, Disp: 90 tablet, Rfl: 1 .  pantoprazole (PROTONIX) 40 MG tablet, Take 40 mg by mouth daily., Disp: , Rfl:  .  potassium chloride SA (K-DUR) 20 MEQ tablet, Every, Su,M,W, F, Disp: 64 tablet, Rfl: 3 .  predniSONE (DELTASONE) 10 MG tablet, 3 tabs by mouth daily x 3 days, then 2 tabs by mouth daily x 2 days then 1 tab by mouth daily x 2 days, Disp: 15 tablet, Rfl: 0 .  promethazine (PHENERGAN) 25 MG tablet,  Take 1 tablet (25 mg total) by mouth every 6 (six) hours as needed for nausea or vomiting., Disp: 30 tablet, Rfl: 2 .  tolterodine (DETROL LA) 4 MG 24 hr capsule, TAKE 1 CAPSULE BY MOUTH  DAILY, Disp: 90 capsule, Rfl: 3 .  traZODone (DESYREL) 50 MG tablet, Take 0.5-1 tablets (25-50 mg total) by mouth at bedtime as needed for sleep., Disp: 30 tablet, Rfl: 3 .  valACYclovir (VALTREX) 1000 MG tablet, Take 1 tablet (1,000 mg total) by mouth 2 (two) times daily., Disp: 20 tablet, Rfl: 0   Observations/Objective: Blood pressure 125/70, pulse 80, temperature 99.3 F (37.4 C), temperature source Temporal, height 5\' 3"  (1.6 m), weight 169 lb 8 oz (76.9 kg), SpO2 94 %.  Physical Exam Constitutional:      General: She is not in acute distress.    Appearance: Normal appearance. She is well-developed. She is not ill-appearing or toxic-appearing.  HENT:     Head: Normocephalic.     Right Ear: Hearing, tympanic membrane, ear canal and external ear normal. Tympanic membrane is not erythematous, retracted or bulging.     Left Ear: Hearing, tympanic membrane, ear canal and external ear normal. Tympanic membrane is not erythematous, retracted or bulging.     Nose: No mucosal edema or rhinorrhea.     Right Sinus: No maxillary sinus tenderness or frontal sinus tenderness.     Left Sinus: No maxillary sinus tenderness or frontal sinus tenderness.     Mouth/Throat:     Pharynx: Uvula midline.  Eyes:     General: Lids are normal. Lids are everted, no foreign bodies appreciated.     Conjunctiva/sclera: Conjunctivae normal.     Pupils: Pupils are equal, round, and reactive to light.  Neck:     Thyroid: No thyroid mass or thyromegaly.     Vascular: No carotid bruit.     Trachea: Trachea normal.  Cardiovascular:     Rate and Rhythm: Normal rate and regular rhythm.     Pulses: Normal pulses.     Heart sounds: Normal heart sounds, S1 normal and S2 normal. No murmur. No friction rub. No gallop.   Pulmonary:      Effort: Pulmonary effort is normal. No tachypnea or respiratory distress.     Breath sounds: Normal breath sounds. No decreased breath sounds, wheezing, rhonchi or rales.  Abdominal:     General: Bowel sounds are normal.     Palpations: Abdomen is soft.     Tenderness: There is no abdominal tenderness.  Musculoskeletal:     Cervical back: Normal range of motion and neck supple.  Skin:    General: Skin is warm and dry.     Findings: No rash.          Comments: Now increase in blisters on left forehead,  upper eyelid and on pinna. Clar dermatomal demarcation with nothing crossing midline.  Neurological:     Mental Status: She is alert.  Psychiatric:        Mood and Affect: Mood is not anxious or depressed.        Speech: Speech normal.        Behavior: Behavior normal. Behavior is cooperative.        Thought Content: Thought content normal.        Judgment: Judgment normal.      Assessment and Plan Herpes zoster with ophthalmic complication Now despite minimal pain.. rash very consistent with zoster.. concern for ocular involvement given let eye pain.  Refer to opthalmology ASAP.Marland Kitchen pt to contact [facility] today.  Stop antibacterial ( wound culture negative). Stop prednisone.  Start valacyclovir as still in first 72 hours of rash appearance. PAin control with ibuprofen. Reviewed time course and contagiousness .       Eliezer Lofts, MD

## 2019-09-13 ENCOUNTER — Telehealth: Payer: Self-pay | Admitting: Family Medicine

## 2019-09-13 NOTE — Telephone Encounter (Signed)
Will complete upon my return

## 2019-09-13 NOTE — Telephone Encounter (Signed)
New claim for [employer] paperwork received and placed in provider's basket.

## 2019-09-15 DIAGNOSIS — Z0279 Encounter for issue of other medical certificate: Secondary | ICD-10-CM

## 2019-09-18 ENCOUNTER — Telehealth: Payer: Self-pay | Admitting: Family Medicine

## 2019-09-18 NOTE — Telephone Encounter (Signed)
Patient called  She stated her work is requiring her to have a note from her provider stating she may return to work tomorrow 5/10   Patient would like this sent to her my chart,

## 2019-09-19 ENCOUNTER — Other Ambulatory Visit: Payer: Self-pay | Admitting: Neurology

## 2019-09-19 ENCOUNTER — Telehealth: Payer: Self-pay | Admitting: Family Medicine

## 2019-09-19 MED ORDER — EMGALITY 120 MG/ML ~~LOC~~ SOAJ: SUBCUTANEOUS | 2 refills | Status: DC

## 2019-09-19 NOTE — Telephone Encounter (Signed)
Work note sent to patient's MyChart as instructed by Dr. Bedsole. 

## 2019-09-19 NOTE — Telephone Encounter (Signed)
Pt has called and scheduled her annual f/u, she is also on wait list.  Pt is asking if she can get refills on her EMGALITY 120 MG/ML SOAJ until she is able to be seen.  Please call

## 2019-09-19 NOTE — Addendum Note (Signed)
Addended by: Guy Begin on: 09/19/2019 01:24 PM   Modules accepted: Orders

## 2019-09-19 NOTE — Telephone Encounter (Signed)
Okay to send note to work to let her go back to work today.

## 2019-09-20 MED ORDER — FLUOXETINE HCL 40 MG PO CAPS: 40.0000 mg | ORAL_CAPSULE | Freq: Every day | ORAL | 3 refills | Status: DC

## 2019-09-26 NOTE — Telephone Encounter (Addendum)
Addended [employer] FMLA form with new return to work date faxed to [phone].

## 2019-09-26 NOTE — Telephone Encounter (Signed)
Spoke with [place].  She need the date changed on her FMLA paperwork to return to work 09/19/2019 instead of 09/18/2019.  Forms placed in Dr. Daphine Deutscher in box to change date.

## 2019-09-26 NOTE — Telephone Encounter (Signed)
In Angela [name]'s box in my office

## 2019-09-26 NOTE — Telephone Encounter (Signed)
Pt called in to request a note covering her for 5/10 as well. She states she didn't go back to work until 5/11.

## 2019-09-27 ENCOUNTER — Ambulatory Visit (INDEPENDENT_AMBULATORY_CARE_PROVIDER_SITE_OTHER): Payer: 59 | Admitting: Psychology

## 2019-09-27 DIAGNOSIS — F411 Generalized anxiety disorder: Secondary | ICD-10-CM | POA: Diagnosis not present

## 2019-09-27 DIAGNOSIS — F4321 Adjustment disorder with depressed mood: Secondary | ICD-10-CM | POA: Diagnosis not present

## 2019-10-18 ENCOUNTER — Ambulatory Visit (INDEPENDENT_AMBULATORY_CARE_PROVIDER_SITE_OTHER): Payer: 59 | Admitting: Psychology

## 2019-10-18 DIAGNOSIS — F411 Generalized anxiety disorder: Secondary | ICD-10-CM

## 2019-10-18 DIAGNOSIS — F4321 Adjustment disorder with depressed mood: Secondary | ICD-10-CM | POA: Diagnosis not present

## 2019-10-23 ENCOUNTER — Other Ambulatory Visit: Payer: Self-pay | Admitting: Family Medicine

## 2019-11-02 ENCOUNTER — Ambulatory Visit: Payer: Managed Care, Other (non HMO) | Admitting: Family Medicine

## 2019-11-03 ENCOUNTER — Ambulatory Visit (INDEPENDENT_AMBULATORY_CARE_PROVIDER_SITE_OTHER): Payer: Managed Care, Other (non HMO) | Admitting: Family Medicine

## 2019-11-03 ENCOUNTER — Encounter: Payer: Self-pay | Admitting: Family Medicine

## 2019-11-03 ENCOUNTER — Telehealth: Payer: Self-pay | Admitting: Family Medicine

## 2019-11-03 ENCOUNTER — Other Ambulatory Visit: Payer: Self-pay

## 2019-11-03 VITALS — BP 130/78 | HR 98 | Temp 98.2°F | Ht 63.0 in | Wt 168.5 lb

## 2019-11-03 DIAGNOSIS — N3090 Cystitis, unspecified without hematuria: Secondary | ICD-10-CM

## 2019-11-03 DIAGNOSIS — Q638 Other specified congenital malformations of kidney: Secondary | ICD-10-CM

## 2019-11-03 DIAGNOSIS — R300 Dysuria: Secondary | ICD-10-CM

## 2019-11-03 LAB — POC URINALSYSI DIPSTICK (AUTOMATED)
Bilirubin, UA: NEGATIVE
Glucose, UA: NEGATIVE
Ketones, UA: NEGATIVE
Nitrite, UA: NEGATIVE
Protein, UA: NEGATIVE
Spec Grav, UA: 1.025 (ref 1.010–1.025)
Urobilinogen, UA: 0.2 E.U./dL
pH, UA: 6 (ref 5.0–8.0)

## 2019-11-03 MED ORDER — SULFAMETHOXAZOLE-TRIMETHOPRIM 800-160 MG PO TABS: 1.0000 | ORAL_TABLET | Freq: Two times a day (BID) | ORAL | 0 refills | Status: AC

## 2019-11-03 NOTE — Progress Notes (Signed)
Elie Gragert T. Rehema Muffley, MD, CAQ Sports Medicine  Primary Care and Sports Medicine [facility] [address] [place], [address]  Phone: [phone]  FAX: [phone]  CHESSICA AUDIA - 34 y.o. female  MRN [id number]  Date of Birth: May 11, 1986  Date: 11/03/2019  PCP: Excell Seltzer, MD  Referral: Excell Seltzer, MD  Chief Complaint  Patient presents with  . Dysuria    This visit occurred during the SARS-CoV-2 public health emergency.  Safety protocols were in place, including screening questions prior to the visit, additional usage of staff PPE, and extensive cleaning of exam room while observing appropriate contact time as indicated for disinfecting solutions.   Subjective:   This 34 y.o. female patient presents with burning, urgency. No vaginal discharge or external irritation.  No STD exposure. No abd pain, no flank pain.  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   GEN: WDWN HEENT: Atraumatc, normocephalic. CV: RRR, No M/G/R PULM: CTA B, No wheezes, crackles, or rhonchi ABD: S, ND, +BS, no rebound. No CVAT. + suprapubic tenderness.  Objective Data: Results for orders placed or performed in visit on 11/03/19  POCT Urinalysis Dipstick (Automated)  Result Value Ref Range   Color, UA Yellow    Clarity, UA Hazy    Glucose, UA Negative Negative   Bilirubin, UA Negative    Ketones, UA Negative    Spec Grav, UA 1.025 1.010 - 1.025   Blood, UA Small    pH, UA 6.0 5.0 - 8.0   Protein, UA Negative Negative   Urobilinogen, UA 0.2 0.2 or 1.0 E.U./dL   Nitrite, UA Negative    Leukocytes, UA Trace (A) Negative    Assessment and Plan:     ICD-10-CM   1. Cystitis  N30.90     2. Dysuria  R30.0 POCT Urinalysis Dipstick (Automated)    Urine Culture  3. 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 Drink plenty of fluids and supportive care.  Follow-up: No follow-ups on file.  Meds ordered this encounter  Medications  . sulfamethoxazole-trimethoprim (BACTRIM DS) 800-160 MG tablet    Sig: Take 1 tablet by mouth 2 (two) times daily for 7 days.    Dispense:  14 tablet    Refill:  0   Orders Placed This Encounter  Procedures  . Urine Culture  . POCT Urinalysis Dipstick (Automated)    Signed,  Aceson Labell T. Gaven Eugene, MD   Patient's Medications  New Prescriptions   SULFAMETHOXAZOLE-TRIMETHOPRIM (BACTRIM DS) 800-160 MG TABLET    Take 1 tablet by mouth 2 (two) times daily for 7 days.  Previous Medications   AMLODIPINE (NORVASC) 10 MG TABLET    TAKE 1 TABLET BY MOUTH  DAILY   CYCLOBENZAPRINE (FLEXERIL) 10 MG TABLET    TAKE 1 TABLET (10 MG TOTAL) BY MOUTH AT BEDTIME AS NEEDED FOR MUSCLE SPASMS.   FLUOXETINE (PROZAC) 40 MG CAPSULE    Take 1 capsule (40 mg total) by mouth daily.   GALCANEZUMAB-GNLM (EMGALITY) 120 MG/ML SOAJ    INJECT 120MG  INTO THE SKIN EVERY 30 DAYS   HYDROXYZINE (ATARAX/VISTARIL) 10 MG  TABLET    Take 1 tablet (10 mg total) by mouth 3 (three) times daily as needed for anxiety.   LOSARTAN (COZAAR) 100 MG TABLET    TAKE 1 TABLET BY MOUTH  DAILY   METOPROLOL SUCCINATE (TOPROL-XL) 25 MG 24 HR TABLET    TAKE 1 TABLET BY MOUTH  DAILY   PANTOPRAZOLE (PROTONIX) 40 MG TABLET    Take 40 mg by mouth daily.   POTASSIUM CHLORIDE SA (K-DUR) 20 MEQ TABLET    Every, Su,M,W, F   PROMETHAZINE (PHENERGAN) 25 MG TABLET    Take 1 tablet (25 mg total) by mouth every 6 (six) hours as needed for nausea or vomiting.   TOLTERODINE (DETROL LA) 4 MG 24 HR CAPSULE    TAKE 1 CAPSULE BY MOUTH  DAILY   TRAZODONE (DESYREL) 50 MG TABLET    Take 0.5-1 tablets (25-50 mg total) by mouth at bedtime as needed for sleep.   VALACYCLOVIR (VALTREX) 1000  MG TABLET    Take 1 tablet (1,000 mg total) by mouth 3 (three) times daily.  Modified Medications   No medications on file  Discontinued Medications   PREDNISONE (DELTASONE) 10 MG TABLET    3 tabs by mouth daily x 3 days, then 2 tabs by mouth daily x 2 days then 1 tab by mouth daily x 2 days

## 2019-11-03 NOTE — Telephone Encounter (Signed)
Work note sent to patient's MyChart as requested. 

## 2019-11-03 NOTE — Telephone Encounter (Signed)
Patient was seen by Dr.Copland today.  Patient would like a work note for today sent through my chart.

## 2019-11-05 LAB — URINE CULTURE
MICRO NUMBER:: 10635639
SPECIMEN QUALITY:: ADEQUATE

## 2019-11-08 ENCOUNTER — Ambulatory Visit (INDEPENDENT_AMBULATORY_CARE_PROVIDER_SITE_OTHER): Payer: 59 | Admitting: Psychology

## 2019-11-08 DIAGNOSIS — F411 Generalized anxiety disorder: Secondary | ICD-10-CM | POA: Diagnosis not present

## 2019-11-08 DIAGNOSIS — F4321 Adjustment disorder with depressed mood: Secondary | ICD-10-CM

## 2019-11-15 ENCOUNTER — Telehealth: Payer: Self-pay | Admitting: Family Medicine

## 2019-11-15 NOTE — Telephone Encounter (Signed)
Forms placed in Dr. Bedsole's in box to complete. 

## 2019-11-15 NOTE — Telephone Encounter (Signed)
Received ppw faxed from [employer]. Placed on cart to be filled out.

## 2019-11-16 ENCOUNTER — Other Ambulatory Visit: Payer: Self-pay | Admitting: Family Medicine

## 2019-11-16 NOTE — Telephone Encounter (Signed)
Confused about what these forms are for.. I started to complete them for possible intermittent leave for flares of panic and anxiety, but is she needing these for recent UTi or shingles flare? Or a recurrence of anxiety? Why is the intermittent leave section crossed out? I had though she was back at work already?   Please call pt to clarify what FMLA is needed for. I will likely need new blank form to complete.  Form ios in my box in my office.

## 2019-11-17 NOTE — Telephone Encounter (Signed)
Pt forms are for her OAB and for her be approved bathroom breaks every hour x 5 mins.  This is for a continuous type leave not intermittent as she would be utilizing this daily every hour.

## 2019-11-17 NOTE — Telephone Encounter (Signed)
Need new blank form

## 2019-11-17 NOTE — Telephone Encounter (Signed)
Last office visit 11/03/19 acute No upcoming appointment See drug warning with Trazodone LR 09/20/19 #30/3 at [facility]

## 2019-11-21 NOTE — Telephone Encounter (Signed)
Spoke with [employer] they will refax paperwork

## 2019-11-24 NOTE — Telephone Encounter (Signed)
In outbox

## 2019-11-24 NOTE — Telephone Encounter (Signed)
Blank form in dr Ermalene Searing in box

## 2019-11-27 NOTE — Telephone Encounter (Signed)
Paperwork faxed °

## 2019-11-28 NOTE — Telephone Encounter (Signed)
Left message asking pt to call offic Please let pt know paperwork has been faxed and a copy is here for her  Copy for pt Copy for scan

## 2019-11-29 ENCOUNTER — Ambulatory Visit (INDEPENDENT_AMBULATORY_CARE_PROVIDER_SITE_OTHER): Payer: 59 | Admitting: Psychology

## 2019-11-29 DIAGNOSIS — F411 Generalized anxiety disorder: Secondary | ICD-10-CM | POA: Diagnosis not present

## 2019-11-29 DIAGNOSIS — F4321 Adjustment disorder with depressed mood: Secondary | ICD-10-CM | POA: Diagnosis not present

## 2019-12-04 ENCOUNTER — Other Ambulatory Visit: Payer: Self-pay

## 2019-12-04 ENCOUNTER — Encounter: Payer: Self-pay | Admitting: Family Medicine

## 2019-12-04 ENCOUNTER — Ambulatory Visit (INDEPENDENT_AMBULATORY_CARE_PROVIDER_SITE_OTHER): Payer: Managed Care, Other (non HMO) | Admitting: Primary Care

## 2019-12-04 ENCOUNTER — Encounter: Payer: Self-pay | Admitting: Primary Care

## 2019-12-04 ENCOUNTER — Ambulatory Visit: Payer: Managed Care, Other (non HMO) | Admitting: Family Medicine

## 2019-12-04 VITALS — BP 137/87 | HR 110 | Ht 63.0 in | Wt 167.0 lb

## 2019-12-04 VITALS — BP 134/84 | HR 100 | Temp 96.4°F | Ht 63.0 in | Wt 163.5 lb

## 2019-12-04 DIAGNOSIS — R102 Pelvic and perineal pain: Secondary | ICD-10-CM | POA: Diagnosis not present

## 2019-12-04 DIAGNOSIS — G43709 Chronic migraine without aura, not intractable, without status migrainosus: Secondary | ICD-10-CM | POA: Diagnosis not present

## 2019-12-04 LAB — POC URINALSYSI DIPSTICK (AUTOMATED)
Bilirubin, UA: NEGATIVE
Blood, UA: NEGATIVE
Glucose, UA: NEGATIVE
Ketones, UA: NEGATIVE
Leukocytes, UA: NEGATIVE
Nitrite, UA: NEGATIVE
Protein, UA: NEGATIVE
Spec Grav, UA: <=1.005 — AB (ref 1.010–1.025)
Urobilinogen, UA: 0.2 E.U./dL
pH, UA: 6.5 (ref 5.0–8.0)

## 2019-12-04 NOTE — Assessment & Plan Note (Addendum)
To suprapubic region. No other symptoms. UA today negative. Culture sent given recent history of acute cystitis.  Discussed other causes including interstitial cystitis, cystocele. She has no vaginal symptoms.   Await culture results. Continue water intake.

## 2019-12-04 NOTE — Progress Notes (Addendum)
PATIENT: Angela [name] DOB: Feb 06, 1986  REASON FOR VISIT: follow up HISTORY FROM: patient  Chief Complaint  Patient presents with  . Follow-up    63yr f/u for migraines   . room5    alone     HISTORY OF PRESENT ILLNESS: Today 12/04/19 Angela [name] is a 34 y.o. female here today for follow up for migraines. She continues Emgality monthly. She feels that she is doing well. She does note increase in headache frequency the week prior to next injection. She is able to use complementary therapies to abort headache. She is unable to tolerate triptan medications. She rarely uses phenergan.   HISTORY: (copied from my note on 09/27/2018)  Angela [name] is a 34 y.o. female for follow up of migraines. She is doing well on Emgality. She reports that she has about 2-3 migraines per month. She feels that migraines return the week prior to [facility] dose. She does not use abortive therapy. She prefers compression and relaxation. She does have sound and light sensitivity with migraines and also has nausea and vomiting when they are severe.    History (copied from Dr Trevor Mace note on 09/24/2017)  Her headaches are much improved. 2-3 migraines a month baseline was >15. She is thrilled with Ajovy. No side effects. Will continue. Discussed other options and the other cgrp meds, she is doing so well wont change a thing. Discussed acute management as well.  Interval history: Discussion today with patient regarding Botox versus medications versus the new CGRP medications. Patient would like to try the new CGRP medications. Provided sample today of Ajovy.  Angela S Williamsonis a 34 y.o.femalehere as a referral from Dr. Shelly Coss migraines.Past medical history of migraines, hypertension, anemia.Sister with migraines. Headaches worsening and started becoming very intense with nausea, vomiting, dizziness, blurry vision. Started in April with 2 weeks of hedaches. No  inciting events or head trauma. There was stress at work however. Starts in the temples and behind the eys, can be unilateral and also in the back, pounding she can feel her veins and eyes about to pop out. Light and sound bothers her. She has nausea and vomiting. She has ear pain with the headaches but ear exam normal. She sees floaters sometime but no aura. She has 25 headache days a month, 15 are migrainous and can last up to 24 hours. She sometimes has morning headaches. No other focal neurologic deficits, associated symptoms, inciting events or modifiable factors.She was prescribed topiramate but never picked it upbc she read the side effects. She hs her fallopian tubes removed, no more children.No other focal neurologic deficits, associated symptoms, inciting events or modifiable factors.  Meds tried: Imitrex (made her feel weird), flexeril,Labetalol and Topiramate   Reviewed notes, labs and imaging from outside physicians, which showed:   hgba1c 5.6, CMP nml 09/2016  Reviewed notes, patient has been to primary care multiple times and a migraines. Treated with Zofran. She reported using ibuprofen and Imitrex. Acute headaches resolved with Imitrex. She reported headaches every other day in April of this year. Feels a band around her head that is squeezing, associated with nausea, blurred vision and dizziness, photo and phonophobia associated, also noted ear pressure. Triggers include increase in stress. Exam noted that there was no papilledema. She was started on Topamax at bedtime. She did not fill this given fear of side effects. She is using Imitrex for severe headaches and using ibuprofen as well as. The month with headaches usually 1-2 hours but  some up to 6-12 hours.   REVIEW OF SYSTEMS: Out of a complete 14 system review of symptoms, the patient complains only of the following symptoms, hedaaches and all other reviewed systems are negative.  ALLERGIES: Allergies  Allergen Reactions    . Amoxicillin Hives    Has patient had a PCN reaction causing immediate rash, facial/tongue/throat swelling, SOB or lightheadedness with hypotension: No Has patient had a PCN reaction causing severe rash involving mucus membranes or skin necrosis: Yes Has patient had a PCN reaction that required hospitalization No Has patient had a PCN reaction occurring within the last 10 years: No If all of the above answers are "NO", then may proceed with Cephalosporin use.   Marland Kitchen. Dexilant [Dexlansoprazole] Nausea Only and Other (See Comments)    dizziness    HOME MEDICATIONS: Outpatient Medications Prior to Visit  Medication Sig Dispense Refill  . amLODipine (NORVASC) 10 MG tablet TAKE 1 TABLET BY MOUTH  DAILY 90 tablet 3  . cyclobenzaprine (FLEXERIL) 10 MG tablet TAKE 1 TABLET (10 MG TOTAL) BY MOUTH AT BEDTIME AS NEEDED FOR MUSCLE SPASMS. 15 tablet 0  . FLUoxetine (PROZAC) 40 MG capsule Take 1 capsule (40 mg total) by mouth daily. 30 capsule 3  . Galcanezumab-gnlm (EMGALITY) 120 MG/ML SOAJ INJECT 120MG  INTO THE SKIN EVERY 30 DAYS 1 pen 2  . hydrOXYzine (ATARAX/VISTARIL) 10 MG tablet Take 1 tablet (10 mg total) by mouth 3 (three) times daily as needed for anxiety. 30 tablet 0  . losartan (COZAAR) 100 MG tablet TAKE 1 TABLET BY MOUTH  DAILY 90 tablet 3  . metoprolol succinate (TOPROL-XL) 25 MG 24 hr tablet TAKE 1 TABLET BY MOUTH  DAILY 90 tablet 1  . pantoprazole (PROTONIX) 40 MG tablet Take 40 mg by mouth daily.    . potassium chloride SA (K-DUR) 20 MEQ tablet Every, Su,M,W, F 64 tablet 3  . promethazine (PHENERGAN) 25 MG tablet Take 1 tablet (25 mg total) by mouth every 6 (six) hours as needed for nausea or vomiting. 30 tablet 2  . tolterodine (DETROL LA) 4 MG 24 hr capsule TAKE 1 CAPSULE BY MOUTH  DAILY 90 capsule 3  . traZODone (DESYREL) 50 MG tablet Take 0.5-1 tablets (25-50 mg total) by mouth at bedtime as needed for sleep. 30 tablet 3  . valACYclovir (VALTREX) 1000 MG tablet Take 1 tablet (1,000 mg  total) by mouth 3 (three) times daily. 21 tablet 0  . FLUoxetine (PROZAC) 20 MG tablet TAKE 2 TABLETS BY MOUTH  DAILY 180 tablet 0   No facility-administered medications prior to visit.    PAST MEDICAL HISTORY: Past Medical History:  Diagnosis Date  . Abnormal Pap smear AGE 31   COLPO LAST PAP 08/2011  . Allergy   . Anemia   . Complication of anesthesia    NAUSEA; VOMITING; ITCHING; DIFFICULTY BREATHING  . Genital herpes   . GERD (gastroesophageal reflux disease) AGE 89   tums prn  . History of chicken pox   . Hypertension   . Infection AGE 52   CHLAMYDIA; GONORRHEA  . 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 Infection    BV  . Migraines   . Ovarian cyst rupture   . PONV (postoperative nausea and vomiting)   . Preterm labor   . Urinary incontinence     PAST SURGICAL HISTORY: Past Surgical History:  Procedure Laterality Date  . CERVIX LESION DESTRUCTION    . CESAREAN SECTION  2005, 2010   x2  .  CESAREAN SECTION WITH BILATERAL TUBAL LIGATION Bilateral 11/15/2012   Procedure: REPEAT CESAREAN SECTION WITH BILATERAL TUBAL LIGATION;  Surgeon: Kirkland Hun, MD;  Location: [facility];  Service: Obstetrics;  Laterality: Bilateral;  . COLPOSCOPY    . UNILATERAL SALPINGECTOMY Right 11/15/2012   Procedure: UNILATERAL SALPINGECTOMY;  Surgeon: Kirkland Hun, MD;  Location: [facility];  Service: Obstetrics;  Laterality: Right;  . WISDOM TOOTH EXTRACTION      FAMILY HISTORY: Family History  Problem Relation Age of Onset  . Anesthesia problems Mother        NAUSEA AND VOMITING  . Thyroid disease Mother   . Fibromyalgia Mother   . Sarcoidosis Mother   . Depression Mother   . Other Mother        VARICOSE VEINS  . Anesthesia problems Other   . Diabetes Father   . Hypertension Sister   . Arthritis Maternal Grandmother   . Heart disease Maternal Grandmother   . Hypertension Maternal Grandmother   . Kidney disease Maternal Grandmother        FAILURE  . Prostate cancer Maternal Grandfather   .  Depression Maternal Aunt   . Sarcoidosis Maternal Aunt   . Asthma Cousin   . Liver disease Cousin        requiring liver transplant- unknown as to what type of liver disease  . Colon cancer Neg Hx   . Esophageal cancer Neg Hx   . Pancreatic cancer Neg Hx   . Stomach cancer Neg Hx   . Inflammatory bowel disease Neg Hx   . Rectal cancer Neg Hx     SOCIAL HISTORY: Social History   Socioeconomic History  . Marital status: Married    Spouse name: HASAN  . Number of children: 2  . Years of education: 14+  . Highest education level: Not on file  Occupational History  . Occupation: Retail banker: [employer]  Tobacco Use  . Smoking status: Never Smoker  . Smokeless tobacco: Never Used  Vaping Use  . Vaping Use: Never used  Substance and Sexual Activity  . Alcohol use: Yes    Alcohol/week: 3.0 standard drinks    Types: 2 Glasses of wine, 1 Shots of liquor per week    Comment: weekends  . Drug use: No  . Sexual activity: Yes    Partners: Male    Birth control/protection: Other-see comments    Comment: Ablation  Other Topics Concern  . Not on file  Social History Narrative   Lives at home w/ her husband and children   Right-handed   Caffeine: 1 cup coffee daily   Social Determinants of Health   Financial Resource Strain:   . Difficulty of Paying Living Expenses:   Food Insecurity:   . Worried About Programme researcher, broadcasting/film/video in the Last Year:   . Barista in the Last Year:   Transportation Needs:   . Freight forwarder (Medical):   Marland Kitchen Lack of Transportation (Non-Medical):   Physical Activity:   . Days of Exercise per Week:   . Minutes of Exercise per Session:   Stress:   . Feeling of Stress :   Social Connections:   . Frequency of Communication with Friends and Family:   . Frequency of Social Gatherings with Friends and Family:   . Attends Religious Services:   . Active Member of Clubs or Organizations:   . Attends Banker  Meetings:   Marland Kitchen Marital Status:  Intimate Partner Violence:   . Fear of Current or Ex-Partner:   . Emotionally Abused:   Marland Kitchen Physically Abused:   . Sexually Abused:       PHYSICAL EXAM  Vitals:   12/04/19 0725  BP: (!) 137/87  Pulse: (!) 110  Weight: 167 lb (75.8 kg)  Height: 5\' 3"  (1.6 m)   Body mass index is 29.58 kg/m.  Generalized: Well developed, in no acute distress  Cardiology: normal rate and rhythm, no murmur noted Respiratory: clear to auscultation bilaterally  Neurological examination  Mentation: Alert oriented to time, place, history taking. Follows all commands speech and language fluent Cranial nerve II-XII: Pupils were equal round reactive to light. Extraocular movements were full, visual field were full  Motor: The motor testing reveals 5 over 5 strength of all 4 extremities. Good symmetric motor tone is noted throughout.   Gait and station: Gait is normal.   DIAGNOSTIC DATA (LABS, IMAGING, TESTING) - I reviewed patient records, labs, notes, testing and imaging myself where available.  No flowsheet data found.   Lab Results  Component Value Date   WBC 6.5 10/28/2018   HGB 12.1 10/28/2018   HCT 36.8 10/28/2018   MCV 93 10/28/2018   PLT 356 10/28/2018      Component Value Date/Time   NA 137 04/13/2019 1137   K 4.3 04/13/2019 1137   CL 97 04/13/2019 1137   CO2 22 04/13/2019 1137   GLUCOSE 123 (H) 04/13/2019 1137   GLUCOSE 165 (H) 10/12/2018 1912   BUN 9 04/13/2019 1137   CREATININE 0.73 04/13/2019 1137   CALCIUM 10.3 (H) 04/13/2019 1137   PROT 7.5 04/13/2019 1137   ALBUMIN 4.9 (H) 04/13/2019 1137   AST 25 04/13/2019 1137   ALT 24 04/13/2019 1137   ALKPHOS 55 04/13/2019 1137   BILITOT 0.5 04/13/2019 1137   GFRNONAA 108 04/13/2019 1137   GFRAA 124 04/13/2019 1137   Lab Results  Component Value Date   CHOL 134 01/17/2019   HDL 74 01/17/2019   LDLCALC 39 01/17/2019   TRIG 119 01/17/2019   CHOLHDL 1.8 01/17/2019   Lab Results  Component  Value Date   HGBA1C 5.3 01/17/2019   No results found for: VITAMINB12 Lab Results  Component Value Date   TSH 3.182 10/12/2018       ASSESSMENT AND PLAN 34 y.o. year old female  has a past medical history of Abnormal Pap smear (AGE 49), Allergy, Anemia, Complication of anesthesia, Genital herpes, GERD (gastroesophageal reflux disease) (AGE 16), History of chicken pox, Hypertension, Infection (AGE 49), Infection (2007), Infection, Migraines, Ovarian cyst rupture, PONV (postoperative nausea and vomiting), Preterm labor, and Urinary incontinence. here with     ICD-10-CM   1. Chronic migraine without aura without status migrainosus, not intractable  G43.709     Angela [name] is doing well on Emgality every 30 days. Migraines are well managed and easily aborted with complementary therapies. She will continue current treatment plan. We will consider adding Nurtec if needed in the future. She was encouraged to continue healthy lifestyle habits. Follow up in 1 year, sooner if needed. She verbalizes understanding and agreement with this plan.    No orders of the defined types were placed in this encounter.    No orders of the defined types were placed in this encounter.     I spent 15 minutes with the patient. 50% of this time was spent counseling and educating patient on plan of care and medications.    Angela [name]  Nicholas Lose, FNP-C 12/04/2019, 7:48 AM [facility] [address] [place], [place] [address] [phone]  Made any corrections needed, and agree with history, physical, neuro exam,assessment and plan as stated.     Naomie Dean, MD [facility]

## 2019-12-04 NOTE — Progress Notes (Signed)
Subjective:    Patient ID: Angela [name], female    DOB: 1986-04-22, 34 y.o.   MRN: [id number]  HPI  This visit occurred during the SARS-CoV-2 public health emergency.  Safety protocols were in place, including screening questions prior to the visit, additional usage of staff PPE, and extensive cleaning of exam room while observing appropriate contact time as indicated for disinfecting solutions.   Angela [name] is a 34 year old female patient of Dr. Ermalene Searing with a medical history of hypertension, urge incontinence, prediabetes who presents today with a chief complaint of suprapubic pressure.  Symptoms began three days ago. She denies vaginal itching, vaginal discharge, hematuria, nausea, increased frequency. She was treated for acute cystitis in late June 2021, completed a seven day course of Bactrim DS tablets with resolve in symptoms.   BP Readings from Last 3 Encounters:  12/04/19 (!) 134/84  12/04/19 (!) 137/87  11/03/19 130/78     Review of Systems  Constitutional: Negative for fever.  Gastrointestinal: Negative for abdominal pain and nausea.  Genitourinary: Positive for pelvic pain. Negative for dysuria, frequency, hematuria, urgency and vaginal discharge.       Past Medical History:  Diagnosis Date  . Abnormal Pap smear AGE 9   COLPO LAST PAP 08/2011  . Allergy   . Anemia   . Complication of anesthesia    NAUSEA; VOMITING; ITCHING; DIFFICULTY BREATHING  . Genital herpes   . GERD (gastroesophageal reflux disease) AGE 13   tums prn  . History of chicken pox   . Hypertension   . Infection AGE 39   CHLAMYDIA; GONORRHEA  . 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 Infection    BV  . Migraines   . Ovarian cyst rupture   . PONV (postoperative nausea and vomiting)   . Preterm labor   . Urinary incontinence      Social History   Socioeconomic History  . Marital status: Married    Spouse name: HASAN  . Number of children: 2  . Years of education: 14+  . Highest  education level: Not on file  Occupational History  . Occupation: Retail banker: [employer]  Tobacco Use  . Smoking status: Never Smoker  . Smokeless tobacco: Never Used  Vaping Use  . Vaping Use: Never used  Substance and Sexual Activity  . Alcohol use: Yes    Alcohol/week: 3.0 standard drinks    Types: 2 Glasses of wine, 1 Shots of liquor per week    Comment: weekends  . Drug use: No  . Sexual activity: Yes    Partners: Male    Birth control/protection: Other-see comments    Comment: Ablation  Other Topics Concern  . Not on file  Social History Narrative   Lives at home w/ her husband and children   Right-handed   Caffeine: 1 cup coffee daily   Social Determinants of Health   Financial Resource Strain:   . Difficulty of Paying Living Expenses:   Food Insecurity:   . Worried About Programme researcher, broadcasting/film/video in the Last Year:   . Barista in the Last Year:   Transportation Needs:   . Freight forwarder (Medical):   Marland Kitchen Lack of Transportation (Non-Medical):   Physical Activity:   . Days of Exercise per Week:   . Minutes of Exercise per Session:   Stress:   . Feeling of Stress :   Social Connections:   . Frequency of  Communication with Friends and Family:   . Frequency of Social Gatherings with Friends and Family:   . Attends Religious Services:   . Active Member of Clubs or Organizations:   . Attends Banker Meetings:   Marland Kitchen Marital Status:   Intimate Partner Violence:   . Fear of Current or Ex-Partner:   . Emotionally Abused:   Marland Kitchen Physically Abused:   . Sexually Abused:     Past Surgical History:  Procedure Laterality Date  . CERVIX LESION DESTRUCTION    . CESAREAN SECTION  2005, 2010   x2  . CESAREAN SECTION WITH BILATERAL TUBAL LIGATION Bilateral 11/15/2012   Procedure: REPEAT CESAREAN SECTION WITH BILATERAL TUBAL LIGATION;  Surgeon: Kirkland Hun, MD;  Location: [facility];  Service: Obstetrics;  Laterality: Bilateral;  .  COLPOSCOPY    . UNILATERAL SALPINGECTOMY Right 11/15/2012   Procedure: UNILATERAL SALPINGECTOMY;  Surgeon: Kirkland Hun, MD;  Location: [facility];  Service: Obstetrics;  Laterality: Right;  . WISDOM TOOTH EXTRACTION      Family History  Problem Relation Age of Onset  . Anesthesia problems Mother        NAUSEA AND VOMITING  . Thyroid disease Mother   . Fibromyalgia Mother   . Sarcoidosis Mother   . Depression Mother   . Other Mother        VARICOSE VEINS  . Anesthesia problems Other   . Diabetes Father   . Hypertension Sister   . Arthritis Maternal Grandmother   . Heart disease Maternal Grandmother   . Hypertension Maternal Grandmother   . Kidney disease Maternal Grandmother        FAILURE  . Prostate cancer Maternal Grandfather   . Depression Maternal Aunt   . Sarcoidosis Maternal Aunt   . Asthma Cousin   . Liver disease Cousin        requiring liver transplant- unknown as to what type of liver disease  . Colon cancer Neg Hx   . Esophageal cancer Neg Hx   . Pancreatic cancer Neg Hx   . Stomach cancer Neg Hx   . Inflammatory bowel disease Neg Hx   . Rectal cancer Neg Hx     Allergies  Allergen Reactions  . Amoxicillin Hives    Has patient had a PCN reaction causing immediate rash, facial/tongue/throat swelling, SOB or lightheadedness with hypotension: No Has patient had a PCN reaction causing severe rash involving mucus membranes or skin necrosis: Yes Has patient had a PCN reaction that required hospitalization No Has patient had a PCN reaction occurring within the last 10 years: No If all of the above answers are "NO", then may proceed with Cephalosporin use.   Marland Kitchen Dexilant [Dexlansoprazole] Nausea Only and Other (See Comments)    dizziness    Current Outpatient Medications on File Prior to Visit  Medication Sig Dispense Refill  . amLODipine (NORVASC) 10 MG tablet TAKE 1 TABLET BY MOUTH  DAILY 90 tablet 3  . cyclobenzaprine (FLEXERIL) 10 MG tablet TAKE 1 TABLET (10  MG TOTAL) BY MOUTH AT BEDTIME AS NEEDED FOR MUSCLE SPASMS. 15 tablet 0  . FLUoxetine (PROZAC) 40 MG capsule Take 1 capsule (40 mg total) by mouth daily. 30 capsule 3  . Galcanezumab-gnlm (EMGALITY) 120 MG/ML SOAJ INJECT 120MG  INTO THE SKIN EVERY 30 DAYS 1 pen 2  . hydrOXYzine (ATARAX/VISTARIL) 10 MG tablet Take 1 tablet (10 mg total) by mouth 3 (three) times daily as needed for anxiety. 30 tablet 0  . losartan (COZAAR) 100 MG  tablet TAKE 1 TABLET BY MOUTH  DAILY 90 tablet 3  . metoprolol succinate (TOPROL-XL) 25 MG 24 hr tablet TAKE 1 TABLET BY MOUTH  DAILY 90 tablet 1  . pantoprazole (PROTONIX) 40 MG tablet Take 40 mg by mouth daily.    . potassium chloride SA (K-DUR) 20 MEQ tablet Every, Su,M,W, F 64 tablet 3  . promethazine (PHENERGAN) 25 MG tablet Take 1 tablet (25 mg total) by mouth every 6 (six) hours as needed for nausea or vomiting. 30 tablet 2  . tolterodine (DETROL LA) 4 MG 24 hr capsule TAKE 1 CAPSULE BY MOUTH  DAILY 90 capsule 3  . traZODone (DESYREL) 50 MG tablet Take 0.5-1 tablets (25-50 mg total) by mouth at bedtime as needed for sleep. 30 tablet 3  . valACYclovir (VALTREX) 1000 MG tablet Take 1 tablet (1,000 mg total) by mouth 3 (three) times daily. 21 tablet 0   No current facility-administered medications on file prior to visit.    BP (!) 134/84   Pulse 100   Temp (!) 96.4 F (35.8 C) (Temporal)   Ht 5\' 3"  (1.6 m)   Wt 163 lb 8 oz (74.2 kg)   SpO2 100%   BMI 28.96 kg/m    Objective:   Physical Exam Pulmonary:     Effort: Pulmonary effort is normal.  Abdominal:     General: Bowel sounds are normal.     Palpations: Abdomen is soft.     Tenderness: There is no abdominal tenderness. There is no right CVA tenderness or left CVA tenderness.  Neurological:     Mental Status: She is alert.            Assessment & Plan:

## 2019-12-04 NOTE — Patient Instructions (Addendum)
We continue Emgality monthly. Continue complementary therapy as needed for migraine abortion. May consider Nurtec if needed in the future.   Follow up in 1 year    Rimegepant oral dissolving tablet What is this medicine? RIMEGEPANT (ri ME je pant) is used to treat migraine headaches with or without aura. An aura is a strange feeling or visual disturbance that warns you of an attack. It is not used to prevent migraines. This medicine may be used for other purposes; ask your health care provider or pharmacist if you have questions. COMMON BRAND NAME(S): NURTEC ODT What should I tell my health care provider before I take this medicine? They need to know if you have any of these conditions:  kidney disease  liver disease  an unusual or allergic reaction to rimegepant, other medicines, foods, dyes, or preservatives  pregnant or trying to get pregnant  breast-feeding How should I use this medicine? Take the medicine by mouth. Follow the directions on the prescription label. Leave the tablet in the sealed blister pack until you are ready to take it. With dry hands, open the blister and gently remove the tablet. If the tablet breaks or crumbles, throw it away and take a new tablet out of the blister pack. Place the tablet in the mouth and allow it to dissolve, and then swallow. Do not cut, crush, or chew this medicine. You do not need water to take this medicine. Talk to your pediatrician about the use of this medicine in children. Special care may be needed. Overdosage: If you think you have taken too much of this medicine contact a poison control center or emergency room at once. NOTE: This medicine is only for you. Do not share this medicine with others. What if I miss a dose? This does not apply. This medicine is not for regular use. What may interact with this medicine? This medicine may interact with the following medications:  certain medicines for fungal infections like fluconazole,  itraconazole  rifampin This list may not describe all possible interactions. Give your health care provider a list of all the medicines, herbs, non-prescription drugs, or dietary supplements you use. Also tell them if you smoke, drink alcohol, or use illegal drugs. Some items may interact with your medicine. What should I watch for while using this medicine? Visit your health care professional for regular checks on your progress. Tell your health care professional if your symptoms do not start to get better or if they get worse. What side effects may I notice from receiving this medicine? Side effects that you should report to your doctor or health care professional as soon as possible:  allergic reactions like skin rash, itching or hives; swelling of the face, lips, or tongue Side effects that usually do not require medical attention (report these to your doctor or health care professional if they continue or are bothersome):  nausea This list may not describe all possible side effects. Call your doctor for medical advice about side effects. You may report side effects to FDA at 1-800-FDA-1088. Where should I keep my medicine? Keep out of the reach of children. Store at room temperature between 15 and 30 degrees C (59 and 86 degrees F). Throw away any unused medicine after the expiration date. NOTE: This sheet is a summary. It may not cover all possible information. If you have questions about this medicine, talk to your doctor, pharmacist, or health care provider.  2020 Elsevier/Gold Standard (2018-07-11 00:21:31)   Migraine Headache A  migraine headache is a very strong throbbing pain on one side or both sides of your head. This type of headache can also cause other symptoms. It can last from 4 hours to 3 days. Talk with your doctor about what things may bring on (trigger) this condition. What are the causes? The exact cause of this condition is not known. This condition may be triggered  or caused by:  Drinking alcohol.  Smoking.  Taking medicines, such as: ? Medicine used to treat chest pain (nitroglycerin). ? Birth control pills. ? Estrogen. ? Some blood pressure medicines.  Eating or drinking certain products.  Doing physical activity. Other things that may trigger a migraine headache include:  Having a menstrual period.  Pregnancy.  Hunger.  Stress.  Not getting enough sleep or getting too much sleep.  Weather changes.  Tiredness (fatigue). What increases the risk?  Being 35-42 years old.  Being female.  Having a family history of migraine headaches.  Being Caucasian.  Having depression or anxiety.  Being very overweight. What are the signs or symptoms?  A throbbing pain. This pain may: ? Happen in any area of the head, such as on one side or both sides. ? Make it hard to do daily activities. ? Get worse with physical activity. ? Get worse around bright lights or loud noises.  Other symptoms may include: ? Feeling sick to your stomach (nauseous). ? Vomiting. ? Dizziness. ? Being sensitive to bright lights, loud noises, or smells.  Before you get a migraine headache, you may get warning signs (an aura). An aura may include: ? Seeing flashing lights or having blind spots. ? Seeing bright spots, halos, or zigzag lines. ? Having tunnel vision or blurred vision. ? Having numbness or a tingling feeling. ? Having trouble talking. ? Having weak muscles.  Some people have symptoms after a migraine headache (postdromal phase), such as: ? Tiredness. ? Trouble thinking (concentrating). How is this treated?  Taking medicines that: ? Relieve pain. ? Relieve the feeling of being sick to your stomach. ? Prevent migraine headaches.  Treatment may also include: ? Having acupuncture. ? Avoiding foods that bring on migraine headaches. ? Learning ways to control your body functions (biofeedback). ? Therapy to help you know and deal with  negative thoughts (cognitive behavioral therapy). Follow these instructions at home: Medicines  Take over-the-counter and prescription medicines only as told by your doctor.  Ask your doctor if the medicine prescribed to you: ? Requires you to avoid driving or using heavy machinery. ? Can cause trouble pooping (constipation). You may need to take these steps to prevent or treat trouble pooping:  Drink enough fluid to keep your pee (urine) pale yellow.  Take over-the-counter or prescription medicines.  Eat foods that are high in fiber. These include beans, whole grains, and fresh fruits and vegetables.  Limit foods that are high in fat and sugar. These include fried or sweet foods. Lifestyle  Do not drink alcohol.  Do not use any products that contain nicotine or tobacco, such as cigarettes, e-cigarettes, and chewing tobacco. If you need help quitting, ask your doctor.  Get at least 8 hours of sleep every night.  Limit and deal with stress. General instructions      Keep a journal to find out what may bring on your migraine headaches. For example, write down: ? What you eat and drink. ? How much sleep you get. ? Any change in what you eat or drink. ? Any change in  your medicines.  If you have a migraine headache: ? Avoid things that make your symptoms worse, such as bright lights. ? It may help to lie down in a dark, quiet room. ? Do not drive or use heavy machinery. ? Ask your doctor what activities are safe for you.  Keep all follow-up visits as told by your doctor. This is important. Contact a doctor if:  You get a migraine headache that is different or worse than others you have had.  You have more than 15 headache days in one month. Get help right away if:  Your migraine headache gets very bad.  Your migraine headache lasts longer than 72 hours.  You have a fever.  You have a stiff neck.  You have trouble seeing.  Your muscles feel weak or like you  cannot control them.  You start to lose your balance a lot.  You start to have trouble walking.  You pass out (faint).  You have a seizure. Summary  A migraine headache is a very strong throbbing pain on one side or both sides of your head. These headaches can also cause other symptoms.  This condition may be treated with medicines and changes to your lifestyle.  Keep a journal to find out what may bring on your migraine headaches.  Contact a doctor if you get a migraine headache that is different or worse than others you have had.  Contact your doctor if you have more than 15 headache days in a month. This information is not intended to replace advice given to you by your health care provider. Make sure you discuss any questions you have with your health care provider. Document Revised: 08/19/2018 Document Reviewed: 06/09/2018 Elsevier Patient Education  2020 [employer].

## 2019-12-04 NOTE — Patient Instructions (Signed)
We will be in touch with the urine culture results once received.  Continue to drink plenty of water.   It was a pleasure meeting you!

## 2019-12-06 LAB — URINE CULTURE

## 2019-12-07 NOTE — Telephone Encounter (Signed)
Completed.

## 2019-12-07 NOTE — Telephone Encounter (Signed)
Spoke with pt she stated she would like her time changed from 3 times per 30 days to 5 times per 30 days for migranes  Paperwork in dr Ermalene Searing in box for review and signature

## 2019-12-08 NOTE — Telephone Encounter (Signed)
PAPERWORK FAXED

## 2019-12-18 ENCOUNTER — Other Ambulatory Visit: Payer: Self-pay | Admitting: *Deleted

## 2019-12-18 MED ORDER — FLUOXETINE HCL 40 MG PO CAPS: 40.0000 mg | ORAL_CAPSULE | Freq: Every day | ORAL | 0 refills | Status: DC

## 2019-12-20 ENCOUNTER — Ambulatory Visit (INDEPENDENT_AMBULATORY_CARE_PROVIDER_SITE_OTHER): Payer: 59 | Admitting: Psychology

## 2019-12-20 ENCOUNTER — Encounter: Payer: Self-pay | Admitting: Family Medicine

## 2019-12-20 DIAGNOSIS — F411 Generalized anxiety disorder: Secondary | ICD-10-CM

## 2019-12-20 DIAGNOSIS — F4321 Adjustment disorder with depressed mood: Secondary | ICD-10-CM

## 2019-12-21 MED ORDER — EMGALITY 120 MG/ML ~~LOC~~ SOAJ: SUBCUTANEOUS | 5 refills | Status: DC

## 2020-01-03 ENCOUNTER — Telehealth: Payer: Self-pay | Admitting: Family Medicine

## 2020-01-04 ENCOUNTER — Other Ambulatory Visit: Payer: Self-pay | Admitting: Family Medicine

## 2020-01-04 NOTE — Telephone Encounter (Signed)
Please schedule CPE with fasting labs prior for Dr. Ermalene Searing in September.

## 2020-01-05 NOTE — Telephone Encounter (Signed)
Labs 9/24 cpx 9/28 Pt aware

## 2020-01-10 ENCOUNTER — Ambulatory Visit (INDEPENDENT_AMBULATORY_CARE_PROVIDER_SITE_OTHER): Payer: 59 | Admitting: Psychology

## 2020-01-10 DIAGNOSIS — F411 Generalized anxiety disorder: Secondary | ICD-10-CM | POA: Diagnosis not present

## 2020-01-10 DIAGNOSIS — F4321 Adjustment disorder with depressed mood: Secondary | ICD-10-CM

## 2020-01-18 ENCOUNTER — Telehealth: Payer: Self-pay | Admitting: Family Medicine

## 2020-01-18 ENCOUNTER — Other Ambulatory Visit: Payer: Self-pay | Admitting: Family Medicine

## 2020-01-18 NOTE — Telephone Encounter (Signed)
Placed in work que. 

## 2020-01-18 NOTE — Telephone Encounter (Signed)
Pt called wanting to know the update on the PA for her Galcanezumab-gnlm (EMGALITY) 120 MG/ML SOAJ Please advise.

## 2020-01-23 NOTE — Telephone Encounter (Signed)
PA has been started on [email or web address]. Key is [id number]. Per [email or web address], determination will be received in 1-3 business days. Will await determination.

## 2020-01-23 NOTE — Telephone Encounter (Signed)
Per [email or web address], PA has been approved until 01/22/21. Spoke with patient, she is aware that it has been approved.   Nothing further needed at time of call.

## 2020-02-01 ENCOUNTER — Telehealth: Payer: Self-pay | Admitting: Family Medicine

## 2020-02-01 ENCOUNTER — Other Ambulatory Visit: Payer: Self-pay | Admitting: Family Medicine

## 2020-02-01 DIAGNOSIS — D72829 Elevated white blood cell count, unspecified: Secondary | ICD-10-CM

## 2020-02-01 DIAGNOSIS — I10 Essential (primary) hypertension: Secondary | ICD-10-CM

## 2020-02-01 DIAGNOSIS — R7303 Prediabetes: Secondary | ICD-10-CM

## 2020-02-01 NOTE — Telephone Encounter (Signed)
-----   Message from Aquilla Solian, RT sent at 01/18/2020 10:12 AM EDT ----- Regarding: Lab Orders for Friday 9.24.2021 Please place lab orders for Friday 9.24.2021, office visit for physical on Tuesday 9.28.2021 Thank you, Jones Bales RT(R)

## 2020-02-02 ENCOUNTER — Other Ambulatory Visit: Payer: Self-pay

## 2020-02-02 ENCOUNTER — Other Ambulatory Visit (INDEPENDENT_AMBULATORY_CARE_PROVIDER_SITE_OTHER): Payer: Managed Care, Other (non HMO)

## 2020-02-02 DIAGNOSIS — R7303 Prediabetes: Secondary | ICD-10-CM

## 2020-02-02 DIAGNOSIS — D72829 Elevated white blood cell count, unspecified: Secondary | ICD-10-CM

## 2020-02-02 DIAGNOSIS — I10 Essential (primary) hypertension: Secondary | ICD-10-CM

## 2020-02-02 NOTE — Addendum Note (Signed)
Addended by: Rylie Knierim P on: 02/02/2020 08:47 AM   Modules accepted: Orders  

## 2020-02-02 NOTE — Addendum Note (Signed)
Addended by: Krithik Mapel P on: 02/02/2020 08:47 AM   Modules accepted: Orders  

## 2020-02-02 NOTE — Addendum Note (Signed)
Addended by: Eual Fines on: 02/02/2020 08:47 AM   Modules accepted: Orders

## 2020-02-02 NOTE — Addendum Note (Signed)
Addended by: Darcia Lampi P on: 02/02/2020 08:47 AM   Modules accepted: Orders  

## 2020-02-03 LAB — CBC WITH DIFFERENTIAL/PLATELET
Basophils Absolute: 0 10*3/uL (ref 0.0–0.2)
Basos: 1 %
EOS (ABSOLUTE): 0.1 10*3/uL (ref 0.0–0.4)
Eos: 2 %
Hematocrit: 38 % (ref 34.0–46.6)
Hemoglobin: 12.4 g/dL (ref 11.1–15.9)
Immature Grans (Abs): 0.1 10*3/uL (ref 0.0–0.1)
Immature Granulocytes: 1 %
Lymphocytes Absolute: 2.7 10*3/uL (ref 0.7–3.1)
Lymphs: 36 %
MCH: 30.8 pg (ref 26.6–33.0)
MCHC: 32.6 g/dL (ref 31.5–35.7)
MCV: 94 fL (ref 79–97)
Monocytes Absolute: 0.8 10*3/uL (ref 0.1–0.9)
Monocytes: 11 %
Neutrophils Absolute: 3.8 10*3/uL (ref 1.4–7.0)
Neutrophils: 49 %
Platelets: 355 10*3/uL (ref 150–450)
RBC: 4.03 x10E6/uL (ref 3.77–5.28)
RDW: 12.8 % (ref 11.7–15.4)
WBC: 7.5 10*3/uL (ref 3.4–10.8)

## 2020-02-03 LAB — COMPREHENSIVE METABOLIC PANEL
ALT: 37 IU/L — ABNORMAL HIGH (ref 0–32)
AST: 35 IU/L (ref 0–40)
Albumin/Globulin Ratio: 1.7 (ref 1.2–2.2)
Albumin: 4.7 g/dL (ref 3.8–4.8)
Alkaline Phosphatase: 55 IU/L (ref 44–121)
BUN/Creatinine Ratio: 16 (ref 9–23)
BUN: 12 mg/dL (ref 6–20)
Bilirubin Total: 0.4 mg/dL (ref 0.0–1.2)
CO2: 24 mmol/L (ref 20–29)
Calcium: 9.3 mg/dL (ref 8.7–10.2)
Chloride: 97 mmol/L (ref 96–106)
Creatinine, Ser: 0.77 mg/dL (ref 0.57–1.00)
GFR calc Af Amer: 116 mL/min/{1.73_m2} (ref >59)
GFR calc non Af Amer: 100 mL/min/{1.73_m2} (ref >59)
Globulin, Total: 2.8 g/dL (ref 1.5–4.5)
Glucose: 98 mg/dL (ref 65–99)
Potassium: 4 mmol/L (ref 3.5–5.2)
Sodium: 136 mmol/L (ref 134–144)
Total Protein: 7.5 g/dL (ref 6.0–8.5)

## 2020-02-03 LAB — LIPID PANEL
Chol/HDL Ratio: 2.8 ratio (ref 0.0–4.4)
Cholesterol, Total: 152 mg/dL (ref 100–199)
HDL: 55 mg/dL (ref >39)
LDL Chol Calc (NIH): 76 mg/dL (ref 0–99)
Triglycerides: 118 mg/dL (ref 0–149)
VLDL Cholesterol Cal: 21 mg/dL (ref 5–40)

## 2020-02-03 LAB — HEMOGLOBIN A1C
Est. average glucose Bld gHb Est-mCnc: 111 mg/dL
Hgb A1c MFr Bld: 5.5 % (ref 4.8–5.6)

## 2020-02-05 NOTE — Progress Notes (Signed)
No critical labs need to be addressed urgently. We will discuss labs in detail at upcoming office visit.   

## 2020-02-06 ENCOUNTER — Encounter: Payer: Managed Care, Other (non HMO) | Admitting: Family Medicine

## 2020-02-07 ENCOUNTER — Ambulatory Visit (INDEPENDENT_AMBULATORY_CARE_PROVIDER_SITE_OTHER): Payer: 59 | Admitting: Psychology

## 2020-02-07 DIAGNOSIS — F4320 Adjustment disorder, unspecified: Secondary | ICD-10-CM

## 2020-02-07 DIAGNOSIS — F411 Generalized anxiety disorder: Secondary | ICD-10-CM

## 2020-02-13 ENCOUNTER — Encounter: Payer: Self-pay | Admitting: Family Medicine

## 2020-02-13 ENCOUNTER — Other Ambulatory Visit: Payer: Self-pay

## 2020-02-13 ENCOUNTER — Ambulatory Visit (INDEPENDENT_AMBULATORY_CARE_PROVIDER_SITE_OTHER): Payer: Managed Care, Other (non HMO) | Admitting: Family Medicine

## 2020-02-13 VITALS — BP 120/68 | HR 92 | Temp 98.0°F | Ht 63.75 in | Wt 165.8 lb

## 2020-02-13 DIAGNOSIS — Z0000 Encounter for general adult medical examination without abnormal findings: Secondary | ICD-10-CM

## 2020-02-13 DIAGNOSIS — R7303 Prediabetes: Secondary | ICD-10-CM | POA: Diagnosis not present

## 2020-02-13 DIAGNOSIS — Z23 Encounter for immunization: Secondary | ICD-10-CM | POA: Diagnosis not present

## 2020-02-13 DIAGNOSIS — F321 Major depressive disorder, single episode, moderate: Secondary | ICD-10-CM

## 2020-02-13 DIAGNOSIS — I10 Essential (primary) hypertension: Secondary | ICD-10-CM

## 2020-02-13 NOTE — Patient Instructions (Addendum)
Work on regular exercise. Keep up with healthy eating.   Preventive Care 53-35 Years Old, Female Preventive care refers to visits with your health care provider and lifestyle choices that can promote health and wellness. This includes:  A yearly physical exam. This may also be called an annual well check.  Regular dental visits and eye exams.  Immunizations.  Screening for certain conditions.  Healthy lifestyle choices, such as eating a healthy diet, getting regular exercise, not using drugs or products that contain nicotine and tobacco, and limiting alcohol use. What can I expect for my preventive care visit? Physical exam Your health care provider will check your:  Height and weight. This may be used to calculate body mass index (BMI), which tells if you are at a healthy weight.  Heart rate and blood pressure.  Skin for abnormal spots. Counseling Your health care provider may ask you questions about your:  Alcohol, tobacco, and drug use.  Emotional well-being.  Home and relationship well-being.  Sexual activity.  Eating habits.  Work and work Statistician.  Method of birth control.  Menstrual cycle.  Pregnancy history. What immunizations do I need?  Influenza (flu) vaccine  This is recommended every year. Tetanus, diphtheria, and pertussis (Tdap) vaccine  You may need a Td booster every 10 years. Varicella (chickenpox) vaccine  You may need this if you have not been vaccinated. Human papillomavirus (HPV) vaccine  If recommended by your health care provider, you may need three doses over 6 months. Measles, mumps, and rubella (MMR) vaccine  You may need at least one dose of MMR. You may also need a second dose. Meningococcal conjugate (MenACWY) vaccine  One dose is recommended if you are age 93-21 years and a first-year college student living in a residence hall, or if you have one of several medical conditions. You may also need additional booster  doses. Pneumococcal conjugate (PCV13) vaccine  You may need this if you have certain conditions and were not previously vaccinated. Pneumococcal polysaccharide (PPSV23) vaccine  You may need one or two doses if you smoke cigarettes or if you have certain conditions. Hepatitis A vaccine  You may need this if you have certain conditions or if you travel or work in places where you may be exposed to hepatitis A. Hepatitis B vaccine  You may need this if you have certain conditions or if you travel or work in places where you may be exposed to hepatitis B. Haemophilus influenzae type b (Hib) vaccine  You may need this if you have certain conditions. You may receive vaccines as individual doses or as more than one vaccine together in one shot (combination vaccines). Talk with your health care provider about the risks and benefits of combination vaccines. What tests do I need?  Blood tests  Lipid and cholesterol levels. These may be checked every 5 years starting at age 74.  Hepatitis C test.  Hepatitis B test. Screening  Diabetes screening. This is done by checking your blood sugar (glucose) after you have not eaten for a while (fasting).  Sexually transmitted disease (STD) testing.  BRCA-related cancer screening. This may be done if you have a family history of breast, ovarian, tubal, or peritoneal cancers.  Pelvic exam and Pap test. This may be done every 3 years starting at age 106. Starting at age 35, this may be done every 5 years if you have a Pap test in combination with an HPV test. Talk with your health care provider about your test  results, treatment options, and if necessary, the need for more tests. Follow these instructions at home: Eating and drinking   Eat a diet that includes fresh fruits and vegetables, whole grains, lean protein, and low-fat dairy.  Take vitamin and mineral supplements as recommended by your health care provider.  Do not drink alcohol  if: ? Your health care provider tells you not to drink. ? You are pregnant, may be pregnant, or are planning to become pregnant.  If you drink alcohol: ? Limit how much you have to 0-1 drink a day. ? Be aware of how much alcohol is in your drink. In the [place] one drink equals one 12 oz bottle of beer (355 mL), one 5 oz glass of wine (148 mL), or one 1 oz glass of hard liquor (44 mL). Lifestyle  Take daily care of your teeth and gums.  Stay active. Exercise for at least 30 minutes on 5 or more days each week.  Do not use any products that contain nicotine or tobacco, such as cigarettes, e-cigarettes, and chewing tobacco. If you need help quitting, ask your health care provider.  If you are sexually active, practice safe sex. Use a condom or other form of birth control (contraception) in order to prevent pregnancy and STIs (sexually transmitted infections). If you plan to become pregnant, see your health care provider for a preconception visit. What's next?  Visit your health care provider once a year for a well check visit.  Ask your health care provider how often you should have your eyes and teeth checked.  Stay up to date on all vaccines. This information is not intended to replace advice given to you by your health care provider. Make sure you discuss any questions you have with your health care provider. Document Revised: 01/06/2018 Document Reviewed: 01/06/2018 Elsevier Patient Education  2020 [employer].

## 2020-02-13 NOTE — Assessment & Plan Note (Signed)
Excellent improvement! COnitnue current regimen.

## 2020-02-13 NOTE — Progress Notes (Signed)
Chief Complaint  Patient presents with  . Annual Exam    History of Present Illness: HPI  The patient is here for annual wellness exam and preventative care.     Reviewed labs in detail  WBC now in normal rnage  Hypertension:    Good control  on losartan, amlodipine, metoprolol BP Readings from Last 3 Encounters:  02/13/20 120/68  12/04/19 (!) 134/84  12/04/19 (!) 137/87  Using medication without problems or lightheadedness: one Chest pain with exertion:none Edema: none Short of breath: none Average home BPs: Other issues: Prediabetes , well controlled Lab Results  Component Value Date   HGBA1C 5.5 02/02/2020    Wt Readings from Last 3 Encounters:  02/13/20 165 lb 12 oz (75.2 kg)  12/04/19 163 lb 8 oz (74.2 kg)  12/04/19 167 lb (75.8 kg)  Body mass index is 28.67 kg/m.  Diet: working on healthy eating minimal exercise.   GAD, MDD: improved with counseling, coping mechanisms and job change.   Lab Results  Component Value Date   CHOL 152 02/02/2020   HDL 55 02/02/2020   LDLCALC 76 02/02/2020   TRIG 118 02/02/2020   CHOLHDL 2.8 02/02/2020    One LFT slightly increased again  This visit occurred during the SARS-CoV-2 public health emergency.  Safety protocols were in place, including screening questions prior to the visit, additional usage of staff PPE, and extensive cleaning of exam room while observing appropriate contact time as indicated for disinfecting solutions.   COVID 19 screen:  No recent travel or known exposure to COVID19 The patient denies respiratory symptoms of COVID 19 at this time. The importance of social distancing was discussed today.     Review of Systems  Constitutional: Negative for chills and fever.  HENT: Negative for congestion and ear pain.   Eyes: Negative for pain and redness.  Respiratory: Negative for cough and shortness of breath.   Cardiovascular: Negative for chest pain, palpitations and leg swelling.  Gastrointestinal:  Negative for abdominal pain, blood in stool, constipation, diarrhea, nausea and vomiting.  Genitourinary: Negative for dysuria.  Musculoskeletal: Negative for falls and myalgias.  Skin: Negative for rash.  Neurological: Negative for dizziness.  Psychiatric/Behavioral: Negative for depression. The patient is not nervous/anxious.       Past Medical History:  Diagnosis Date  . Abnormal Pap smear AGE 89   COLPO LAST PAP 08/2011  . Allergy   . Anemia   . Complication of anesthesia    NAUSEA; VOMITING; ITCHING; DIFFICULTY BREATHING  . Genital herpes   . GERD (gastroesophageal reflux disease) AGE 39   tums prn  . History of chicken pox   . Hypertension   . Infection AGE 49   CHLAMYDIA; GONORRHEA  . 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 Infection    BV  . Migraines   . Ovarian cyst rupture   . PONV (postoperative nausea and vomiting)   . Preterm labor   . Urinary incontinence     reports that she has never smoked. She has never used smokeless tobacco. She reports current alcohol use of about 3.0 standard drinks of alcohol per week. She reports that she does not use drugs.   Current Outpatient Medications:  .  amLODipine (NORVASC) 10 MG tablet, TAKE 1 TABLET BY MOUTH  DAILY, Disp: 90 tablet, Rfl: 0 .  cyclobenzaprine (FLEXERIL) 10 MG tablet, TAKE 1 TABLET (10 MG TOTAL) BY MOUTH AT BEDTIME AS NEEDED FOR MUSCLE SPASMS., Disp: 15 tablet, Rfl: 0 .  FLUoxetine (PROZAC) 40 MG capsule, TAKE 1 CAPSULE BY MOUTH  DAILY, Disp: 90 capsule, Rfl: 3 .  Galcanezumab-gnlm (EMGALITY) 120 MG/ML SOAJ, 1INJECT 120MG  INTO THE SKIN EVERY 30 DAYS, Disp: 1 mL, Rfl: 5 .  hydrOXYzine (ATARAX/VISTARIL) 10 MG tablet, Take 1 tablet (10 mg total) by mouth 3 (three) times daily as needed for anxiety., Disp: 30 tablet, Rfl: 0 .  losartan (COZAAR) 100 MG tablet, TAKE 1 TABLET BY MOUTH  DAILY, Disp: 90 tablet, Rfl: 3 .  metoprolol succinate (TOPROL-XL) 25 MG 24 hr tablet, TAKE 1 TABLET BY MOUTH  DAILY, Disp: 90 tablet, Rfl: 1 .   pantoprazole (PROTONIX) 40 MG tablet, Take 40 mg by mouth daily., Disp: , Rfl:  .  potassium chloride SA (KLOR-CON) 20 MEQ tablet, TAKE 1 TABLET BY MOUTH  EVERY SUNDAY, MONDAY,  WEDNESDAY, AND FRIDAY, Disp: 48 tablet, Rfl: 0 .  promethazine (PHENERGAN) 25 MG tablet, Take 1 tablet (25 mg total) by mouth every 6 (six) hours as needed for nausea or vomiting., Disp: 30 tablet, Rfl: 2 .  tolterodine (DETROL LA) 4 MG 24 hr capsule, TAKE 1 CAPSULE BY MOUTH  DAILY, Disp: 90 capsule, Rfl: 0 .  traZODone (DESYREL) 50 MG tablet, Take 0.5-1 tablets (25-50 mg total) by mouth at bedtime as needed for sleep., Disp: 30 tablet, Rfl: 3 .  valACYclovir (VALTREX) 1000 MG tablet, Take 1 tablet (1,000 mg total) by mouth 3 (three) times daily., Disp: 21 tablet, Rfl: 0   Observations/Objective: Blood pressure 120/68, pulse 92, temperature 98 F (36.7 C), temperature source Temporal, height 5' 3.75" (1.619 m), weight 165 lb 12 oz (75.2 kg), SpO2 98 %.  Physical Exam Constitutional:      General: She is not in acute distress.    Appearance: Normal appearance. She is well-developed. She is not ill-appearing or toxic-appearing.  HENT:     Head: Normocephalic.     Right Ear: Hearing, tympanic membrane, ear canal and external ear normal.     Left Ear: Hearing, tympanic membrane, ear canal and external ear normal.     Nose: Nose normal.  Eyes:     General: Lids are normal. Lids are everted, no foreign bodies appreciated.     Conjunctiva/sclera: Conjunctivae normal.     Pupils: Pupils are equal, round, and reactive to light.  Neck:     Thyroid: No thyroid mass or thyromegaly.     Vascular: No carotid bruit.     Trachea: Trachea normal.  Cardiovascular:     Rate and Rhythm: Normal rate and regular rhythm.     Heart sounds: Normal heart sounds, S1 normal and S2 normal. No murmur heard.  No gallop.   Pulmonary:     Effort: Pulmonary effort is normal. No respiratory distress.     Breath sounds: Normal breath sounds.  No wheezing, rhonchi or rales.  Abdominal:     General: Bowel sounds are normal. There is no distension or abdominal bruit.     Palpations: Abdomen is soft. There is no fluid wave or mass.     Tenderness: There is no abdominal tenderness. There is no guarding or rebound.     Hernia: No hernia is present.  Musculoskeletal:     Cervical back: Normal range of motion and neck supple.  Lymphadenopathy:     Cervical: No cervical adenopathy.  Skin:    General: Skin is warm and dry.     Findings: No rash.  Neurological:     Mental Status: She is alert.  Cranial Nerves: No cranial nerve deficit.     Sensory: No sensory deficit.  Psychiatric:        Mood and Affect: Mood is not anxious or depressed.        Speech: Speech normal.        Behavior: Behavior normal. Behavior is cooperative.        Judgment: Judgment normal.      Assessment and Plan The patient's preventative maintenance and recommended screening tests for an annual wellness exam were reviewed in full today. Brought up to date unless services declined.  Counselled on the importance of diet, exercise, and its role in overall health and mortality. The patient's FH and SH was reviewed, including their home life, tobacco status, and drug and alcohol status.     Vaccines: Given with flu and  uptodateTdap . S/P COVID Pelvic: DVE/pap;last pap 2017 nml, no high risk HPV, repeat in 5 years. No early family history of colon, uterine , ovarianor breast cancer. Nonsmoker HIV/STD: refused.  Prediabetes Resolved.  HTN (hypertension), benign Well controlled. Continue current medication.   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 COnitnue current regimen.     [name], MD

## 2020-02-13 NOTE — Assessment & Plan Note (Signed)
Resolved

## 2020-02-13 NOTE — Assessment & Plan Note (Signed)
Well controlled. Continue current medication.  

## 2020-02-21 ENCOUNTER — Other Ambulatory Visit: Payer: Self-pay | Admitting: Family Medicine

## 2020-03-06 ENCOUNTER — Ambulatory Visit (INDEPENDENT_AMBULATORY_CARE_PROVIDER_SITE_OTHER): Payer: 59 | Admitting: Psychology

## 2020-03-06 DIAGNOSIS — F4321 Adjustment disorder with depressed mood: Secondary | ICD-10-CM | POA: Diagnosis not present

## 2020-03-06 DIAGNOSIS — F411 Generalized anxiety disorder: Secondary | ICD-10-CM | POA: Diagnosis not present

## 2020-03-15 ENCOUNTER — Telehealth: Payer: Self-pay | Admitting: Family Medicine

## 2020-03-15 NOTE — Telephone Encounter (Signed)
Left message for Samina to return my call in regard to a phone call we received from someone named Brayton Caves who was requesting refills for her.  He said he was her employer.

## 2020-03-15 NOTE — Telephone Encounter (Signed)
Angela [name] called stating pt needs refills sent to [facility]  Fluoxetine hco 40mg  1 daily Metoprolol succinate 25mg  klor-con m20 er   stated he is employer

## 2020-03-18 MED ORDER — POTASSIUM CHLORIDE CRYS ER 20 MEQ PO TBCR: EXTENDED_RELEASE_TABLET | ORAL | 1 refills | Status: DC

## 2020-03-18 MED ORDER — FLUOXETINE HCL 40 MG PO CAPS
40.0000 mg | ORAL_CAPSULE | Freq: Every day | ORAL | 1 refills | Status: DC
Start: 2020-03-18 — End: 2020-03-29

## 2020-03-18 MED ORDER — METOPROLOL SUCCINATE ER 25 MG PO TB24
25.0000 mg | ORAL_TABLET | Freq: Every day | ORAL | 1 refills | Status: DC
Start: 2020-03-18 — End: 2020-03-29

## 2020-03-18 NOTE — Telephone Encounter (Signed)
Spoke with pt  She stated this is correct.  [facility] to [facility]

## 2020-03-18 NOTE — Telephone Encounter (Signed)
Refills sent as requested to [facility].

## 2020-03-22 NOTE — Telephone Encounter (Signed)
Angela [name] called in wanted to know if someone could call [facility] due to they have not received the information about the prescriptions.

## 2020-03-22 NOTE — Telephone Encounter (Signed)
Spoke to pt. This is her benefits advocate. She appreciated that we checked with her before speaking to them. I advised her that her meds were sent to [facility] as requested.

## 2020-03-27 ENCOUNTER — Other Ambulatory Visit: Payer: Self-pay | Admitting: Family Medicine

## 2020-03-27 MED ORDER — EMGALITY 120 MG/ML ~~LOC~~ SOAJ: SUBCUTANEOUS | 5 refills | Status: DC

## 2020-03-27 NOTE — Addendum Note (Signed)
Addended by: Shawnie Dapper L on: 03/27/2020 04:14 PM   Modules accepted: Orders

## 2020-03-27 NOTE — Telephone Encounter (Signed)
Patient called in and needs refill on her Em[employer]. [facility] [address] States that the pharmacy had contacted [place] about the pre authorization.

## 2020-03-27 NOTE — Telephone Encounter (Signed)
This is normally filled by her neurologist.  Will forward to Shawnie Dapper, NP.

## 2020-03-27 NOTE — Addendum Note (Signed)
Addended by: Damita Lack on: 03/27/2020 02:29 PM   Modules accepted: Orders

## 2020-03-29 MED ORDER — METOPROLOL SUCCINATE ER 25 MG PO TB24
25.0000 mg | ORAL_TABLET | Freq: Every day | ORAL | 1 refills | Status: DC
Start: 2020-03-29 — End: 2020-09-11

## 2020-03-29 MED ORDER — POTASSIUM CHLORIDE CRYS ER 20 MEQ PO TBCR: EXTENDED_RELEASE_TABLET | ORAL | 1 refills | Status: DC

## 2020-03-29 MED ORDER — FLUOXETINE HCL 40 MG PO CAPS: 40.0000 mg | ORAL_CAPSULE | Freq: Every day | ORAL | 1 refills | Status: DC

## 2020-03-29 NOTE — Telephone Encounter (Signed)
Brayton Caves called back stating [facility] does not have any rx  rx can be faxed  [phone]  [phone] phone number  Can be sent electronic  To [facility]

## 2020-03-29 NOTE — Addendum Note (Signed)
Addended by: Damita Lack on: 03/29/2020 12:14 PM   Modules accepted: Orders

## 2020-03-29 NOTE — Telephone Encounter (Signed)
Looks like refills were originally sent to [facility].  Resent prescriptions to [facility] as requested.

## 2020-04-02 ENCOUNTER — Other Ambulatory Visit: Payer: Self-pay | Admitting: Family Medicine

## 2020-04-04 ENCOUNTER — Other Ambulatory Visit: Payer: Self-pay | Admitting: Family Medicine

## 2020-04-08 ENCOUNTER — Encounter: Payer: Self-pay | Admitting: Family Medicine

## 2020-04-09 NOTE — Telephone Encounter (Signed)
Initiated CMM KEY # [id number] [employer] BIN [id number], PCN [id number] GRP [id number].   [facility]. Tried ajovym metotprolol, labetalol, imitrex, eletriptan, diclofenac, flexeril, fluoxetine, phenergan. topamax ordered pt never p/u due to SE.

## 2020-04-11 NOTE — Telephone Encounter (Signed)
Approval received Case ID [id number] Em[employer]ality 04-09-20 thru 04-10-2021. [facility] [phone].

## 2020-06-14 IMAGING — US US ABDOMEN LIMITED
1 series · 14 of 25 positions shown · non-contrast
Comparison: CT abdomen/pelvis 05/29/2015

CLINICAL DATA: Right upper quadrant pain. Right upper quadrant
pain, evaluate for gallstones. Additional history provided by
scanning technologist: Right upper quadrant pain for 3-4 months.

EXAM:
ULTRASOUND ABDOMEN LIMITED RIGHT UPPER QUADRANT

[Series 1: us abdomen limited · 0.13mm/px · 14 of 45 slices shown]
[im 1/45]
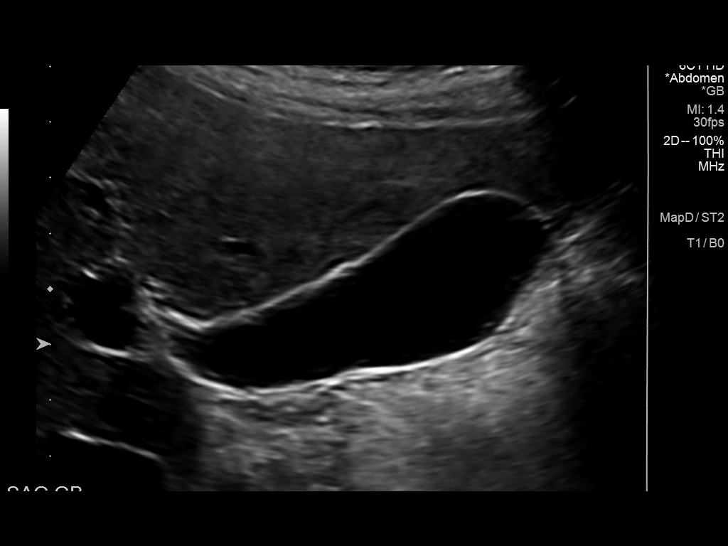
[im 4/45]
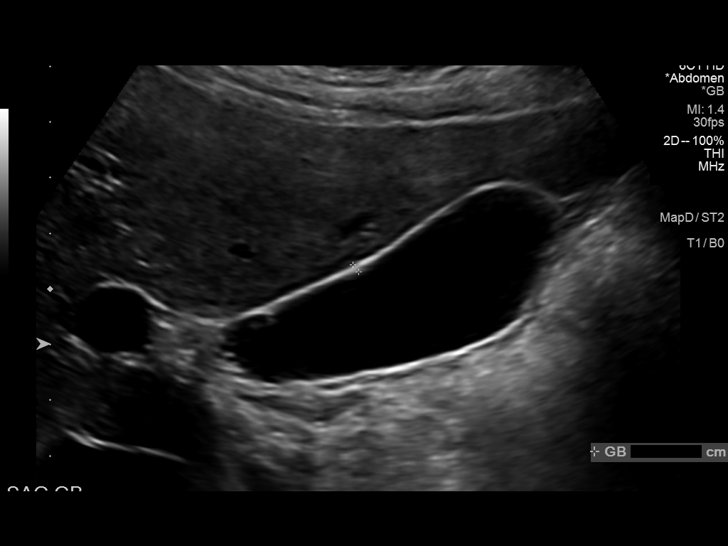
[im 8/45]
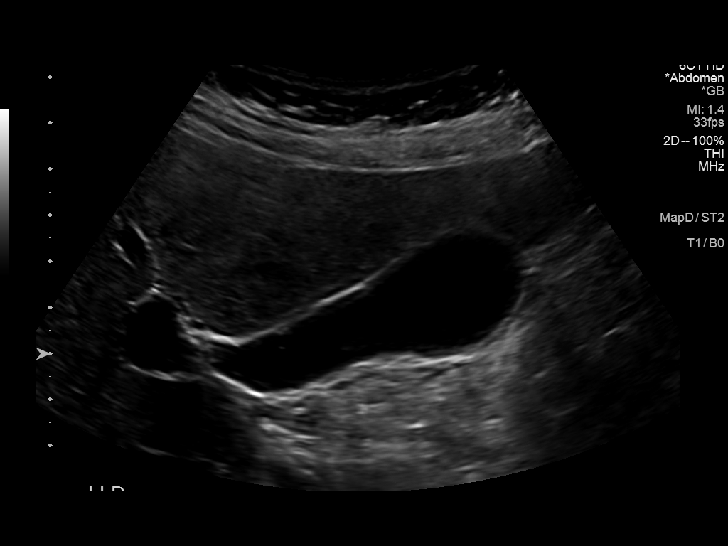
[im 12/45]
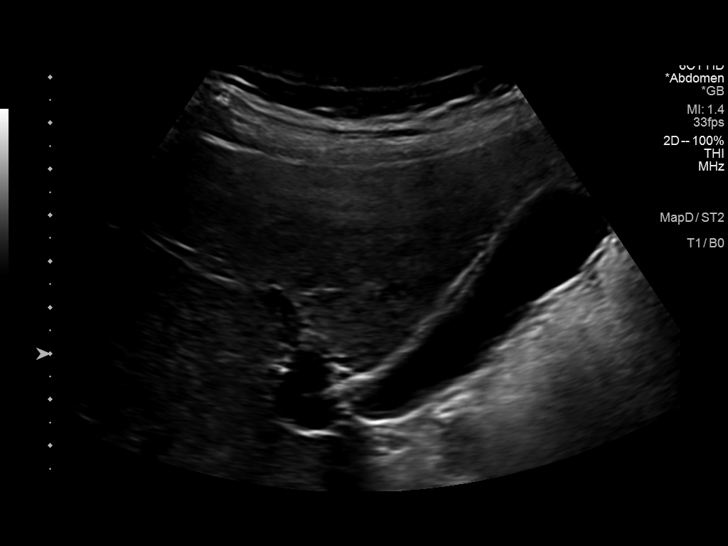
[im 15/45]
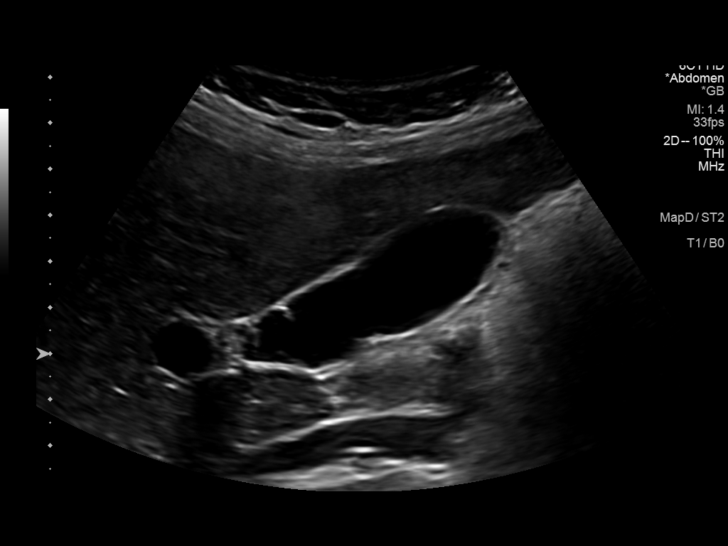
[im 17/45]
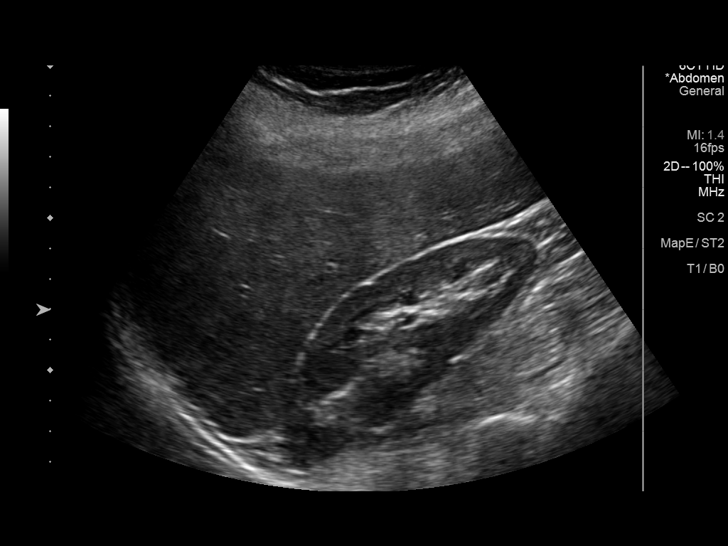
[im 21/45]
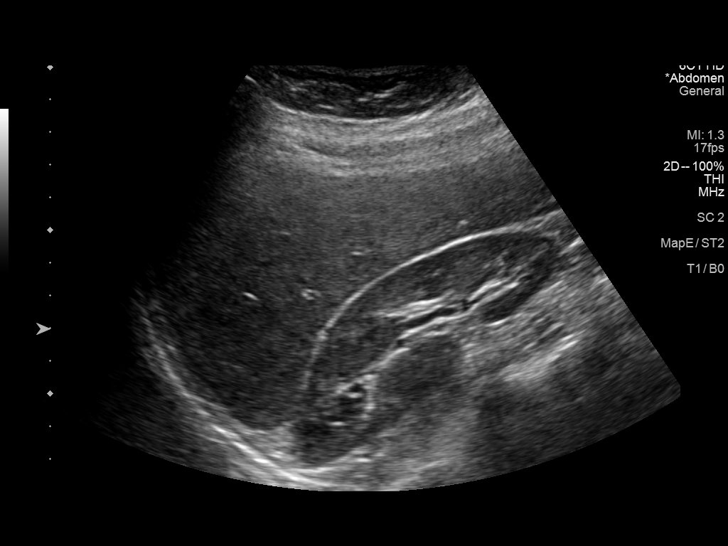
[im 24/45]
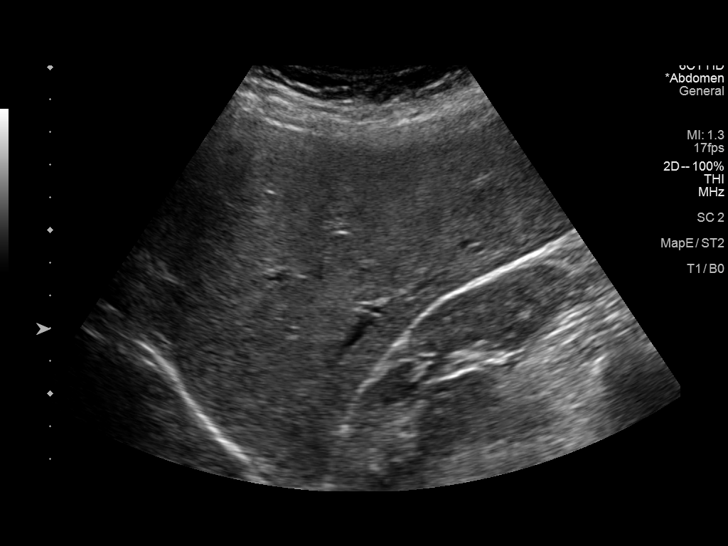
[im 28/45]
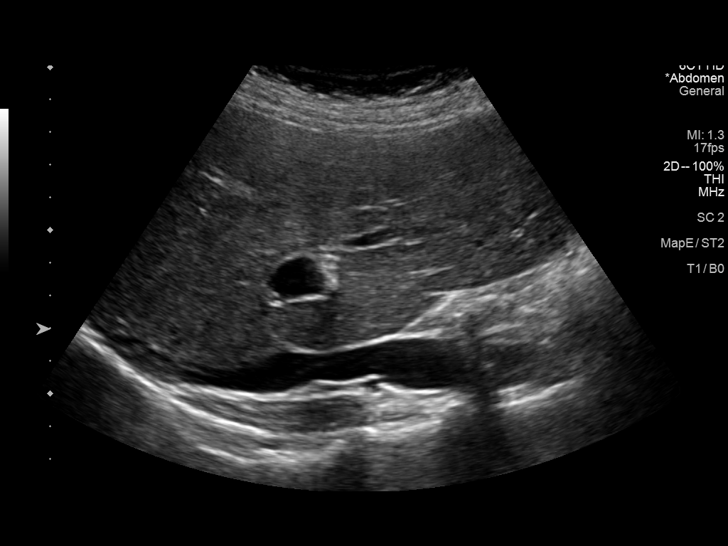
[im 30/45]
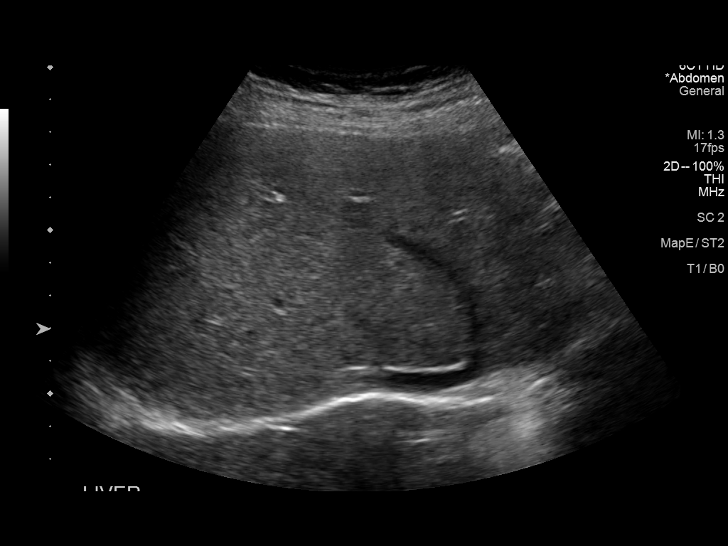
[im 34/45]
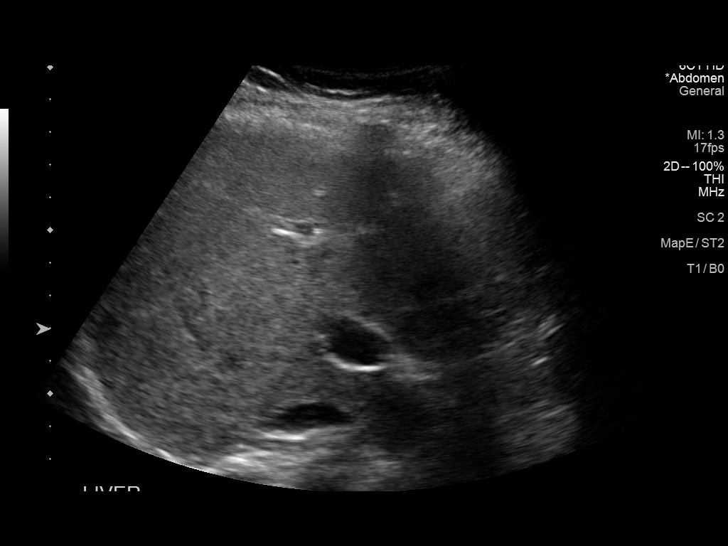
[im 37/45]
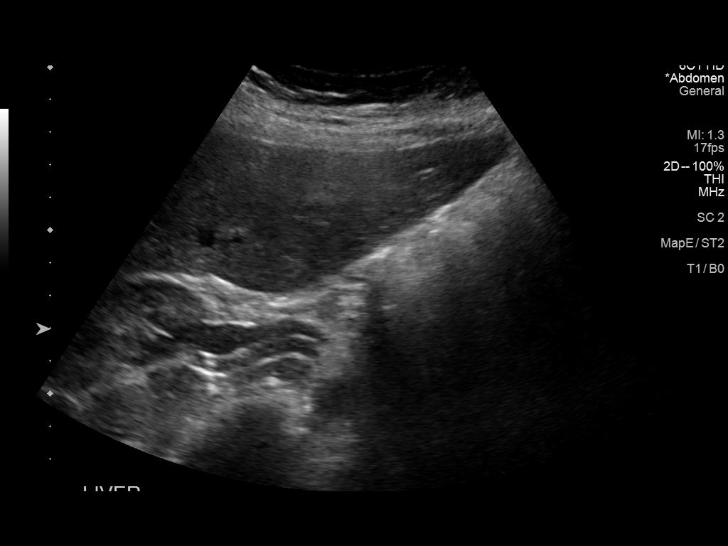
[im 41/45]
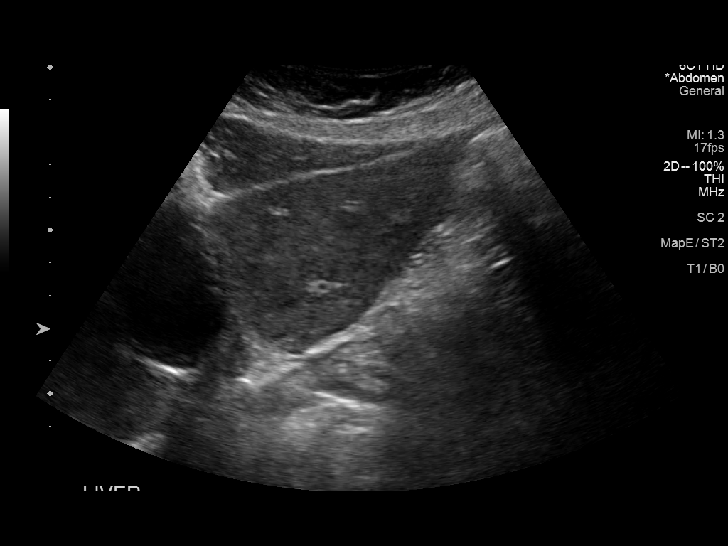
[im 45/45]
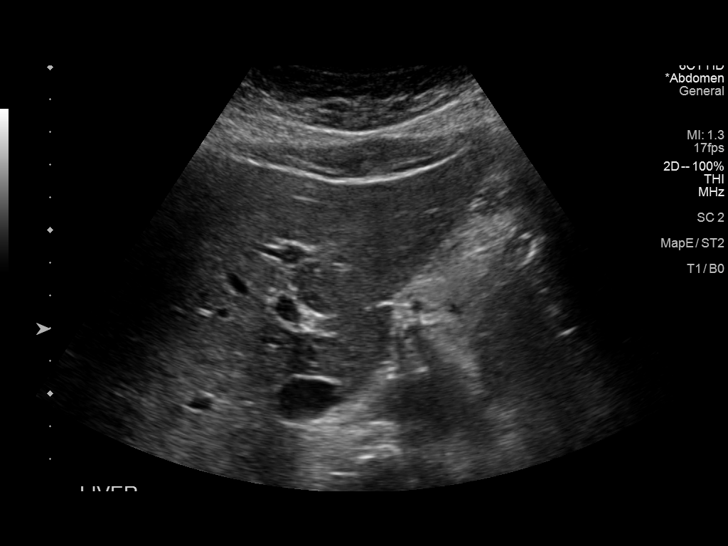

[14 of 25 positions shown; findings below may reference images not displayed]

FINDINGS: Gallbladder:

No gallstones or wall thickening visualized. No sonographic Murphy
sign noted by sonographer.

Common bile duct:

Diameter: 4 mm, within normal limits

Liver:

No focal lesion identified. Mild nonspecific heterogeneity of the
hepatic parenchymal echotexture. Portal vein is patent on color
Doppler imaging with normal direction of blood flow towards the
liver.
IMPRESSION: Mild nonspecific heterogeneity of the hepatic parenchymal
echotexture.

Otherwise unremarkable right upper quadrant ultrasound as described.
No gallstones are demonstrated.

## 2020-06-25 ENCOUNTER — Telehealth: Payer: Self-pay | Admitting: Family Medicine

## 2020-06-25 NOTE — Telephone Encounter (Signed)
Called patient in regards to [facility]. LVM to call back.

## 2020-06-27 NOTE — Telephone Encounter (Signed)
FMLA has been faxed.  Copy for scan Copy for patient

## 2020-06-28 ENCOUNTER — Encounter: Payer: Self-pay | Admitting: Family Medicine

## 2020-06-28 ENCOUNTER — Ambulatory Visit (INDEPENDENT_AMBULATORY_CARE_PROVIDER_SITE_OTHER): Payer: Managed Care, Other (non HMO) | Admitting: Family Medicine

## 2020-06-28 ENCOUNTER — Other Ambulatory Visit: Payer: Self-pay

## 2020-06-28 DIAGNOSIS — F321 Major depressive disorder, single episode, moderate: Secondary | ICD-10-CM | POA: Diagnosis not present

## 2020-06-28 NOTE — Patient Instructions (Addendum)
Start back counseling as planned.  Make time for self, work on stress reduction, increase exercise. Plan follow up on 07/09/20 for reassessment and work return  On 3/2/222

## 2020-06-28 NOTE — Assessment & Plan Note (Signed)
Restart counseling.   Work on exercise, activities, stress reduction and relaxation. Offered increase in prozac vs addition of wellbutrin.Marland Kitchen we will continue prozac 40 mg daily for now.  FMLA  Leave until 07/10/20 as she cannot function in her job tasks at work.. see HPI.  Reassess  On 07/09/2020

## 2020-06-28 NOTE — Progress Notes (Signed)
Patient ID: Angela [name], female    DOB: 1985-05-23, 35 y.o.   MRN: [id number]  This visit was conducted in person.  BP 114/76   Pulse 89   Temp 97.8 F (36.6 C) (Temporal)   Ht 5' 3.75" (1.619 m)   Wt 167 lb 8 oz (76 kg)   SpO2 98%   BMI 28.98 kg/m    CC:  Chief Complaint  Patient presents with  . Follow-up    Mood    Subjective:   HPI: Angela [name] is a 35 y.o. female  With history of MDD and GAD presenting on 06/28/2020 for Follow-up (Mood)  She reports her mood has worsened in the last several weeks. She has noted more depressive symptoms including , anhedonia, trouble sleeping,, decreased appetite,  decreased energy, poor concentration. Cloudy thinking.  No suicidal thinking. She is also feeling anxious nervous , on edge. Excessive worry. Spontaneous crying. No specific panic attacks.  Triggers: she has had family member in ICU with COVID. Son also tested positive.  Cousin tried to commit suicide.Increase stress   She had stopped seeing counselor... called her to reset up appointments... appt on 07/08/2020  She is on prozac 40 mg daily... no SE. She  Is not using trazodone for sleep at night as it made you groggy even 1/2 tab.  She is using atarax prn panic attacks.  Job functions: has 32vhaing trouble focusing.. 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 Troule managing tasks, trouble remebering tasks.      Relevant past medical, surgical, family and social history reviewed and updated as indicated. Interim medical history since our last visit reviewed. Allergies and medications reviewed and updated. Outpatient Medications Prior to Visit  Medication Sig Dispense Refill  . amLODipine (NORVASC) 10 MG tablet TAKE 1 TABLET BY MOUTH  DAILY 90 tablet 0  . cyclobenzaprine (FLEXERIL) 10 MG tablet TAKE 1 TABLET (10 MG TOTAL) BY MOUTH AT BEDTIME AS NEEDED FOR MUSCLE SPASMS. 15 tablet 0  . FLUoxetine (PROZAC) 40 MG capsule Take 1 capsule (40 mg total)  by mouth daily. 90 capsule 1  . Galcanezumab-gnlm (EMGALITY) 120 MG/ML SOAJ 1INJECT 120MG  INTO THE SKIN EVERY 30 DAYS 1 mL 5  . hydrOXYzine (ATARAX/VISTARIL) 10 MG tablet Take 1 tablet (10 mg total) by mouth 3 (three) times daily as needed for anxiety. 30 tablet 0  . losartan (COZAAR) 100 MG tablet TAKE 1 TABLET BY MOUTH  DAILY 90 tablet 3  . metoprolol succinate (TOPROL-XL) 25 MG 24 hr tablet Take 1 tablet (25 mg total) by mouth daily. 90 tablet 1  . pantoprazole (PROTONIX) 40 MG tablet Take 40 mg by mouth daily.    . potassium chloride SA (KLOR-CON) 20 MEQ tablet TAKE 1 TABLET BY MOUTH  EVERY SUNDAY, MONDAY,  WEDNESDAY, AND FRIDAY 48 tablet 1  . promethazine (PHENERGAN) 25 MG tablet Take 1 tablet (25 mg total) by mouth every 6 (six) hours as needed for nausea or vomiting. 30 tablet 2  . tolterodine (DETROL LA) 4 MG 24 hr capsule TAKE 1 CAPSULE BY MOUTH  DAILY 90 capsule 0  . traZODone (DESYREL) 50 MG tablet Take 0.5-1 tablets (25-50 mg total) by mouth at bedtime as needed for sleep. 30 tablet 3  . valACYclovir (VALTREX) 1000 MG tablet Take 1 tablet (1,000 mg total) by mouth 3 (three) times daily. 21 tablet 0   No facility-administered medications prior to visit.     Per HPI unless specifically indicated in ROS section  below Review of Systems  Constitutional: Negative for fatigue and fever.  HENT: Negative for ear pain.   Eyes: Negative for pain.  Respiratory: Negative for chest tightness and shortness of breath.   Cardiovascular: Negative for chest pain, palpitations and leg swelling.  Gastrointestinal: Negative for abdominal pain.  Genitourinary: Negative for dysuria.  Psychiatric/Behavioral: Positive for agitation, decreased concentration, dysphoric mood and sleep disturbance. Negative for self-injury and suicidal ideas. The patient is nervous/anxious.    Objective:  BP 114/76   Pulse 89   Temp 97.8 F (36.6 C) (Temporal)   Ht 5' 3.75" (1.619 m)   Wt 167 lb 8 oz (76 kg)   SpO2  98%   BMI 28.98 kg/m   Wt Readings from Last 3 Encounters:  06/28/20 167 lb 8 oz (76 kg)  02/13/20 165 lb 12 oz (75.2 kg)  12/04/19 163 lb 8 oz (74.2 kg)      Physical Exam Constitutional:      General: She is not in acute distress.Vital signs are normal.     Appearance: Normal appearance. She is well-developed and well-nourished. She is not ill-appearing or toxic-appearing.  HENT:     Head: Normocephalic.     Right Ear: Hearing, tympanic membrane, ear canal and external ear normal. Tympanic membrane is not erythematous, retracted or bulging.     Left Ear: Hearing, tympanic membrane, ear canal and external ear normal. Tympanic membrane is not erythematous, retracted or bulging.     Nose: No mucosal edema or rhinorrhea.     Right Sinus: No maxillary sinus tenderness or frontal sinus tenderness.     Left Sinus: No maxillary sinus tenderness or frontal sinus tenderness.     Mouth/Throat:     Mouth: Oropharynx is clear and moist and mucous membranes are normal.     Pharynx: Uvula midline.  Eyes:     General: Lids are normal. Lids are everted, no foreign bodies appreciated.     Extraocular Movements: EOM normal.     Conjunctiva/sclera: Conjunctivae normal.     Pupils: Pupils are equal, round, and reactive to light.  Neck:     Thyroid: No thyroid mass or thyromegaly.     Vascular: No carotid bruit.     Trachea: Trachea normal.  Cardiovascular:     Rate and Rhythm: Normal rate and regular rhythm.     Pulses: Normal pulses and intact distal pulses.     Heart sounds: Normal heart sounds, S1 normal and S2 normal. No murmur heard. No friction rub. No gallop.   Pulmonary:     Effort: Pulmonary effort is normal. No tachypnea or respiratory distress.     Breath sounds: Normal breath sounds. No decreased breath sounds, wheezing, rhonchi or rales.  Abdominal:     General: Bowel sounds are normal.     Palpations: Abdomen is soft.     Tenderness: There is no abdominal tenderness.   Musculoskeletal:     Cervical back: Normal range of motion and neck supple.  Skin:    General: Skin is warm, dry and intact.     Findings: No rash.  Neurological:     Mental Status: She is alert.  Psychiatric:        Attention and Perception: She is inattentive.        Mood and Affect: Mood is depressed. Mood is not anxious. Affect is flat.        Speech: Speech normal.        Behavior: Behavior normal. Behavior is cooperative.  Thought Content: Thought content normal.        Cognition and Memory: Cognition and memory normal. Cognition is impaired.        Judgment: Judgment normal.       Results for orders placed or performed in visit on 02/02/20  Hemoglobin A1c  Result Value Ref Range   Hgb A1c MFr Bld 5.5 4.8 - 5.6 %   Est. average glucose Bld gHb Est-mCnc 111 mg/dL  Lipid panel  Result Value Ref Range   Cholesterol, Total 152 100 - 199 mg/dL   Triglycerides 644 0 - 149 mg/dL   HDL 55 >03 mg/dL   VLDL Cholesterol Cal 21 5 - 40 mg/dL   LDL Chol Calc (NIH) 76 0 - 99 mg/dL   Chol/HDL Ratio 2.8 0.0 - 4.4 ratio  Comprehensive metabolic panel  Result Value Ref Range   Glucose 98 65 - 99 mg/dL   BUN 12 6 - 20 mg/dL   Creatinine, Ser 4.74 0.57 - 1.00 mg/dL   GFR calc non Af Amer 100 >59 mL/min/1.73   GFR calc Af Amer 116 >59 mL/min/1.73   BUN/Creatinine Ratio 16 9 - 23   Sodium 136 134 - 144 mmol/L   Potassium 4.0 3.5 - 5.2 mmol/L   Chloride 97 96 - 106 mmol/L   CO2 24 20 - 29 mmol/L   Calcium 9.3 8.7 - 10.2 mg/dL   Total Protein 7.5 6.0 - 8.5 g/dL   Albumin 4.7 3.8 - 4.8 g/dL   Globulin, Total 2.8 1.5 - 4.5 g/dL   Albumin/Globulin Ratio 1.7 1.2 - 2.2   Bilirubin Total 0.4 0.0 - 1.2 mg/dL   Alkaline Phosphatase 55 44 - 121 IU/L   AST 35 0 - 40 IU/L   ALT 37 (H) 0 - 32 IU/L  CBC with Differential/Platelet  Result Value Ref Range   WBC 7.5 3.4 - 10.8 x10E3/uL   RBC 4.03 3.77 - 5.28 x10E6/uL   Hemoglobin 12.4 11.1 - 15.9 g/dL   Hematocrit 25.9 56.3 - 46.6 %    MCV 94 79 - 97 fL   MCH 30.8 26.6 - 33.0 pg   MCHC 32.6 31.5 - 35.7 g/dL   RDW 87.5 64.3 - 32.9 %   Platelets 355 150 - 450 x10E3/uL   Neutrophils 49 Not Estab. %   Lymphs 36 Not Estab. %   Monocytes 11 Not Estab. %   Eos 2 Not Estab. %   Basos 1 Not Estab. %   Neutrophils Absolute 3.8 1.4 - 7.0 x10E3/uL   Lymphocytes Absolute 2.7 0.7 - 3.1 x10E3/uL   Monocytes Absolute 0.8 0.1 - 0.9 x10E3/uL   EOS (ABSOLUTE) 0.1 0.0 - 0.4 x10E3/uL   Basophils Absolute 0.0 0.0 - 0.2 x10E3/uL   Immature Granulocytes 1 Not Estab. %   Immature Grans (Abs) 0.1 0.0 - 0.1 x10E3/uL    This visit occurred during the SARS-CoV-2 public health emergency.  Safety protocols were in place, including screening questions prior to the visit, additional usage of staff PPE, and extensive cleaning of exam room while observing appropriate contact time as indicated for disinfecting solutions.   COVID 19 screen:  No recent travel or known exposure to COVID19 The patient denies respiratory symptoms of COVID 19 at this time. The importance of social distancing was discussed today.   Assessment and Plan    Problem List Items Addressed This Visit    Current moderate episode of major depressive disorder without prior episode (HCC)     Restart  counseling.   Work on exercise, activities, stress reduction and relaxation. Offered increase in prozac vs addition of wellbutrin.Marland Kitchen we will continue prozac 40 mg daily for now.  FMLA  Leave until 07/10/20 as she cannot function in her job tasks at work.. see HPI.  Reassess  On 07/09/2020           [name], MD

## 2020-07-01 NOTE — Telephone Encounter (Signed)
Received paperwork. Placed on PCP's inbox.

## 2020-07-02 DIAGNOSIS — Z0279 Encounter for issue of other medical certificate: Secondary | ICD-10-CM

## 2020-07-03 NOTE — Telephone Encounter (Signed)
Called patient and notified paperwork was faxed and copy ready up front for pick up. Expressed understanding.   Copy for scan Copy for billing Copy for patient

## 2020-07-08 ENCOUNTER — Ambulatory Visit (INDEPENDENT_AMBULATORY_CARE_PROVIDER_SITE_OTHER): Payer: 59 | Admitting: Psychology

## 2020-07-08 DIAGNOSIS — F411 Generalized anxiety disorder: Secondary | ICD-10-CM

## 2020-07-09 ENCOUNTER — Encounter: Payer: Self-pay | Admitting: Family Medicine

## 2020-07-09 ENCOUNTER — Telehealth (INDEPENDENT_AMBULATORY_CARE_PROVIDER_SITE_OTHER): Payer: Managed Care, Other (non HMO) | Admitting: Family Medicine

## 2020-07-09 ENCOUNTER — Other Ambulatory Visit: Payer: Self-pay

## 2020-07-09 VITALS — BP 115/73 | HR 87 | Temp 97.5°F | Ht 63.75 in | Wt 168.0 lb

## 2020-07-09 DIAGNOSIS — F411 Generalized anxiety disorder: Secondary | ICD-10-CM

## 2020-07-09 DIAGNOSIS — F321 Major depressive disorder, single episode, moderate: Secondary | ICD-10-CM | POA: Diagnosis not present

## 2020-07-09 NOTE — Assessment & Plan Note (Signed)
Improved control with counseling, prozac 40 mg daily.  Cleared to return to work.. encouraged her to look into options for lower stress environment.Marland Kitchen avoiding phone call "call centers"

## 2020-07-09 NOTE — Progress Notes (Signed)
VIRTUAL VISIT Due to national recommendations of social distancing due to COVID 19, a virtual visit is felt to be most appropriate for this patient at this time.   I connected with the patient on 07/09/20 at 12:00 PM EST by virtual telehealth platform and verified that I am speaking with the correct person using two identifiers.   I discussed the limitations, risks, security and privacy concerns of performing an evaluation and management service by  virtual telehealth platform and the availability of in person appointments. I also discussed with the patient that there may be a patient responsible charge related to this service. The patient expressed understanding and agreed to proceed.  Patient location: Home Provider Location: [facility] Participants: [name] and [name]   Chief Complaint  Patient presents with  . Follow-up    Mood    History of Present Illness:  MDD,GAD:  At last OV 06/28/2020.. counseling restarted, prozac 40 mg daily continued and pt placed on FMLA until 3/2/022. She has several appts with counselor.  Today she reports  Significant improvement in depression, slight worsening of anxiety. She is sleeping well.  She has felt able to relax more. Ttolerating medication well.  Improved concentration. Able to focus better. Less cloudy thinking.  Sleeping 7-8 hour night.. wakes up fairly rested.   Depression screen [facility] 2/9 07/09/2020 06/28/2020 02/13/2020  Decreased Interest 1 2 0  Down, Depressed, Hopeless 0 2 0  PHQ - 2 Score 1 4 0  Altered sleeping 1 2 1   Tired, decreased energy 1 2 1   Change in appetite 1 2 0  Feeling bad or failure about yourself  0 0 0  Trouble concentrating 1 2 1   Moving slowly or fidgety/restless 0 0 0  Suicidal thoughts 0 0 0  PHQ-9 Score 5 12 3   Difficult doing work/chores Somewhat difficult Very difficult Somewhat difficult  Some recent data might be hidden   GAD 7 : Generalized Anxiety Score 06/28/2020  06/13/2019 05/30/2019 05/16/2019  Nervous, Anxious, on Edge 2 1 1 1   Control/stop worrying 2 1 1 1   Worry too much - different things 2 1 1 1   Trouble relaxing 1 1 1 1   Restless 0 1 0 1  Easily annoyed or irritable 2 1 1 1   Afraid - awful might happen 2 1 1 1   Total GAD 7 Score 11 7 6 7   Anxiety Difficulty Very difficult Somewhat difficult Somewhat difficult Somewhat difficult     COVID 19 screen No recent travel or known exposure to COVID19 The patient denies respiratory symptoms of COVID 19 at this time.  The importance of social distancing was discussed today.   Review of Systems  Constitutional: Negative for chills and fever.  HENT: Negative for congestion and ear pain.   Eyes: Negative for pain and redness.  Respiratory: Negative for cough and shortness of breath.   Cardiovascular: Negative for chest pain, palpitations and leg swelling.  Gastrointestinal: Negative for abdominal pain, blood in stool, constipation, diarrhea, nausea and vomiting.  Genitourinary: Negative for dysuria.  Musculoskeletal: Negative for falls and myalgias.  Skin: Negative for rash.  Neurological: Negative for dizziness.  Psychiatric/Behavioral: Negative for depression. The patient is not nervous/anxious.       Past Medical History:  Diagnosis Date  . Abnormal Pap smear AGE 35   COLPO LAST PAP 08/2011  . Allergy   . Anemia   . Complication of anesthesia    NAUSEA; VOMITING; ITCHING; DIFFICULTY BREATHING  . Genital  herpes   . GERD (gastroesophageal reflux disease) AGE 23   tums prn  . History of chicken pox   . Hypertension   . Infection AGE 17   CHLAMYDIA; GONORRHEA  . 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 Infection    BV  . Migraines   . Ovarian cyst rupture   . PONV (postoperative nausea and vomiting)   . Preterm labor   . Urinary incontinence     reports that she has never smoked. She has never used smokeless tobacco. She reports current alcohol use of about 3.0 standard drinks of alcohol per  week. She reports that she does not use drugs.   Current Outpatient Medications:  .  amLODipine (NORVASC) 10 MG tablet, TAKE 1 TABLET BY MOUTH  DAILY, Disp: 90 tablet, Rfl: 0 .  cyclobenzaprine (FLEXERIL) 10 MG tablet, TAKE 1 TABLET (10 MG TOTAL) BY MOUTH AT BEDTIME AS NEEDED FOR MUSCLE SPASMS., Disp: 15 tablet, Rfl: 0 .  FLUoxetine (PROZAC) 40 MG capsule, Take 1 capsule (40 mg total) by mouth daily., Disp: 90 capsule, Rfl: 1 .  Galcanezumab-gnlm (EMGALITY) 120 MG/ML SOAJ, 1INJECT 120MG  INTO THE SKIN EVERY 30 DAYS, Disp: 1 mL, Rfl: 5 .  hydrOXYzine (ATARAX/VISTARIL) 10 MG tablet, Take 1 tablet (10 mg total) by mouth 3 (three) times daily as needed for anxiety., Disp: 30 tablet, Rfl: 0 .  losartan (COZAAR) 100 MG tablet, TAKE 1 TABLET BY MOUTH  DAILY, Disp: 90 tablet, Rfl: 3 .  metoprolol succinate (TOPROL-XL) 25 MG 24 hr tablet, Take 1 tablet (25 mg total) by mouth daily., Disp: 90 tablet, Rfl: 1 .  pantoprazole (PROTONIX) 40 MG tablet, Take 40 mg by mouth daily., Disp: , Rfl:  .  potassium chloride SA (KLOR-CON) 20 MEQ tablet, TAKE 1 TABLET BY MOUTH  EVERY SUNDAY, MONDAY,  WEDNESDAY, AND FRIDAY, Disp: 48 tablet, Rfl: 1 .  promethazine (PHENERGAN) 25 MG tablet, Take 1 tablet (25 mg total) by mouth every 6 (six) hours as needed for nausea or vomiting., Disp: 30 tablet, Rfl: 2 .  tolterodine (DETROL LA) 4 MG 24 hr capsule, TAKE 1 CAPSULE BY MOUTH  DAILY, Disp: 90 capsule, Rfl: 0 .  traZODone (DESYREL) 50 MG tablet, Take 0.5-1 tablets (25-50 mg total) by mouth at bedtime as needed for sleep., Disp: 30 tablet, Rfl: 3 .  valACYclovir (VALTREX) 1000 MG tablet, Take 1 tablet (1,000 mg total) by mouth 3 (three) times daily., Disp: 21 tablet, Rfl: 0   Observations/Objective: Blood pressure 115/73, pulse 87, temperature (!) 97.5 F (36.4 C), temperature source Temporal, height 5' 3.75" (1.619 m), weight 168 lb (76.2 kg).  Physical Exam  Physical Exam Constitutional:      General: The patient is not in  acute distress. Pulmonary:     Effort: Pulmonary effort is normal. No respiratory distress.  Neurological:     Mental Status: The patient is alert and oriented to person, place, and time.  Psychiatric:        Mood and Affect: Mood normal.        Behavior: Behavior normal.   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  Cleared to return to work.. encouraged her to look into options for lower stress environment. avoiding phone call "call centers"      GAD (generalized anxiety disorder)  Continue stress reduction and relaxation           I discussed the assessment and treatment plan with the patient. The patient was provided an opportunity to ask questions and all were answered. The patient agreed with the plan and demonstrated an understanding of the instructions.   The patient was advised to call back or seek an in-person evaluation if the symptoms worsen or if the condition fails to improve as anticipated.     [name], MD

## 2020-07-09 NOTE — Progress Notes (Signed)
Work note written as instructed by Dr. Bedsole and sent to patient's MyChart. 

## 2020-07-09 NOTE — Assessment & Plan Note (Signed)
Continue stress reduction and relaxation

## 2020-07-16 ENCOUNTER — Ambulatory Visit: Payer: 59 | Admitting: Psychology

## 2020-07-16 ENCOUNTER — Other Ambulatory Visit: Payer: Self-pay

## 2020-07-16 MED ORDER — AMLODIPINE BESYLATE 10 MG PO TABS
10.0000 mg | ORAL_TABLET | Freq: Every day | ORAL | 1 refills | Status: DC
Start: 2020-07-16 — End: 2020-10-01

## 2020-07-16 NOTE — Telephone Encounter (Signed)
Pharmacy requests refill on: Amlodipine 10 mg   LAST REFILL: 01/05/2020 (Q-90, R-0) LAST OV: 07/09/2020 NEXT OV: Not Scheduled  PHARMACY:[id number] [place], [place]

## 2020-08-08 ENCOUNTER — Telehealth: Payer: Self-pay | Admitting: Family Medicine

## 2020-08-08 MED ORDER — LOSARTAN POTASSIUM 100 MG PO TABS
100.0000 mg | ORAL_TABLET | Freq: Every day | ORAL | 1 refills | Status: DC
Start: 2020-08-08 — End: 2021-01-27

## 2020-08-08 NOTE — Telephone Encounter (Signed)
Refill sent as requested. 

## 2020-08-08 NOTE — Telephone Encounter (Signed)
  LAST APPOINTMENT DATE: 07/16/2020   NEXT APPOINTMENT DATE:@Visit  date not found  MEDICATION: losartan 100mg   [facility] [address]   Let patient know to contact pharmacy at the end of the day to make sure medication is ready.  Please notify patient to allow 48-72 hours to process  Encourage patient to contact the pharmacy for refills or they can request refills through [facility]  CLINICAL FILLS OUT ALL BELOW:   LAST REFILL:  QTY:  REFILL DATE:    OTHER COMMENTS:    Okay for refill?  Please advise

## 2020-09-01 ENCOUNTER — Other Ambulatory Visit: Payer: Self-pay

## 2020-09-02 MED ORDER — TOLTERODINE TARTRATE ER 4 MG PO CP24
4.0000 mg | ORAL_CAPSULE | Freq: Every day | ORAL | 1 refills | Status: DC
Start: 2020-09-02 — End: 2021-03-26

## 2020-09-05 ENCOUNTER — Other Ambulatory Visit: Payer: Self-pay | Admitting: Family Medicine

## 2020-09-11 ENCOUNTER — Other Ambulatory Visit: Payer: Self-pay | Admitting: Family Medicine

## 2020-09-13 ENCOUNTER — Telehealth: Payer: Self-pay | Admitting: Family Medicine

## 2020-09-13 NOTE — Telephone Encounter (Signed)
[employer] requesting office visit notes from 09/02/2020 to date.    I called [employer] to inform we have no office note/visits from that date to now.  Called pt to inform  Pt requesting STD paperwork be filled out

## 2020-09-13 NOTE — Telephone Encounter (Signed)
Called pt to inform paperwork completed and faxed  Copy mailed to pt  Copy for billing  Copy for scan  Copy retained by me

## 2020-09-13 NOTE — Telephone Encounter (Signed)
Semi completed STD paperwork placed in PCP's inbox for review, completion, sign and date

## 2020-09-13 NOTE — Telephone Encounter (Signed)
Received FMLA paperwork via fax. Given to Jodene Nam J to process. EM

## 2020-09-13 NOTE — Telephone Encounter (Signed)
Completed and in outbox. 

## 2020-09-27 NOTE — Telephone Encounter (Signed)
Faxed most recent office notes from 07/09/2020 to [facility] @ [phone]

## 2020-09-27 NOTE — Telephone Encounter (Signed)
Marylene Land clinician with [employer] left v/m needing last office note faxed to [phone]. They did receive the form sent on 09/13/20. Sending note to Tamera.

## 2020-10-01 MED ORDER — AMLODIPINE BESYLATE 10 MG PO TABS
10.0000 mg | ORAL_TABLET | Freq: Every day | ORAL | 1 refills | Status: DC
Start: 2020-10-01 — End: 2021-06-17

## 2020-10-04 ENCOUNTER — Encounter: Payer: Self-pay | Admitting: Family Medicine

## 2020-10-04 ENCOUNTER — Telehealth (INDEPENDENT_AMBULATORY_CARE_PROVIDER_SITE_OTHER): Payer: Managed Care, Other (non HMO) | Admitting: Family Medicine

## 2020-10-04 VITALS — Temp 97.7°F | Ht 63.75 in | Wt 165.0 lb

## 2020-10-04 DIAGNOSIS — F331 Major depressive disorder, recurrent, moderate: Secondary | ICD-10-CM

## 2020-10-04 DIAGNOSIS — F410 Panic disorder [episodic paroxysmal anxiety] without agoraphobia: Secondary | ICD-10-CM | POA: Diagnosis not present

## 2020-10-04 MED ORDER — HYDROXYZINE HCL 10 MG PO TABS
10.0000 mg | ORAL_TABLET | Freq: Three times a day (TID) | ORAL | 0 refills | Status: DC | PRN
Start: 2020-10-04 — End: 2022-08-21

## 2020-10-04 NOTE — Progress Notes (Signed)
Patient ID: Angela [name], female    DOB: April 13, 1986, 35 y.o.   MRN: [id number]  This visit was conducted in person.  Temp 97.7 F (36.5 C) (Temporal)   Ht 5' 3.75" (1.619 m)   Wt 165 lb (74.8 kg)   BMI 28.54 kg/m    CC:  Chief Complaint  Patient presents with  . Follow-up    For [employer] Disability-They are requiring office note for the month of May    Subjective:   HPI: Angela [name] is a 35 y.o. female presenting on 10/04/2020 for Follow-up (For Short [employer] Disability-They are requiring office note for the month of May)  She has history of MDD, GAD with panic attacks recurrently in past and is currently out of work on medical leave given current and ongoing symptom  At last OV 07/09/2020 she was restarted on counseling and prozac was continued at 40 mg daily. First available appt was 10/08/2020 so has not seen them yet.   She tried to go back to work, all of her anxiety and depression symptoms returned. 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 She has crying spell in between calls or when client on hold.  Working from home has not decreased the stress.  Panic attacks are occurring  a couple times a month. Using hydroxyzine prn.   Her questionnaires are showing worsening symptoms for depression and stable inadequate control on anxiety.   FMLA 5/2-6/30   Depression screen [facility] 2/9 10/04/2020 07/09/2020 06/28/2020 02/13/2020 06/13/2019  Decreased Interest 2 1 2  0 1  Down, Depressed, Hopeless 1 0 2 0 1  PHQ - 2 Score 3 1 4  0 2  Altered sleeping 1 1 2 1 1   Tired, decreased energy 1 1 2 1 1   Change in appetite 1 1 2  0 1  Feeling bad or failure about yourself  1 0 0 0 0  Trouble concentrating 2 1 2 1 1   Moving slowly or fidgety/restless 1 0 0 0 0  Suicidal thoughts 0 0 0 0 0  PHQ-9 Score 10 5 12 3 6   Difficult doing work/chores Very difficult Somewhat difficult Very  difficult Somewhat difficult Somewhat difficult  Some recent data might be hidden   GAD 7 : Generalized Anxiety Score 10/04/2020 06/28/2020 06/13/2019 05/30/2019  Nervous, Anxious, on Edge 2 2 1 1   Control/stop worrying 2 2 1 1   Worry too much - different things 2 2 1 1   Trouble relaxing 1 1 1 1   Restless 2 0 1 0  Easily annoyed or irritable 1 2 1 1   Afraid - awful might happen 1 2 1 1   Total GAD 7 Score 11 11 7 6   Anxiety Difficulty Very difficult Very difficult Somewhat difficult Somewhat difficult   Using trazodone for sleep        Relevant past medical, surgical, family and social history reviewed and updated as indicated. Interim medical history since our last visit reviewed. Allergies and medications reviewed and updated. Outpatient Medications Prior to Visit  Medication Sig Dispense Refill  . amLODipine (NORVASC) 10 MG tablet Take 1 tablet (10 mg total) by mouth daily. 90 tablet 1  . cyclobenzaprine (FLEXERIL) 10 MG tablet TAKE 1 TABLET (10 MG TOTAL) BY MOUTH AT BEDTIME AS NEEDED FOR MUSCLE SPASMS. 15 tablet 0  . FLUoxetine (PROZAC) 40 MG capsule Take 1 capsule (40 mg total) by mouth  daily. 90 capsule 1  . Galcanezumab-gnlm (EMGALITY) 120 MG/ML SOAJ 1INJECT 120MG  INTO THE SKIN EVERY 30 DAYS 1 mL 5  . hydrOXYzine (ATARAX/VISTARIL) 10 MG tablet Take 1 tablet (10 mg total) by mouth 3 (three) times daily as needed for anxiety. 30 tablet 0  . losartan (COZAAR) 100 MG tablet Take 1 tablet (100 mg total) by mouth daily. 90 tablet 1  . metoprolol succinate (TOPROL-XL) 25 MG 24 hr tablet TAKE 1 TABLET DAILY 90 tablet 1  . pantoprazole (PROTONIX) 40 MG tablet Take 40 mg by mouth daily.    . potassium chloride SA (KLOR-CON M20) 20 MEQ tablet TAKE 1 TABLET EVERY SUNDAY, MONDAY, WEDNESDAY AND FRIDAY 48 tablet 1  . promethazine (PHENERGAN) 25 MG tablet Take 1 tablet (25 mg total) by mouth every 6 (six) hours as needed for nausea or vomiting. 30 tablet 2  . tolterodine (DETROL LA) 4 MG 24 hr  capsule Take 1 capsule (4 mg total) by mouth daily. 90 capsule 1  . traZODone (DESYREL) 50 MG tablet Take 0.5-1 tablets (25-50 mg total) by mouth at bedtime as needed for sleep. 30 tablet 3  . valACYclovir (VALTREX) 1000 MG tablet Take 1 tablet (1,000 mg total) by mouth 3 (three) times daily. 21 tablet 0   No facility-administered medications prior to visit.      Per HPI unless specifically indicated in ROS section below Review of Systems  Constitutional: Negative for fatigue and fever.  HENT: Negative for congestion.   Eyes: Negative for pain.  Respiratory: Negative for cough and shortness of breath.   Cardiovascular: Negative for chest pain, palpitations and leg swelling.  Gastrointestinal: Negative for abdominal pain.  Genitourinary: Negative for dysuria and vaginal bleeding.  Musculoskeletal: Negative for back pain.  Neurological: Negative for syncope, light-headedness and headaches.  Psychiatric/Behavioral: Positive for dysphoric mood and sleep disturbance. Negative for self-injury and suicidal ideas. The patient is nervous/anxious.    Objective:  Temp 97.7 F (36.5 C) (Temporal)   Ht 5' 3.75" (1.619 m)   Wt 165 lb (74.8 kg)   BMI 28.54 kg/m   Wt Readings from Last 3 Encounters:  10/04/20 165 lb (74.8 kg)  07/09/20 168 lb (76.2 kg)  06/28/20 167 lb 8 oz (76 kg)      Physical Exam    Results for orders placed or performed in visit on 02/02/20  Hemoglobin A1c  Result Value Ref Range   Hgb A1c MFr Bld 5.5 4.8 - 5.6 %   Est. average glucose Bld gHb Est-mCnc 111 mg/dL  Lipid panel  Result Value Ref Range   Cholesterol, Total 152 100 - 199 mg/dL   Triglycerides 02/04/20 0 - 149 mg/dL   HDL 55 660 mg/dL   VLDL Cholesterol Cal 21 5 - 40 mg/dL   LDL Chol Calc (NIH) 76 0 - 99 mg/dL   Chol/HDL Ratio 2.8 0.0 - 4.4 ratio  Comprehensive metabolic panel  Result Value Ref Range   Glucose 98 65 - 99 mg/dL   BUN 12 6 - 20 mg/dL   Creatinine, Ser >63 0.57 - 1.00 mg/dL   GFR calc  non Af Amer 100 >59 mL/min/1.73   GFR calc Af Amer 116 >59 mL/min/1.73   BUN/Creatinine Ratio 16 9 - 23   Sodium 136 134 - 144 mmol/L   Potassium 4.0 3.5 - 5.2 mmol/L   Chloride 97 96 - 106 mmol/L   CO2 24 20 - 29 mmol/L   Calcium 9.3 8.7 - 10.2 mg/dL  Total Protein 7.5 6.0 - 8.5 g/dL   Albumin 4.7 3.8 - 4.8 g/dL   Globulin, Total 2.8 1.5 - 4.5 g/dL   Albumin/Globulin Ratio 1.7 1.2 - 2.2   Bilirubin Total 0.4 0.0 - 1.2 mg/dL   Alkaline Phosphatase 55 44 - 121 IU/L   AST 35 0 - 40 IU/L   ALT 37 (H) 0 - 32 IU/L  CBC with Differential/Platelet  Result Value Ref Range   WBC 7.5 3.4 - 10.8 x10E3/uL   RBC 4.03 3.77 - 5.28 x10E6/uL   Hemoglobin 12.4 11.1 - 15.9 g/dL   Hematocrit 72.5 36.6 - 46.6 %   MCV 94 79 - 97 fL   MCH 30.8 26.6 - 33.0 pg   MCHC 32.6 31.5 - 35.7 g/dL   RDW 44.0 34.7 - 42.5 %   Platelets 355 150 - 450 x10E3/uL   Neutrophils 49 Not Estab. %   Lymphs 36 Not Estab. %   Monocytes 11 Not Estab. %   Eos 2 Not Estab. %   Basos 1 Not Estab. %   Neutrophils Absolute 3.8 1.4 - 7.0 x10E3/uL   Lymphocytes Absolute 2.7 0.7 - 3.1 x10E3/uL   Monocytes Absolute 0.8 0.1 - 0.9 x10E3/uL   EOS (ABSOLUTE) 0.1 0.0 - 0.4 x10E3/uL   Basophils Absolute 0.0 0.0 - 0.2 x10E3/uL   Immature Granulocytes 1 Not Estab. %   Immature Grans (Abs) 0.1 0.0 - 0.1 x10E3/uL    This visit occurred during the SARS-CoV-2 public health emergency.  Safety protocols were in place, including screening questions prior to the visit, additional usage of staff PPE, and extensive cleaning of exam room while observing appropriate contact time as indicated for disinfecting solution   COVID 19 screen:  No recent travel or known exposure to COVID19 The patient denies respiratory symptoms of COVID 19 at this time. The importance of social distancing was discussed today.   Assessment and Plan    Problem List Items Addressed This Visit    MDD (major depressive disorder), recurrent episode, moderate (HCC) -  Primary     Chronic, recurrent flare: Inadequate control. Unable to perform job functions as noted in HPI. FMLA from 09/09/2020 to 11/07/2020.   No available counseling until 10/08/2020... has appt to start then  Discussed medication change and increase... will continue prozac 40 mg daily for now.  Use atarax prn panic attacks and trazodone 50 mg at night for sleep.  Encouraged physical activity, light therapy.   Follow up in 4 weeks.        Relevant Medications   hydrOXYzine (ATARAX/VISTARIL) 10 MG tablet   Panic attacks   Relevant Medications   hydrOXYzine (ATARAX/VISTARIL) 10 MG tablet       [name], MD

## 2020-10-04 NOTE — Assessment & Plan Note (Signed)
Chronic, recurrent flare: Inadequate control. Unable to perform job functions as noted in HPI. FMLA from 09/09/2020 to 11/07/2020.   No available counseling until 10/08/2020... has appt to start then  Discussed medication change and increase... will continue prozac 40 mg daily for now.  Use atarax prn panic attacks and trazodone 50 mg at night for sleep.  Encouraged physical activity, light therapy.   Follow up in 4 weeks.

## 2020-10-04 NOTE — Progress Notes (Signed)
Office note faxed to [facility] as instructed by Dr. Ermalene Searing.

## 2020-10-08 ENCOUNTER — Ambulatory Visit (INDEPENDENT_AMBULATORY_CARE_PROVIDER_SITE_OTHER): Payer: 59 | Admitting: Psychology

## 2020-10-08 DIAGNOSIS — F4321 Adjustment disorder with depressed mood: Secondary | ICD-10-CM | POA: Diagnosis not present

## 2020-10-08 DIAGNOSIS — F411 Generalized anxiety disorder: Secondary | ICD-10-CM

## 2020-10-12 ENCOUNTER — Other Ambulatory Visit: Payer: Self-pay | Admitting: Neurology

## 2020-10-12 DIAGNOSIS — R112 Nausea with vomiting, unspecified: Secondary | ICD-10-CM

## 2020-10-17 MED ORDER — PANTOPRAZOLE SODIUM 40 MG PO TBEC: 40.0000 mg | DELAYED_RELEASE_TABLET | Freq: Every day | ORAL | 1 refills | Status: DC

## 2020-10-21 ENCOUNTER — Ambulatory Visit (INDEPENDENT_AMBULATORY_CARE_PROVIDER_SITE_OTHER): Payer: 59 | Admitting: Psychology

## 2020-10-21 DIAGNOSIS — F4321 Adjustment disorder with depressed mood: Secondary | ICD-10-CM

## 2020-10-21 DIAGNOSIS — F411 Generalized anxiety disorder: Secondary | ICD-10-CM | POA: Diagnosis not present

## 2020-10-21 MED ORDER — PANTOPRAZOLE SODIUM 40 MG PO TBEC: 40.0000 mg | DELAYED_RELEASE_TABLET | Freq: Every day | ORAL | 1 refills | Status: DC

## 2020-11-04 ENCOUNTER — Ambulatory Visit (INDEPENDENT_AMBULATORY_CARE_PROVIDER_SITE_OTHER): Payer: 59 | Admitting: Psychology

## 2020-11-04 DIAGNOSIS — F411 Generalized anxiety disorder: Secondary | ICD-10-CM

## 2020-11-04 DIAGNOSIS — F4321 Adjustment disorder with depressed mood: Secondary | ICD-10-CM | POA: Diagnosis not present

## 2020-11-05 ENCOUNTER — Telehealth (INDEPENDENT_AMBULATORY_CARE_PROVIDER_SITE_OTHER): Payer: Managed Care, Other (non HMO) | Admitting: Family Medicine

## 2020-11-05 ENCOUNTER — Encounter: Payer: Self-pay | Admitting: Family Medicine

## 2020-11-05 VITALS — BP 115/74 | HR 98 | Temp 97.3°F | Ht 63.75 in | Wt 165.0 lb

## 2020-11-05 DIAGNOSIS — F331 Major depressive disorder, recurrent, moderate: Secondary | ICD-10-CM | POA: Diagnosis not present

## 2020-11-05 DIAGNOSIS — F99 Mental disorder, not otherwise specified: Secondary | ICD-10-CM

## 2020-11-05 DIAGNOSIS — F5105 Insomnia due to other mental disorder: Secondary | ICD-10-CM | POA: Diagnosis not present

## 2020-11-05 DIAGNOSIS — F411 Generalized anxiety disorder: Secondary | ICD-10-CM

## 2020-11-05 MED ORDER — TRAZODONE HCL 50 MG PO TABS: 25.0000 mg | ORAL_TABLET | Freq: Every evening | ORAL | 3 refills | Status: DC | PRN

## 2020-11-05 NOTE — Assessment & Plan Note (Signed)
Extreme anxiety associated with return to work and call volume.  She will not be able to be successful unless client volume decreases and number of breaks increases.  She need time to do deep breathing and relaxation techniques between calls.  Will change FMLA to reflect this.

## 2020-11-05 NOTE — Assessment & Plan Note (Signed)
Improving control with prozac 40 mg daily and q 2 week counseling.

## 2020-11-05 NOTE — Assessment & Plan Note (Signed)
Poor control off trazodone. Restart and extend FMLA leave until sleep improved.. plan return July 5th.

## 2020-11-05 NOTE — Progress Notes (Signed)
VIRTUAL VISIT Due to national recommendations of social distancing due to COVID 19, a virtual visit is felt to be most appropriate for this patient at this time.   I connected with the patient on 11/05/20 at 12:00 PM EDT by virtual telehealth platform and verified that I am speaking with the correct person using two identifiers.   I discussed the limitations, risks, security and privacy concerns of performing an evaluation and management service by  virtual telehealth platform and the availability of in person appointments. I also discussed with the patient that there may be a patient responsible charge related to this service. The patient expressed understanding and agreed to proceed.  Patient location: Home Provider Location: [facility] Participants: [name] and [name]   Chief Complaint  Patient presents with   Follow-up    MDD/GAD    History of Present Illness:  35 year old female presents for follow up GAD and MDD. At last OV 1 month ago...continued prozac 40 mg daily,  HAS NOT BEEN USING trazodone 50 mg at night for insomnia... she thinks she needs refill, may have expired but she does not have any on hand.  Hydroxyzine prn panic attacks.   She report she is having trouble staying asleep at night.. having nightmare of going back to work. She is very anxious about going back to work.  Crying spells out the blue.  No recent panic attacks in last month.  Still seeing counselor every 2 weeks... working through ways to handle stress. Frequent breaks, and lower client numbers for  first month after getting back would be necessary to handle associates stress.  Deep breathing.   Return to work  extended July 5th  Depression screen [facility] 2/9 11/05/2020 10/04/2020 07/09/2020  Decreased Interest 1 2 1   Down, Depressed, Hopeless 1 1 0  PHQ - 2 Score 2 3 1   Altered sleeping 2 1 1   Tired, decreased energy 1 1 1   Change in appetite 1 1 1   Feeling bad or failure  about yourself  0 1 0  Trouble concentrating 1 2 1   Moving slowly or fidgety/restless 0 1 0  Suicidal thoughts 0 0 0  PHQ-9 Score 7 10 5   Difficult doing work/chores Very difficult Very difficult Somewhat difficult  Some recent data might be hidden    GAD 7 : Generalized Anxiety Score 11/05/2020 10/04/2020 06/28/2020 06/13/2019  Nervous, Anxious, on Edge 1 2 2 1   Control/stop worrying 1 2 2 1   Worry too much - different things 1 2 2 1   Trouble relaxing 1 1 1 1   Restless 0 2 0 1  Easily annoyed or irritable 2 1 2 1   Afraid - awful might happen 1 1 2 1   Total GAD 7 Score 7 11 11 7   Anxiety Difficulty Somewhat difficult Very difficult Very difficult Somewhat difficult    COVID 19 screen No recent travel or known exposure to COVID19 The patient denies respiratory symptoms of COVID 19 at this time.  The importance of social distancing was discussed today.   Review of Systems  Constitutional:  Negative for chills and fever.  HENT:  Negative for congestion and ear pain.   Eyes:  Negative for pain and redness.  Respiratory:  Negative for cough and shortness of breath.   Cardiovascular:  Negative for chest pain, palpitations and leg swelling.  Gastrointestinal:  Negative for abdominal pain, blood in stool, constipation, diarrhea, nausea and vomiting.  Genitourinary:  Negative for dysuria.  Musculoskeletal:  Negative for falls and myalgias.  Skin:  Negative for rash.  Neurological:  Negative for dizziness.  Psychiatric/Behavioral:  Negative for depression. The patient is not nervous/anxious.      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 She has never used smokeless tobacco. She reports current alcohol use of about 3.0 standard drinks of alcohol per week. She reports that she does not use drugs.   Current Outpatient Medications:    amLODipine (NORVASC) 10 MG tablet, Take 1 tablet (10 mg total) by mouth daily., Disp: 90 tablet, Rfl: 1   cyclobenzaprine (FLEXERIL) 10 MG tablet, TAKE 1 TABLET (10 MG TOTAL) BY MOUTH AT BEDTIME AS NEEDED FOR MUSCLE SPASMS., Disp: 15 tablet, Rfl: 0   FLUoxetine (PROZAC) 40 MG capsule, Take 1 capsule (40 mg total) by mouth daily., Disp: 90 capsule, Rfl: 1   Galcanezumab-gnlm (EMGALITY) 120 MG/ML SOAJ, 1INJECT 120MG  INTO THE SKIN EVERY 30 DAYS, Disp: 1 mL, Rfl: 5   hydrOXYzine (ATARAX/VISTARIL) 10 MG tablet, Take 1 tablet (10 mg total) by mouth 3 (three) times daily as needed for anxiety., Disp: 30 tablet, Rfl: 0   losartan (COZAAR) 100 MG tablet, Take 1 tablet (100 mg total) by mouth daily., Disp: 90 tablet, Rfl: 1   metoprolol succinate (TOPROL-XL) 25 MG 24 hr tablet, TAKE 1 TABLET DAILY, Disp: 90 tablet, Rfl: 1   pantoprazole (PROTONIX) 40 MG tablet, Take 1 tablet (40 mg total) by mouth daily., Disp: 90 tablet, Rfl: 1   potassium chloride SA (KLOR-CON M20) 20 MEQ tablet, TAKE 1 TABLET EVERY SUNDAY, MONDAY, WEDNESDAY AND FRIDAY, Disp: 48 tablet, Rfl: 1   promethazine (PHENERGAN) 25 MG tablet, TAKE 1 TABLET (25 MG TOTAL) BY MOUTH EVERY 6 (SIX) HOURS AS NEEDED FOR NAUSEA OR VOMITING., Disp: 30 tablet, Rfl: 0   tolterodine (DETROL LA) 4 MG 24 hr capsule, Take 1 capsule (4 mg total) by mouth daily., Disp: 90 capsule, Rfl: 1   traZODone (DESYREL) 50 MG tablet, Take 0.5-1 tablets (25-50 mg total) by mouth at bedtime as needed for sleep., Disp: 30 tablet, Rfl: 3   valACYclovir (VALTREX) 1000 MG tablet, Take 1 tablet (1,000 mg total) by mouth 3  (three) times daily., Disp: 21 tablet, Rfl: 0   Observations/Objective: Blood pressure 115/74, pulse 98, temperature (!) 97.3 F (36.3 C), temperature source Temporal, height 5' 3.75" (1.619 m), weight 165 lb (74.8 kg).  Physical Exam Constitutional:      General: The patient is not in acute distress. Pulmonary:     Effort: Pulmonary effort is normal. No respiratory distress.  Neurological:     Mental Status: The patient is alert and oriented to person, place, and time.  Psychiatric:        Mood and Affect: Mood normal.  Behavior: Behavior normal.   Assessment and Plan Problem List Items Addressed This Visit     GAD (generalized anxiety disorder)    Extreme anxiety associated with return to work and call volume.  She will not be able to be successful unless client volume decreases and number of breaks increases.  She need time to do deep breathing and relaxation techniques between calls.  Will change FMLA to reflect this.       Relevant Medications   traZODone (DESYREL) 50 MG tablet   Insomnia due to other mental disorder    Poor control off trazodone. Restart and extend FMLA leave until sleep improved.. plan return July 5th.       MDD (major depressive disorder), recurrent episode, moderate (HCC) - Primary    Improving control with prozac 40 mg daily and q 2 week counseling.       Relevant Medications   traZODone (DESYREL) 50 MG tablet      I discussed the assessment and treatment plan with the patient. The patient was provided an opportunity to ask questions and all were answered. The patient agreed with the plan and demonstrated an understanding of the instructions.   The patient was advised to call back or seek an in-person evaluation if the symptoms worsen or if the condition fails to improve as anticipated.     [name], MD

## 2020-11-06 NOTE — Telephone Encounter (Signed)
Office visit notes from 11/05/2020 faxed to [phone]  Copy mailed to pt  Copy retained by me

## 2020-11-18 ENCOUNTER — Ambulatory Visit (INDEPENDENT_AMBULATORY_CARE_PROVIDER_SITE_OTHER): Payer: 59 | Admitting: Psychology

## 2020-11-18 DIAGNOSIS — F411 Generalized anxiety disorder: Secondary | ICD-10-CM | POA: Diagnosis not present

## 2020-11-18 DIAGNOSIS — F4321 Adjustment disorder with depressed mood: Secondary | ICD-10-CM | POA: Diagnosis not present

## 2020-11-28 ENCOUNTER — Other Ambulatory Visit: Payer: Self-pay | Admitting: Family Medicine

## 2020-11-29 MED ORDER — METOPROLOL SUCCINATE ER 25 MG PO TB24: 25.0000 mg | ORAL_TABLET | Freq: Every day | ORAL | 0 refills | Status: DC

## 2020-12-01 MED ORDER — METOPROLOL SUCCINATE ER 25 MG PO TB24: 25.0000 mg | ORAL_TABLET | Freq: Every day | ORAL | 0 refills | Status: DC

## 2020-12-01 NOTE — Addendum Note (Signed)
Addended by: Damita Lack on: 12/01/2020 03:52 PM   Modules accepted: Orders

## 2020-12-02 ENCOUNTER — Ambulatory Visit: Payer: 59 | Admitting: Psychology

## 2020-12-02 NOTE — Patient Instructions (Addendum)
Below is our plan:  We will continue Emgality every 30 days. Continue complementary therapy to treat migraines as needed. Please let me know if abortive therapy if not working and we can try [place] or Nurtec.   Please make sure you are staying well hydrated. I recommend 50-60 ounces daily. Well balanced diet and regular exercise encouraged. Consistent sleep schedule with 6-8 hours recommended.   Please continue follow up with care team as directed.   Follow up with me in 1 year   You may receive a survey regarding today's visit. I encourage you to leave honest feed back as I do use this information to improve patient care. Thank you for seeing me today!

## 2020-12-02 NOTE — Progress Notes (Addendum)
PATIENT: Angela [name] DOB: 07/09/1985  REASON FOR VISIT: follow up HISTORY FROM: patient  Chief Complaint  Patient presents with   Follow-up    New rm, alone. Here for migraine f/u, pt reports being stable, has noticed HA are triggered from work stress and mainly happen last week before next dose is due.       HISTORY OF PRESENT ILLNESS: 12/03/20 ALL:  Angela [name] returns for follow up for migraines. She continues Emgality and tolerating well. She does have more headaches just prior to next injection. She has a headache band she uses in combination with resting in a dark room for abortive therapy that works well. She knows stress is most common trigger for her. She is feeling well, today.   12/04/2019 ALL:  Angela [name] is a 35 y.o. female here today for follow up for migraines. She continues Emgality monthly. She feels that she is doing well. She does note increase in headache frequency the week prior to next injection. She is able to use complementary therapies to abort headache. She is unable to tolerate triptan medications. She rarely uses phenergan.   HISTORY: (copied from my note on 09/27/2018)  Angela [name] is a 35 y.o. female for follow up of migraines. She is doing well on Emgality. She reports that she has about 2-3 migraines per month. She feels that migraines return the [facility] She does not use abortive therapy. She prefers compression and relaxation. She does have sound and light sensitivity with migraines and also has nausea and vomiting when they are severe.      History (copied from Dr Trevor MaceAhern's note on 09/24/2017)   Her headaches are much improved.  2-3 migraines a month baseline was > 15. She is thrilled with Ajovy. No side effects. Will continue. Discussed other options and the other cgrp meds, she is doing so well wont change a thing. Discussed acute management as well.    Interval history: Discussion today with patient  regarding Botox versus medications versus the new CGRP medications.  Patient would like to try the new CGRP medications. Provided sample today of Ajovy.   HPI:  Angela [name] is a 35 y.o. female here as a referral from Dr. Ermalene SearingBedsole for migraines. Past medical history of migraines, hypertension, anemia. Sister with migraines. Headaches worsening and started becoming very intense with nausea, vomiting, dizziness, blurry vision. Started in April with 2 weeks of hedaches. No inciting events or head trauma. There was stress at work however. Starts in the temples and behind the eys, can be unilateral and also in the back, pounding she can feel her veins and eyes about to pop out. Light and sound bothers her. She has nausea and vomiting. She has ear pain with the headaches but ear exam normal. She sees floaters sometime but no aura. She has 25 headache days a month, 15 are migrainous and can last up to 24 hours. She sometimes has morning headaches. No other focal neurologic deficits, associated symptoms, inciting events or modifiable factors. She was prescribed topiramate but never picked it up bc she read the side effects. She hs her fallopian tubes removed, no more children. No other focal neurologic deficits, associated symptoms, inciting events or modifiable factors.   Meds tried: Imitrex (made her feel weird), flexeril, Labetalol and Topiramate    Reviewed notes, labs and imaging from outside physicians, which showed:    hgba1c 5.6, CMP nml 09/2016   Reviewed notes, patient has  been to primary care multiple times and a migraines. Treated with Zofran. She reported using ibuprofen and Imitrex. Acute headaches resolved with Imitrex. She reported headaches every other day in April of this year. Feels a band around her head that is squeezing, associated with nausea, blurred vision and dizziness, photo and phonophobia associated, also noted ear pressure. Triggers include increase in stress. Exam noted that  there was no papilledema. She was started on Topamax at bedtime. She did not fill this given fear of side effects. She is using Imitrex for severe headaches and using ibuprofen as well as. The month with headaches usually 1-2 hours but some up to 6-12 hours.   REVIEW OF SYSTEMS: Out of a complete 14 system review of symptoms, the patient complains only of the following symptoms, headaches and all other reviewed systems are negative.  ALLERGIES: Allergies  Allergen Reactions   Amoxicillin Hives    Has patient had a PCN reaction causing immediate rash, facial/tongue/throat swelling, SOB or lightheadedness with hypotension: No Has patient had a PCN reaction causing severe rash involving mucus membranes or skin necrosis: Yes Has patient had a PCN reaction that required hospitalization No Has patient had a PCN reaction occurring within the last 10 years: No If all of the above answers are "NO", then may proceed with Cephalosporin use.    Dexilant [Dexlansoprazole] Nausea Only and Other (See Comments)    dizziness    HOME MEDICATIONS: Outpatient Medications Prior to Visit  Medication Sig Dispense Refill   amLODipine (NORVASC) 10 MG tablet Take 1 tablet (10 mg total) by mouth daily. 90 tablet 1   cyclobenzaprine (FLEXERIL) 10 MG tablet TAKE 1 TABLET (10 MG TOTAL) BY MOUTH AT BEDTIME AS NEEDED FOR MUSCLE SPASMS. 15 tablet 0   FLUoxetine (PROZAC) 40 MG capsule Take 1 capsule (40 mg total) by mouth daily. 90 capsule 1   hydrOXYzine (ATARAX/VISTARIL) 10 MG tablet Take 1 tablet (10 mg total) by mouth 3 (three) times daily as needed for anxiety. 30 tablet 0   losartan (COZAAR) 100 MG tablet Take 1 tablet (100 mg total) by mouth daily. 90 tablet 1   metoprolol succinate (TOPROL-XL) 25 MG 24 hr tablet Take 1 tablet (25 mg total) by mouth daily. 30 tablet 0   pantoprazole (PROTONIX) 40 MG tablet Take 1 tablet (40 mg total) by mouth daily. 90 tablet 1   potassium chloride SA (KLOR-CON M20) 20 MEQ tablet  TAKE 1 TABLET EVERY SUNDAY, MONDAY, WEDNESDAY AND FRIDAY 48 tablet 1   promethazine (PHENERGAN) 25 MG tablet TAKE 1 TABLET (25 MG TOTAL) BY MOUTH EVERY 6 (SIX) HOURS AS NEEDED FOR NAUSEA OR VOMITING. 30 tablet 0   tolterodine (DETROL LA) 4 MG 24 hr capsule Take 1 capsule (4 mg total) by mouth daily. 90 capsule 1   traZODone (DESYREL) 50 MG tablet TAKE 0.5-1 TABLETS BY MOUTH AT BEDTIME AS NEEDED FOR SLEEP. 90 tablet 0   valACYclovir (VALTREX) 1000 MG tablet Take 1 tablet (1,000 mg total) by mouth 3 (three) times daily. 21 tablet 0   Galcanezumab-gnlm (EMGALITY) 120 MG/ML SOAJ 1INJECT 120MG  INTO THE SKIN EVERY 30 DAYS 1 mL 5   No facility-administered medications prior to visit.    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   Infection    BV   Migraines    Ovarian cyst rupture    PONV (postoperative nausea and vomiting)    Preterm labor    Urinary incontinence     PAST SURGICAL HISTORY: Past Surgical History:  Procedure Laterality Date   CERVIX LESION DESTRUCTION     CESAREAN SECTION  2005, 2010   x2   CESAREAN SECTION WITH BILATERAL TUBAL LIGATION Bilateral 11/15/2012   Procedure: REPEAT CESAREAN SECTION WITH BILATERAL TUBAL LIGATION;  Surgeon: Kirkland Hun, MD;  Location: [facility];  Service: Obstetrics;  Laterality: Bilateral;   COLPOSCOPY     UNILATERAL SALPINGECTOMY Right 11/15/2012   Procedure: UNILATERAL SALPINGECTOMY;  Surgeon: Kirkland Hun, MD;  Location: [facility];  Service: Obstetrics;  Laterality: Right;   WISDOM TOOTH EXTRACTION      FAMILY HISTORY: Family History  Problem Relation Age of Onset   Anesthesia  problems Mother        NAUSEA AND VOMITING   Thyroid disease Mother    Fibromyalgia Mother    Sarcoidosis Mother    Depression Mother    Other Mother        VARICOSE VEINS   Anesthesia problems Other    Diabetes Father    Hypertension Sister    Arthritis Maternal Grandmother    Heart disease Maternal Grandmother    Hypertension Maternal Grandmother    Kidney disease Maternal Grandmother        FAILURE   Prostate cancer Maternal Grandfather    Depression Maternal Aunt    Sarcoidosis Maternal Aunt    Asthma Cousin    Liver disease Cousin        requiring liver transplant- unknown as to what type of liver disease   Colon cancer Neg Hx    Esophageal cancer Neg Hx    Pancreatic cancer Neg Hx    Stomach cancer Neg Hx    Inflammatory bowel disease Neg Hx    Rectal cancer Neg Hx     SOCIAL HISTORY: Social History   Socioeconomic History   Marital status: Married    Spouse name: HASAN   Number of children: 2   Years of education: 14+   Highest education level: Not on file  Occupational History   Occupation: Retail banker: [employer]  Tobacco Use   Smoking status: Never   Smokeless tobacco: Never  Vaping Use   Vaping Use: Never used  Substance and Sexual Activity   Alcohol use: Yes    Alcohol/week: 3.0 standard drinks    Types: 2 Glasses of wine, 1 Shots of liquor per week    Comment: weekends   Drug use: No   Sexual activity: Yes    Partners: Male    Birth control/protection: Other-see comments    Comment: Ablation  Other Topics Concern   Not on file  Social History Narrative   Lives at home w/ her husband and children   Right-handed   Caffeine: 1 cup coffee daily   Social Determinants of Health   Financial Resource Strain: Not on file  Food Insecurity: Not on file  Transportation Needs: Not on file  Physical Activity: Not on file  Stress: Not on file  Social Connections: Not on file  Intimate Partner Violence: Not  on file       PHYSICAL EXAM  Vitals:   12/03/20 0902  BP: 125/80  Pulse: 92  Weight: 165 lb 4.8 oz (75 kg)  Height:  (1.6 m)    Body mass index is 29.28 kg/m.  Generalized: Well developed, in no acute distress  Cardiology: normal rate and rhythm, no murmur noted Respiratory: clear to auscultation bilaterally  Neurological examination  Mentation: Alert oriented to time, place, history taking. Follows all commands speech and language fluent Cranial nerve II-XII: Pupils were equal round reactive to light. Extraocular movements were full, visual field were full  Motor: The motor testing reveals 5 over 5 strength of all 4 extremities. Good symmetric motor tone is noted throughout.   Gait and station: Gait is normal.   DIAGNOSTIC DATA (LABS, IMAGING, TESTING) - I reviewed patient records, labs, notes, testing and imaging myself where available.  No flowsheet data found.   Lab Results  Component Value Date   WBC 7.5 02/02/2020   HGB 12.4 02/02/2020   HCT 38.0 02/02/2020   MCV 94 02/02/2020   PLT 355 02/02/2020      Component Value Date/Time   NA 136 02/02/2020 0848   K 4.0 02/02/2020 0848   CL 97 02/02/2020 0848   CO2 24 02/02/2020 0848   GLUCOSE 98 02/02/2020 0848   GLUCOSE 165 (H) 10/12/2018 1912   BUN 12 02/02/2020 0848   CREATININE 0.77 02/02/2020 0848   CALCIUM 9.3 02/02/2020 0848   PROT 7.5 02/02/2020 0848   ALBUMIN 4.7 02/02/2020 0848   AST 35 02/02/2020 0848   ALT 37 (H) 02/02/2020 0848   ALKPHOS 55 02/02/2020 0848   BILITOT 0.4 02/02/2020 0848   GFRNONAA 100 02/02/2020 0848   GFRAA 116 02/02/2020 0848   Lab Results  Component Value Date   CHOL 152 02/02/2020   HDL 55 02/02/2020   LDLCALC 76 02/02/2020   TRIG 118 02/02/2020   CHOLHDL 2.8 02/02/2020   Lab Results  Component Value Date   HGBA1C 5.5 02/02/2020   No results found for: VITAMINB12 Lab Results  Component Value Date   TSH 3.182 10/12/2018       ASSESSMENT AND PLAN 35 y.o. year  old female  has a past medical history of Abnormal Pap smear (AGE 4), Allergy, Anemia, Complication of anesthesia, Genital herpes, GERD (gastroesophageal reflux disease) (AGE 89), History of chicken pox, Hypertension, Infection (AGE 38), Infection (2007), Infection, Migraines, Ovarian cyst rupture, PONV (postoperative nausea and vomiting), Preterm labor, and Urinary incontinence. here with     ICD-10-CM   1. Chronic migraine without aura without status migrainosus, not intractable  G43.709        Angela [name] is doing well on Emgality every 30 days. Migraines are well managed and easily aborted with complementary therapies. She will continue current treatment plan. We will consider adding Nurtec or Ubrelvy if needed in the future. She was encouraged to continue healthy lifestyle habits. Follow up in 1 year, sooner if needed. She verbalizes understanding and agreement with this plan.    No orders of the defined types were placed in this encounter.    Meds ordered this encounter  Medications   Galcanezumab-gnlm (EMGALITY) 120 MG/ML SOAJ    Sig: 1INJECT  INTO THE SKIN EVERY 30 DAYS    Dispense:  3 mL    Refill:  3    Order Specific Question:   Supervising Provider    Answer:   Anson Fret [[id number]]  Shawnie Dapper, FNP-C 12/03/2020, 9:35 AM [facility] [address] [place], [place] [address] [phone]   agree with assessment and plan as stated.     Naomie Dean, MD [facility]

## 2020-12-03 ENCOUNTER — Other Ambulatory Visit: Payer: Self-pay

## 2020-12-03 ENCOUNTER — Encounter: Payer: Self-pay | Admitting: Family Medicine

## 2020-12-03 ENCOUNTER — Ambulatory Visit (INDEPENDENT_AMBULATORY_CARE_PROVIDER_SITE_OTHER): Payer: Managed Care, Other (non HMO) | Admitting: Family Medicine

## 2020-12-03 VITALS — BP 125/80 | HR 92 | Ht 63.0 in | Wt 165.3 lb

## 2020-12-03 DIAGNOSIS — G43709 Chronic migraine without aura, not intractable, without status migrainosus: Secondary | ICD-10-CM

## 2020-12-03 MED ORDER — EMGALITY 120 MG/ML ~~LOC~~ SOAJ: SUBCUTANEOUS | 3 refills | Status: DC

## 2020-12-27 ENCOUNTER — Other Ambulatory Visit: Payer: Self-pay | Admitting: Family Medicine

## 2021-01-14 ENCOUNTER — Encounter: Payer: Self-pay | Admitting: Family Medicine

## 2021-01-14 ENCOUNTER — Telehealth (INDEPENDENT_AMBULATORY_CARE_PROVIDER_SITE_OTHER): Payer: Managed Care, Other (non HMO) | Admitting: Family Medicine

## 2021-01-14 VITALS — BP 108/71 | HR 89 | Temp 97.5°F | Ht 63.0 in | Wt 158.0 lb

## 2021-01-14 DIAGNOSIS — F331 Major depressive disorder, recurrent, moderate: Secondary | ICD-10-CM | POA: Diagnosis not present

## 2021-01-14 DIAGNOSIS — F411 Generalized anxiety disorder: Secondary | ICD-10-CM | POA: Diagnosis not present

## 2021-01-14 NOTE — Assessment & Plan Note (Signed)
Poor control anxiety and depression, chronic insomnia.  Not able to afford counseling but looking in to employee assistance program at work for counselor as this was helpful.  Continue prozac 40 mg daily, trazodone for sleep( take earlier in evening) and hydroxizine for panic attack.  Encouraged stress reduction, relaxation techniques.   She is unable to work and function to full capacity given concentration and focus issues from GAD and MDD. Her work was unable to make requested adjustments to call frequency and break time, so she will likley ber unable to continuye working there as this is a trigger to her anxiety. Short term disability leave from 8/30 to 9/21 ( my next appt will be on 9/20) recommended to given her time to work on improvement in mental health state.

## 2021-01-14 NOTE — Progress Notes (Signed)
VIRTUAL VISIT Due to national recommendations of social distancing due to COVID 19, a virtual visit is felt to be most appropriate for this patient at this time.   I connected with the patient on 01/14/21 at  9:20 AM EDT by virtual telehealth platform and verified that I am speaking with the correct person using two identifiers.   I discussed the limitations, risks, security and privacy concerns of performing an evaluation and management service by  virtual telehealth platform and the availability of in person appointments. I also discussed with the patient that there may be a patient responsible charge related to this service. The patient expressed understanding and agreed to proceed.  Patient location: Home Provider Location: [facility] [facility] Participants: Angela Angela [name] and Angela Angela [name]   Chief Complaint  Patient presents with   discuss short term disability    Anxiety   Depression    History of Present Illness: 35 year old Angela [name] presents for discussion of need for short term disability for recurrent generalized anxiety and depression.  She reports  she has had worsening of mood in last 2-3 weeks.  She has noted increase in frequency of panic attacks ( occurring 3 times a week), decreased focus, frequent mood swings.  Trouble concentrating watching TV or reading and at work. She has  had decreased appetite and has lost 8 lbs in last 2-3 weeks.   She is having considerable issue with staying asleep, early morning waking.  Work situation... triggers anxiety y and worsens panic attacks. Manager was unable to stick to recommendations of longer space between calls for decompression. She still ahs a high frequency of clint interaction on phone.   She feels he need 2 weeks for mental health improvement. Fluoxetine 40 mg daily, no SE  Has hydroxyzine to use prn panic attacks.  Trazodone 25-50 mg as needed for insomnia... if takes to late makes her sluggish in AM. Was seeing a  counselor until was unable to afford. He current job Scientist, research (physical sciences).  She was last at work 8/30.Marland Kitchen requests leave until 9/21.Marland Kitchen will  Depression screen [facility] 2/9 01/14/2021 11/05/2020 10/04/2020  Decreased Interest 2 1 2   Down, Depressed, Hopeless 1 1 1   PHQ - 2 Score 3 2 3   Altered sleeping 3 2 1   Tired, decreased energy - 1 1  Change in appetite 2 1 1   Feeling bad or failure about yourself  1 0 1  Trouble concentrating 3 1 2   Moving slowly or fidgety/restless 0 0 1  Suicidal thoughts 0 0 0  PHQ-9 Score 12 7 10   Difficult doing work/chores Very difficult Very difficult Very difficult  Some recent data might be hidden    GAD 7 : Generalized Anxiety Score 01/14/2021 11/05/2020 10/04/2020 06/28/2020  Nervous, Anxious, on Edge 1 1 2 2   Control/stop worrying 1 1 2 2   Worry too much - different things 1 1 2 2   Trouble relaxing 1 1 1 1   Restless 1 0 2 0  Easily annoyed or irritable 2 2 1 2   Afraid - awful might happen 1 1 1 2   Total GAD 7 Score 8 7 11 11   Anxiety Difficulty Very difficult Somewhat difficult Very difficult Very difficult          COVID 19 screen No recent travel or known exposure to COVID19 The patient denies respiratory symptoms of COVID 19 at this time.  The importance of social distancing was discussed today.   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 She has never used smokeless tobacco. She reports current alcohol  use of about 3.0 standard drinks per week. She reports that she does not use drugs.   Current Outpatient Medications:    amLODipine (NORVASC) 10 MG tablet, Take 1 tablet (10 mg total) by mouth daily., Disp: 90 tablet, Rfl: 1   cyclobenzaprine (FLEXERIL) 10 MG tablet, TAKE 1 TABLET (10 MG TOTAL) BY MOUTH AT BEDTIME AS NEEDED FOR MUSCLE SPASMS., Disp: 15 tablet, Rfl: 0   FLUoxetine (PROZAC) 40 MG capsule, TAKE 1 CAPSULE DAILY, Disp: 90 capsule, Rfl: 1   Galcanezumab-gnlm (EMGALITY) 120 MG/ML SOAJ, 1INJECT 120MG  INTO THE SKIN EVERY 30 DAYS, Disp: 3 mL, Rfl: 3   hydrOXYzine (ATARAX/VISTARIL) 10 MG tablet, Take 1 tablet (10 mg total) by mouth 3 (three) times daily as needed for anxiety., Disp: 30 tablet, Rfl: 0   losartan (COZAAR) 100 MG tablet, Take 1 tablet (100 mg total) by mouth daily., Disp: 90 tablet, Rfl: 1   metoprolol succinate (TOPROL-XL) 25 MG 24 hr tablet, Take 1 tablet (25 mg total) by mouth daily., Disp: 30 tablet, Rfl: 0   pantoprazole (PROTONIX) 40 MG tablet, Take 1 tablet (40 mg total) by mouth daily., Disp: 90 tablet, Rfl: 1   potassium chloride SA (KLOR-CON M20) 20 MEQ tablet, TAKE 1 TABLET EVERY SUNDAY, MONDAY, WEDNESDAY AND FRIDAY, Disp: 48 tablet, Rfl: 1   promethazine (PHENERGAN) 25 MG tablet, TAKE 1 TABLET (25 MG TOTAL) BY MOUTH EVERY 6 (SIX) HOURS AS NEEDED FOR NAUSEA OR VOMITING., Disp: 30 tablet, Rfl: 0   tolterodine (DETROL LA) Angela MG 24 hr capsule, Take 1 capsule (Angela mg total) by mouth daily., Disp: 90 capsule, Rfl: 1   traZODone (DESYREL) 50 MG tablet, TAKE 0.5-1 TABLETS BY MOUTH AT BEDTIME AS NEEDED FOR SLEEP., Disp: 90 tablet, Rfl: 0   valACYclovir (VALTREX) 1000 MG tablet, Take 1 tablet (1,000 mg total) by mouth 3 (three) times daily., Disp: 21 tablet, Rfl: 0   Observations/Objective: Vitals:   01/14/21 1003  BP: 108/71  Pulse: 89  Temp: (!) 97.5 F (36.Angela C)     Physical Exam  Physical Exam Constitutional:      General: The patient is not in acute  distress. Pulmonary:     Effort: Pulmonary effort is normal. No respiratory distress.  Neurological:     Mental Status: The patient is alert and oriented to person, place, and time.  Psychiatric:        Mood and Affect: Mood depressed, flat affect, slowed thinking  , trouble responding to qestions without distraction.   Alert and oriented  Assessment and Plan Problem List Items Addressed This Visit     GAD (generalized anxiety disorder)   MDD (major depressive disorder), recurrent episode, moderate (  HCC) - Primary   Poor control anxiety and depression, chronic insomnia.  Not able to afford counseling but looking in to employee assistance program at work for counselor as this was helpful.  Continue prozac 40 mg daily, trazodone for sleep( take earlier in evening) and hydroxizine for panic attack.  Encouraged stress reduction, relaxation techniques.   She is unable to work and function to full capacity given concentration and focus issues from GAD and MDD. Her work was unable to make requested adjustments to call frequency and break time, so she will likley ber unable to continuye working there as this is a trigger to her anxiety. Short term disability leave from 8/30 to 9/21 ( my next appt will be on 9/20) recommended to given her time to work on improvement in mental health state.   I discussed the assessment and treatment plan with the patient. The patient was provided an opportunity to ask questions and all were answered. The patient agreed with the plan and demonstrated an understanding of the instructions.   The patient was advised to call back or seek an in-person evaluation if the symptoms worsen or if the condition fails to improve as anticipated.     Angela Nora, MD

## 2021-01-21 DIAGNOSIS — Z0279 Encounter for issue of other medical certificate: Secondary | ICD-10-CM

## 2021-01-27 ENCOUNTER — Other Ambulatory Visit: Payer: Self-pay | Admitting: Family Medicine

## 2021-01-27 MED ORDER — PANTOPRAZOLE SODIUM 40 MG PO TBEC: 40.0000 mg | DELAYED_RELEASE_TABLET | Freq: Every day | ORAL | 1 refills | Status: DC

## 2021-01-28 ENCOUNTER — Encounter: Payer: Self-pay | Admitting: Family Medicine

## 2021-01-28 ENCOUNTER — Telehealth (INDEPENDENT_AMBULATORY_CARE_PROVIDER_SITE_OTHER): Payer: Managed Care, Other (non HMO) | Admitting: Family Medicine

## 2021-01-28 ENCOUNTER — Other Ambulatory Visit: Payer: Self-pay

## 2021-01-28 VITALS — BP 118/69 | HR 83 | Temp 97.5°F | Ht 63.0 in | Wt 158.0 lb

## 2021-01-28 DIAGNOSIS — F411 Generalized anxiety disorder: Secondary | ICD-10-CM | POA: Diagnosis not present

## 2021-01-28 DIAGNOSIS — F331 Major depressive disorder, recurrent, moderate: Secondary | ICD-10-CM | POA: Diagnosis not present

## 2021-01-28 NOTE — Progress Notes (Deleted)
Patient ID: Angela [name], female    DOB: November 29, 1985, 35 y.o.   MRN: [id number]  This visit was conducted in person.  BP 118/69   Pulse 83   Temp (!) 97.5 F (36.4 C) (Temporal)   Ht 5\' 3"  (1.6 m)   Wt 158 lb (71.7 kg)   BMI 27.99 kg/m    CC:  Chief Complaint  Patient presents with   Anxiety   Depression   returning to [employer]    Subjective:   HPI: Angela [name] is a 35 y.o. female presenting on 01/28/2021 for Anxiety, Depression, and returning to [employer]   In last 2 weeks she reports she has noted some improvement in her mood. She feels like she is headed in the right direction. Still having some issues with insomnia.   No panic attacks at home.. felt one start and she was able to use behavioral techniques to calm down.   Current job has previously been unable to accommodate her needs .. with decreased client load and frequent breaks or time between calls. Her anxiety re: phone calls  seems to accumulate She is excited about a new job possibility.. has third interview with  a company with no phone client interaction.   Feels Prozac is helping at the current dose. No SE.  Occ using 1/2 tablet of trazodone for sleep as needed.   Depression screen [facility] 2/9 01/28/2021 01/14/2021 11/05/2020  Decreased Interest 1 2 1   Down, Depressed, Hopeless 0 1 1  PHQ - 2 Score 1 3 2   Altered sleeping 2 3 2   Tired, decreased energy 1 - 1  Change in appetite 1 2 1   Feeling bad or failure about yourself  0 1 0  Trouble concentrating 2 3 1   Moving slowly or fidgety/restless 0 0 0  Suicidal thoughts 0 0 0  PHQ-9 Score 7 12 7   Difficult doing work/chores Somewhat difficult Very difficult Very difficult  Some recent data might be hidden    GAD 7 : Generalized Anxiety Score 01/28/2021 01/14/2021 11/05/2020 10/04/2020  Nervous, Anxious, on Edge 1 1 1 2   Control/stop worrying 1 1 1 2   Worry too much - different things 1 1 1 2   Trouble relaxing 1 1 1 1   Restless 0 1 0 2  Easily annoyed or  irritable 2 2 2 1   Afraid - awful might happen 1 1 1 1   Total GAD 7 Score 7 8 7 11   Anxiety Difficulty Somewhat difficult Very difficult Somewhat difficult Very difficult          Relevant past medical, surgical, family and social history reviewed and updated as indicated. Interim medical history since our last visit reviewed. Allergies and medications reviewed and updated. Outpatient Medications Prior to Visit  Medication Sig Dispense Refill   amLODipine (NORVASC) 10 MG tablet Take 1 tablet (10 mg total) by mouth daily. 90 tablet 1   cyclobenzaprine (FLEXERIL) 10 MG tablet TAKE 1 TABLET (10 MG TOTAL) BY MOUTH AT BEDTIME AS NEEDED FOR MUSCLE SPASMS. 15 tablet 0   FLUoxetine (PROZAC) 40 MG capsule TAKE 1 CAPSULE DAILY 90 capsule 1   Galcanezumab-gnlm (EMGALITY) 120 MG/ML SOAJ 1INJECT 120MG  INTO THE SKIN EVERY 30 DAYS 3 mL 3   hydrOXYzine (ATARAX/VISTARIL) 10 MG tablet Take 1 tablet (10 mg total) by mouth 3 (three) times daily as needed for anxiety. 30 tablet 0   losartan (COZAAR) 100 MG tablet TAKE 1 TABLET BY MOUTH EVERY DAY 90 tablet 0  metoprolol succinate (TOPROL-XL) 25 MG 24 hr tablet Take 1 tablet (25 mg total) by mouth daily. 30 tablet 0   pantoprazole (PROTONIX) 40 MG tablet Take 1 tablet (40 mg total) by mouth daily. 90 tablet 1   potassium chloride SA (KLOR-CON M20) 20 MEQ tablet TAKE 1 TABLET EVERY SUNDAY, MONDAY, WEDNESDAY AND FRIDAY 48 tablet 1   promethazine (PHENERGAN) 25 MG tablet TAKE 1 TABLET (25 MG TOTAL) BY MOUTH EVERY 6 (SIX) HOURS AS NEEDED FOR NAUSEA OR VOMITING. 30 tablet 0   tolterodine (DETROL LA) 4 MG 24 hr capsule Take 1 capsule (4 mg total) by mouth daily. 90 capsule 1   traZODone (DESYREL) 50 MG tablet TAKE 0.5-1 TABLETS BY MOUTH AT BEDTIME AS NEEDED FOR SLEEP. 90 tablet 0   valACYclovir (VALTREX) 1000 MG tablet Take 1 tablet (1,000 mg total) by mouth 3 (three) times daily. 21 tablet 0   No facility-administered medications prior to visit.     Per HPI  unless specifically indicated in ROS section below Review of Systems  Constitutional:  Negative for fatigue and fever.  HENT:  Negative for ear pain.   Eyes:  Negative for pain.  Respiratory:  Negative for chest tightness and shortness of breath.   Cardiovascular:  Negative for chest pain, palpitations and leg swelling.  Gastrointestinal:  Negative for abdominal pain.  Genitourinary:  Negative for dysuria.  Objective:  BP 118/69   Pulse 83   Temp (!) 97.5 F (36.4 C) (Temporal)   Ht 5\' 3"  (1.6 m)   Wt 158 lb (71.7 kg)   BMI 27.99 kg/m   Wt Readings from Last 3 Encounters:  01/28/21 158 lb (71.7 kg)  01/14/21 158 lb (71.7 kg)  12/03/20 165 lb 4.8 oz (75 kg)      Physical Exam Constitutional:      General: The patient is not in acute distress. Pulmonary:     Effort: Pulmonary effort is normal. No respiratory distress.  Neurological:     Mental Status: The patient is alert and oriented to person, place, and time.  Psychiatric:        Mood and Affect: Mood normal.        Behavior: Behavior normal.      Results for orders placed or performed in visit on 02/02/20  Hemoglobin A1c  Result Value Ref Range   Hgb A1c MFr Bld 5.5 4.8 - 5.6 %   Est. average glucose Bld gHb Est-mCnc 111 mg/dL  Lipid panel  Result Value Ref Range   Cholesterol, Total 152 100 - 199 mg/dL   Triglycerides 02/04/20 0 - 149 mg/dL   HDL 55 675 mg/dL   VLDL Cholesterol Cal 21 5 - 40 mg/dL   LDL Chol Calc (NIH) 76 0 - 99 mg/dL   Chol/HDL Ratio 2.8 0.0 - 4.4 ratio  Comprehensive metabolic panel  Result Value Ref Range   Glucose 98 65 - 99 mg/dL   BUN 12 6 - 20 mg/dL   Creatinine, Ser >91 0.57 - 1.00 mg/dL   GFR calc non Af Amer 100 >59 mL/min/1.73   GFR calc Af Amer 116 >59 mL/min/1.73   BUN/Creatinine Ratio 16 9 - 23   Sodium 136 134 - 144 mmol/L   Potassium 4.0 3.5 - 5.2 mmol/L   Chloride 97 96 - 106 mmol/L   CO2 24 20 - 29 mmol/L   Calcium 9.3 8.7 - 10.2 mg/dL   Total Protein 7.5 6.0 - 8.5 g/dL    Albumin 4.7 3.8 -  4.8 g/dL   Globulin, Total 2.8 1.5 - 4.5 g/dL   Albumin/Globulin Ratio 1.7 1.2 - 2.2   Bilirubin Total 0.4 0.0 - 1.2 mg/dL   Alkaline Phosphatase 55 44 - 121 IU/L   AST 35 0 - 40 IU/L   ALT 37 (H) 0 - 32 IU/L  CBC with Differential/Platelet  Result Value Ref Range   WBC 7.5 3.4 - 10.8 x10E3/uL   RBC 4.03 3.77 - 5.28 x10E6/uL   Hemoglobin 12.4 11.1 - 15.9 g/dL   Hematocrit 09.8 11.9 - 46.6 %   MCV 94 79 - 97 fL   MCH 30.8 26.6 - 33.0 pg   MCHC 32.6 31.5 - 35.7 g/dL   RDW 14.7 82.9 - 56.2 %   Platelets 355 150 - 450 x10E3/uL   Neutrophils 49 Not Estab. %   Lymphs 36 Not Estab. %   Monocytes 11 Not Estab. %   Eos 2 Not Estab. %   Basos 1 Not Estab. %   Neutrophils Absolute 3.8 1.4 - 7.0 x10E3/uL   Lymphocytes Absolute 2.7 0.7 - 3.1 x10E3/uL   Monocytes Absolute 0.8 0.1 - 0.9 x10E3/uL   EOS (ABSOLUTE) 0.1 0.0 - 0.4 x10E3/uL   Basophils Absolute 0.0 0.0 - 0.2 x10E3/uL   Immature Granulocytes 1 Not Estab. %   Immature Grans (Abs) 0.1 0.0 - 0.1 x10E3/uL    This visit occurred during the SARS-CoV-2 public health emergency.  Safety protocols were in place, including screening questions prior to the visit, additional usage of staff PPE, and extensive cleaning of exam room while observing appropriate contact time as indicated for disinfecting solutions.   COVID 19 screen:  No recent travel or known exposure to COVID19 The patient denies respiratory symptoms of COVID 19 at this time. The importance of social distancing was discussed today.   Assessment and Plan    Problem List Items Addressed This Visit     GAD (generalized anxiety disorder)   MDD (major depressive disorder), recurrent episode, moderate (HCC) - Primary   Improving control but not resolved.  Encouraged continued behavioral techniques for panic attack arrest. She has learned multiple skills in this area via psychologist visits. Continue prozac 40 mg daily, trazodone prn insomnia.  She is  cleared to return to work  on Tuesday 02/04/2021 with accomodation of decreased patient load  and frequent breaks/ increased tie between calls.    [name], MD

## 2021-01-29 ENCOUNTER — Telehealth: Payer: Self-pay | Admitting: Family Medicine

## 2021-01-29 NOTE — Progress Notes (Addendum)
VIRTUAL VISIT Due to national recommendations of social distancing due to COVID 19, a virtual visit is felt to be most appropriate for this patient at this time.   I connected with the patient on 01/29/21 at  4:00 PM EDT by virtual telehealth platform and verified that I am speaking with the correct person using two identifiers.   I discussed the limitations, risks, security and privacy concerns of performing an evaluation and management service by  virtual telehealth platform and the availability of in person appointments. I also discussed with the patient that there may be a patient responsible charge related to this service. The patient expressed understanding and agreed to proceed.  Patient location: Home Provider Location: [facility] Participants: [name] and [name]   Chief Complaint  Patient presents with   Anxiety   Depression   returning to [facility]    History of Present Illness: Angela [name] is a 35 y.o. female presenting on 01/28/2021 for Anxiety, Depression, and returning to [employer]    In last 2 weeks she reports she has noted some improvement in her mood. She feels like she is headed in the right direction. Still having some issues with insomnia.    No panic attacks at home.. felt one start and she was able to use behavioral techniques to calm down.    Current job has previously been unable to accommodate her needs .. with decreased client load and frequent breaks or time between calls. Her anxiety re: phone calls  seems to accumulate She is excited about a new job possibility.. has third interview with  a company with no phone client interaction.    Feels Prozac is helping at the current dose. No SE.  Occ using 1/2 tablet of trazodone for sleep as needed.     Depression screen [facility] 2/9 01/28/2021 01/14/2021 11/05/2020  Decreased Interest 1 2 1   Down, Depressed, Hopeless 0 1 1  PHQ - 2 Score 1 3 2   Altered sleeping 2 3 2   Tired, decreased  energy 1 - 1  Change in appetite 1 2 1   Feeling bad or failure about yourself  0 1 0  Trouble concentrating 2 3 1   Moving slowly or fidgety/restless 0 0 0  Suicidal thoughts 0 0 0  PHQ-9 Score 7 12 7   Difficult doing work/chores Somewhat difficult Very difficult Very difficult  Some recent data might be hidden    GAD 7 : Generalized Anxiety Score 01/28/2021 01/14/2021 11/05/2020 10/04/2020  Nervous, Anxious, on Edge 1 1 1 2   Control/stop worrying 1 1 1 2   Worry too much - different things 1 1 1 2   Trouble relaxing 1 1 1 1   Restless 0 1 0 2  Easily annoyed or irritable 2 2 2 1   Afraid - awful might happen 1 1 1 1   Total GAD 7 Score 7 8 7 11   Anxiety Difficulty Somewhat difficult Very difficult Somewhat difficult Very difficult             COVID 19 screen No recent travel or known exposure to COVID19 The patient denies respiratory symptoms of COVID 19 at this time.  The importance of social distancing was discussed today.   Review of Systems  Constitutional:  Negative for chills and fever.  HENT:  Negative for congestion and ear pain.   Eyes:  Negative for pain and redness.  Respiratory:  Negative for cough and shortness of breath.   Cardiovascular:  Negative for chest pain, palpitations  and leg swelling.  Gastrointestinal:  Negative for abdominal pain, blood in stool, constipation, diarrhea, nausea and vomiting.  Genitourinary:  Negative for dysuria.  Musculoskeletal:  Negative for falls and myalgias.  Skin:  Negative for rash.  Neurological:  Negative for dizziness.  Psychiatric/Behavioral:  Negative for depression. The patient is not nervous/anxious.      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 She has never used smokeless tobacco. She reports current alcohol use of about 3.0 standard drinks per week. She reports that she does not use drugs.   Current Outpatient Medications:    amLODipine (NORVASC) 10 MG tablet, Take 1 tablet (10 mg total) by mouth daily., Disp: 90 tablet, Rfl: 1   cyclobenzaprine (FLEXERIL) 10 MG tablet, TAKE 1 TABLET (10 MG TOTAL) BY MOUTH AT BEDTIME AS NEEDED FOR MUSCLE SPASMS., Disp: 15 tablet, Rfl: 0   FLUoxetine (PROZAC) 40 MG capsule, TAKE 1 CAPSULE DAILY, Disp: 90 capsule, Rfl: 1   Galcanezumab-gnlm (EMGALITY) 120 MG/ML SOAJ, 1INJECT 120MG  INTO THE SKIN EVERY 30 DAYS, Disp: 3 mL, Rfl: 3   hydrOXYzine (ATARAX/VISTARIL) 10 MG tablet, Take 1 tablet (10 mg total) by mouth 3 (three) times daily as needed for anxiety., Disp: 30 tablet, Rfl: 0   losartan (COZAAR) 100 MG tablet, TAKE 1 TABLET BY MOUTH EVERY DAY, Disp: 90 tablet, Rfl: 0   metoprolol succinate (TOPROL-XL) 25 MG 24 hr tablet, Take 1 tablet (25 mg total) by mouth daily., Disp: 30 tablet, Rfl: 0   pantoprazole (PROTONIX) 40 MG tablet, Take 1 tablet (40 mg total) by mouth daily., Disp: 90 tablet, Rfl: 1   potassium chloride SA (KLOR-CON M20) 20 MEQ tablet, TAKE 1 TABLET EVERY SUNDAY, MONDAY, WEDNESDAY AND FRIDAY, Disp: 48 tablet, Rfl: 1   promethazine (PHENERGAN) 25 MG tablet, TAKE 1 TABLET (25 MG TOTAL) BY MOUTH EVERY 6 (SIX) HOURS AS NEEDED FOR NAUSEA OR VOMITING., Disp: 30 tablet, Rfl: 0   tolterodine (DETROL LA) 4 MG 24 hr capsule, Take 1 capsule (4 mg total) by mouth daily., Disp: 90 capsule, Rfl: 1   traZODone (DESYREL) 50 MG tablet, TAKE 0.5-1 TABLETS BY MOUTH AT BEDTIME AS NEEDED FOR SLEEP., Disp: 90 tablet, Rfl: 0   valACYclovir (VALTREX) 1000 MG tablet,  Take 1 tablet (1,000 mg total) by mouth 3 (three) times daily., Disp: 21 tablet, Rfl: 0   Observations/Objective: Blood pressure 118/69, pulse 83, temperature (!) 97.5 F (36.4 C), temperature source Temporal, height 5\' 3"  (1.6 m), weight 158 lb (71.7 kg).  Physical Exam  Physical Exam Constitutional:      General: The patient is not in acute distress. Pulmonary:     Effort: Pulmonary effort is normal. No respiratory distress.  Neurological:     Mental Status: The patient is alert and oriented to person,  place, and time.  Psychiatric:        Mood and Affect: Mood normal.        Behavior: Behavior normal.   Assessment and Plan Problem List Items Addressed This Visit       GAD (generalized anxiety disorder)    MDD (major depressive disorder), recurrent episode, moderate (HCC) - Primary    Improving control but not resolved.  Encouraged continued behavioral techniques for panic attack arrest. She has learned multiple skills in this area via psychologist visits. Continue prozac 40 mg daily, trazodone prn insomnia.   She is cleared to return to work  on Tuesday 02/04/2021 with accomodation of decreased patient load  and frequent breaks/ increased tie between calls.   I discussed the assessment and treatment plan with the patient. The patient was provided an opportunity to ask questions and all were answered. The patient agreed with the plan and demonstrated an understanding of the instructions.   The patient was advised to call back or seek an in-person evaluation if the symptoms worsen or if the condition fails to improve as anticipated.     [name], MD

## 2021-01-29 NOTE — Telephone Encounter (Signed)
[employer] called stating that pt wants to return back to work on 9/27 instead of 9/21 if approved please send addend to [employer]

## 2021-01-30 NOTE — Telephone Encounter (Signed)
Updated return to work date on previous forms that were submitted on 09/13/022.  Forms and office note from 01/28/2021 visit faxed to [facility] at [phone].

## 2021-01-30 NOTE — Telephone Encounter (Signed)
I approved leave until 9/27... you can send the office note ( which states this) or do they have paperwork?

## 2021-02-06 ENCOUNTER — Encounter: Payer: Self-pay | Admitting: Family Medicine

## 2021-02-06 NOTE — Progress Notes (Signed)
Sent note via MyChart. Also printed and signed in outbox if patient needs copy with signature.

## 2021-02-06 NOTE — Telephone Encounter (Signed)
Printed letter in my outbox with signature.. I also sent letter to patient through [email or web address] Kitchen as she may not need with signature.

## 2021-02-23 ENCOUNTER — Other Ambulatory Visit: Payer: Self-pay | Admitting: Family Medicine

## 2021-03-06 ENCOUNTER — Telehealth: Payer: Self-pay | Admitting: *Deleted

## 2021-03-06 NOTE — Telephone Encounter (Signed)
Submitted PA Emgality on CMM. Key: [id number]. Waiting on determination from [employer].

## 2021-03-10 NOTE — Telephone Encounter (Signed)
PA approved 03/07/2021 to 03/07/2022. PA# [employer] [id number]

## 2021-03-26 MED ORDER — TOLTERODINE TARTRATE ER 4 MG PO CP24
4.0000 mg | ORAL_CAPSULE | Freq: Every day | ORAL | 0 refills | Status: DC
Start: 2021-03-26 — End: 2021-03-28

## 2021-03-28 MED ORDER — TOLTERODINE TARTRATE ER 4 MG PO CP24: 4.0000 mg | ORAL_CAPSULE | Freq: Every day | ORAL | 0 refills | Status: DC

## 2021-03-28 NOTE — Addendum Note (Signed)
Addended by: Damita Lack on: 03/28/2021 02:20 PM   Modules accepted: Orders

## 2021-04-15 ENCOUNTER — Other Ambulatory Visit: Payer: Managed Care, Other (non HMO)

## 2021-04-18 ENCOUNTER — Ambulatory Visit (INDEPENDENT_AMBULATORY_CARE_PROVIDER_SITE_OTHER): Payer: No Typology Code available for payment source | Admitting: Family Medicine

## 2021-04-18 ENCOUNTER — Other Ambulatory Visit (INDEPENDENT_AMBULATORY_CARE_PROVIDER_SITE_OTHER): Payer: No Typology Code available for payment source

## 2021-04-18 ENCOUNTER — Other Ambulatory Visit (HOSPITAL_COMMUNITY)
Admission: RE | Admit: 2021-04-18 | Discharge: 2021-04-18 | Disposition: A | Discharge status: Home / Self Care | Payer: No Typology Code available for payment source | Source: Ambulatory Visit | Attending: Family Medicine | Admitting: Family Medicine

## 2021-04-18 ENCOUNTER — Other Ambulatory Visit: Payer: Self-pay

## 2021-04-18 ENCOUNTER — Telehealth: Payer: Self-pay | Admitting: Family Medicine

## 2021-04-18 ENCOUNTER — Encounter: Payer: Self-pay | Admitting: Family Medicine

## 2021-04-18 VITALS — BP 120/80 | HR 83 | Temp 98.0°F | Ht 63.25 in | Wt 160.1 lb

## 2021-04-18 DIAGNOSIS — Z124 Encounter for screening for malignant neoplasm of cervix: Secondary | ICD-10-CM | POA: Diagnosis not present

## 2021-04-18 DIAGNOSIS — I10 Essential (primary) hypertension: Secondary | ICD-10-CM

## 2021-04-18 DIAGNOSIS — F331 Major depressive disorder, recurrent, moderate: Secondary | ICD-10-CM | POA: Diagnosis not present

## 2021-04-18 DIAGNOSIS — Z0000 Encounter for general adult medical examination without abnormal findings: Secondary | ICD-10-CM

## 2021-04-18 DIAGNOSIS — F411 Generalized anxiety disorder: Secondary | ICD-10-CM

## 2021-04-18 DIAGNOSIS — R7303 Prediabetes: Secondary | ICD-10-CM

## 2021-04-18 DIAGNOSIS — Z1159 Encounter for screening for other viral diseases: Secondary | ICD-10-CM

## 2021-04-18 DIAGNOSIS — R69 Illness, unspecified: Secondary | ICD-10-CM | POA: Diagnosis not present

## 2021-04-18 LAB — LIPID PANEL
Cholesterol: 147 mg/dL (ref 0–200)
HDL: 73.1 mg/dL (ref >39.00)
LDL Cholesterol: 45 mg/dL (ref 0–99)
NonHDL: 73.96
Total CHOL/HDL Ratio: 2
Triglycerides: 145 mg/dL (ref 0.0–149.0)
VLDL: 29 mg/dL (ref 0.0–40.0)

## 2021-04-18 LAB — COMPREHENSIVE METABOLIC PANEL
ALT: 23 U/L (ref 0–35)
AST: 32 U/L (ref 0–37)
Albumin: 4.2 g/dL (ref 3.5–5.2)
Alkaline Phosphatase: 52 U/L (ref 39–117)
BUN: 7 mg/dL (ref 6–23)
CO2: 24 mEq/L (ref 19–32)
Calcium: 8.6 mg/dL (ref 8.4–10.5)
Chloride: 100 mEq/L (ref 96–112)
Creatinine, Ser: 0.66 mg/dL (ref 0.40–1.20)
GFR: 112.58 mL/min (ref >60.00)
Glucose, Bld: 85 mg/dL (ref 70–99)
Potassium: 3.4 mEq/L — ABNORMAL LOW (ref 3.5–5.1)
Sodium: 138 mEq/L (ref 135–145)
Total Bilirubin: 0.5 mg/dL (ref 0.2–1.2)
Total Protein: 7 g/dL (ref 6.0–8.3)

## 2021-04-18 LAB — HEMOGLOBIN A1C: Hgb A1c MFr Bld: 5.4 % (ref 4.6–6.5)

## 2021-04-18 NOTE — Assessment & Plan Note (Signed)
Stable, chronic.  Continue current medication.    losartan 100 mg daily  amlodipine 10 mg daily

## 2021-04-18 NOTE — Patient Instructions (Addendum)
Keep  up great work  on healthy healthy diet and regular exercise.

## 2021-04-18 NOTE — Progress Notes (Signed)
Patient ID: Angela [name], female    DOB: [birth year], 35 y.o.   MRN: [id number]  This visit was conducted in person.  BP 120/80   Pulse 83   Temp 98 F (36.7 C) (Temporal)   Ht 5' 3.25" (1.607 m)   Wt 160 lb 2 oz (72.6 kg)   SpO2 98%   BMI 28.14 kg/m    CC:  Chief Complaint  Patient presents with   Annual Exam    Subjective:   HPI: Angela [name] is a 35 y.o. female presenting on 04/18/2021 for Annual Exam  Hypertension:   At goal on amlodipine 10, losartan 100 mg daily, BP Readings from Last 3 Encounters:  04/18/21 120/80  01/28/21 118/69  01/14/21 108/71  Using medication without problems or lightheadedness:  none Chest pain with exertion: none Edema:none Short of breath: none Average home BPs: Other issues:   Diet: moderate  Exercise: off and on.   MDD/GAD, panic attacks:   good control fluoxetine 40 mg daily, t No longer needing trazodone at night.   She has gotten a new job.. no phones involved.    Migraine, well controlled with Emgality  Relevant past medical, surgical, family and social history reviewed and updated as indicated. Interim medical history since our last visit reviewed. Allergies and medications reviewed and updated. Outpatient Medications Prior to Visit  Medication Sig Dispense Refill   amLODipine (NORVASC) 10 MG tablet Take 1 tablet (10 mg total) by mouth daily. 90 tablet 1   cyclobenzaprine (FLEXERIL) 10 MG tablet TAKE 1 TABLET (10 MG TOTAL) BY MOUTH AT BEDTIME AS NEEDED FOR MUSCLE SPASMS. 15 tablet 0   FLUoxetine (PROZAC) 40 MG capsule TAKE 1 CAPSULE DAILY 90 capsule 1   Galcanezumab-gnlm (EMGALITY) 120 MG/ML SOAJ 1INJECT 120MG  INTO THE SKIN EVERY 30 DAYS 3 mL 3   hydrOXYzine (ATARAX/VISTARIL) 10 MG tablet Take 1 tablet (10 mg total) by mouth 3 (three) times daily as needed for anxiety. 30 tablet 0   losartan (COZAAR) 100 MG tablet TAKE 1 TABLET BY MOUTH EVERY DAY 90 tablet 0   metoprolol succinate (TOPROL-XL) 25 MG 24 hr  tablet Take 1 tablet (25 mg total) by mouth daily. 30 tablet 0   pantoprazole (PROTONIX) 40 MG tablet Take 1 tablet (40 mg total) by mouth daily. 90 tablet 1   potassium chloride SA (KLOR-CON M20) 20 MEQ tablet TAKE 1 TABLET EVERY SUNDAY, MONDAY, WEDNESDAY AND FRIDAY 48 tablet 1   promethazine (PHENERGAN) 25 MG tablet TAKE 1 TABLET (25 MG TOTAL) BY MOUTH EVERY 6 (SIX) HOURS AS NEEDED FOR NAUSEA OR VOMITING. 30 tablet 0   tolterodine (DETROL LA) 4 MG 24 hr capsule Take 1 capsule (4 mg total) by mouth daily. 90 capsule 0   traZODone (DESYREL) 50 MG tablet TAKE 1/2 TO 1 TABLET BY MOUTH AT BEDTIME AS NEEDED FOR SLEEP 90 tablet 1   valACYclovir (VALTREX) 1000 MG tablet Take 1 tablet (1,000 mg total) by mouth 3 (three) times daily. 21 tablet 0   No facility-administered medications prior to visit.     Per HPI unless specifically indicated in ROS section below Review of Systems  Constitutional:  Negative for fatigue and fever.  HENT:  Negative for congestion.   Eyes:  Negative for pain.  Respiratory:  Negative for cough and shortness of breath.   Cardiovascular:  Negative for chest pain, palpitations and leg swelling.  Gastrointestinal:  Negative for abdominal pain.  Genitourinary:  Negative for dysuria and vaginal  bleeding.  Musculoskeletal:  Negative for back pain.  Neurological:  Negative for syncope, light-headedness and headaches.  Psychiatric/Behavioral:  Negative for dysphoric mood.   Objective:  BP 120/80   Pulse 83   Temp 98 F (36.7 C) (Temporal)   Ht 5' 3.25" (1.607 m)   Wt 160 lb 2 oz (72.6 kg)   SpO2 98%   BMI 28.14 kg/m   Wt Readings from Last 3 Encounters:  04/18/21 160 lb 2 oz (72.6 kg)  01/28/21 158 lb (71.7 kg)  01/14/21 158 lb (71.7 kg)      Physical Exam Exam conducted with a chaperone present.  Constitutional:      General: She is not in acute distress.    Appearance: Normal appearance. She is well-developed. She is not ill-appearing or toxic-appearing.   HENT:     Head: Normocephalic.     Right Ear: Hearing, tympanic membrane, ear canal and external ear normal.     Left Ear: Hearing, tympanic membrane, ear canal and external ear normal.     Nose: Nose normal.  Eyes:     General: Lids are normal. Lids are everted, no foreign bodies appreciated.     Conjunctiva/sclera: Conjunctivae normal.     Pupils: Pupils are equal, round, and reactive to light.  Neck:     Thyroid: No thyroid mass or thyromegaly.     Vascular: No carotid bruit.     Trachea: Trachea normal.  Cardiovascular:     Rate and Rhythm: Normal rate and regular rhythm.     Heart sounds: Normal heart sounds, S1 normal and S2 normal. No murmur heard.   No gallop.  Pulmonary:     Effort: Pulmonary effort is normal. No respiratory distress.     Breath sounds: Normal breath sounds. No wheezing, rhonchi or rales.  Abdominal:     General: Bowel sounds are normal. There is no distension or abdominal bruit.     Palpations: Abdomen is soft. There is no fluid wave or mass.     Tenderness: There is no abdominal tenderness. There is no guarding or rebound.     Hernia: No hernia is present.  Genitourinary:    Exam position: Supine.     Labia:        Right: No rash, tenderness or lesion.        Left: No rash, tenderness or lesion.      Vagina: Normal.     Cervix: No cervical motion tenderness, discharge or friability.     Uterus: Not enlarged and not tender.      Adnexa:        Right: No mass, tenderness or fullness.         Left: No mass, tenderness or fullness.    Musculoskeletal:     Cervical back: Normal range of motion and neck supple.  Lymphadenopathy:     Cervical: No cervical adenopathy.  Skin:    General: Skin is warm and dry.     Findings: No rash.  Neurological:     Mental Status: She is alert.     Cranial Nerves: No cranial nerve deficit.     Sensory: No sensory deficit.  Psychiatric:        Mood and Affect: Mood is not anxious or depressed.        Speech:  Speech normal.        Behavior: Behavior normal. Behavior is cooperative.        Judgment: Judgment normal.  Results for orders placed or performed in visit on 04/18/21  Hemoglobin A1c  Result Value Ref Range   Hgb A1c MFr Bld 5.4 4.6 - 6.5 %  Comprehensive metabolic panel  Result Value Ref Range   Sodium 138 135 - 145 mEq/L   Potassium 3.4 (L) 3.5 - 5.1 mEq/L   Chloride 100 96 - 112 mEq/L   CO2 24 19 - 32 mEq/L   Glucose, Bld 85 70 - 99 mg/dL   BUN 7 6 - 23 mg/dL   Creatinine, Ser 4.50 0.40 - 1.20 mg/dL   Total Bilirubin 0.5 0.2 - 1.2 mg/dL   Alkaline Phosphatase 52 39 - 117 U/L   AST 32 0 - 37 U/L   ALT 23 0 - 35 U/L   Total Protein 7.0 6.0 - 8.3 g/dL   Albumin 4.2 3.5 - 5.2 g/dL   GFR 388.82 >80.03 mL/min   Calcium 8.6 8.4 - 10.5 mg/dL  Lipid panel  Result Value Ref Range   Cholesterol 147 0 - 200 mg/dL   Triglycerides 491.7 0.0 - 149.0 mg/dL   HDL 91.50 >56.97 mg/dL   VLDL 94.8 0.0 - 01.6 mg/dL   LDL Cholesterol 45 0 - 99 mg/dL   Total CHOL/HDL Ratio 2    NonHDL 73.96     This visit occurred during the SARS-CoV-2 public health emergency.  Safety protocols were in place, including screening questions prior to the visit, additional usage of staff PPE, and extensive cleaning of exam room while observing appropriate contact time as indicated for disinfecting solutions.   COVID 19 screen:  No recent travel or known exposure to COVID19 The patient denies respiratory symptoms of COVID 19 at this time. The importance of social distancing was discussed today.   Assessment and Plan The patient's preventative maintenance and recommended screening tests for an annual wellness exam were reviewed in full today. Brought up to date unless services declined.  Counselled on the importance of diet, exercise, and its role in overall health and mortality. The patient's FH and SH was reviewed, including their home life, tobacco status, and drug and alcohol status.   Vaccines:  Given flu 03/2021 and  uptodateTdap . S/P COVID x 3 Pelvic: DVE/pap; last pap 2017 nml, no high risk HPV, repeat in 5 years. No early family history of colon, uterine , ovarian or breast cancer.  Nonsmoker HIV/STD: refused.   Hep C: pending  Problem List Items Addressed This Visit     GAD (generalized anxiety disorder)    Improved control with job change!      HTN (hypertension), benign    Stable, chronic.  Continue current medication.    losartan 100 mg daily  amlodipine 10 mg daily      MDD (major depressive disorder), recurrent episode, moderate (HCC)     Improved control with job change.  Continue fluoxetine 40 mg daily.      Prediabetes    Encouraged exercise, weight loss, healthy eating habits.  A1C now in normal range.      Other Visit Diagnoses     Routine general medical examination at a health care facility    -  Primary        [name], MD

## 2021-04-18 NOTE — Assessment & Plan Note (Signed)
Improved control with job change.  Continue fluoxetine 40 mg daily.

## 2021-04-18 NOTE — Telephone Encounter (Signed)
-----   Message from Alvina Chou sent at 04/14/2021  8:09 AM EST ----- Regarding: Lab orders for Friday,  12.9.22 Patient is scheduled for CPX labs, please order future labs, Thanks , Camelia Eng

## 2021-04-18 NOTE — Assessment & Plan Note (Signed)
Improved control with job change!

## 2021-04-18 NOTE — Addendum Note (Signed)
Addended by: Damita Lack on: 04/18/2021 04:01 PM   Modules accepted: Orders

## 2021-04-18 NOTE — Assessment & Plan Note (Signed)
Encouraged exercise, weight loss, healthy eating habits.  A1C now in normal range.

## 2021-04-21 LAB — HEPATITIS C ANTIBODY
Hepatitis C Ab: NONREACTIVE
SIGNAL TO CUT-OFF: 0.03 (ref <1.00)

## 2021-04-22 ENCOUNTER — Other Ambulatory Visit: Payer: Self-pay | Admitting: Family Medicine

## 2021-04-23 LAB — CYTOLOGY - PAP
Adequacy: ABSENT
Comment: NEGATIVE
Diagnosis: NEGATIVE
High risk HPV: NEGATIVE

## 2021-05-21 ENCOUNTER — Encounter: Payer: Self-pay | Admitting: Family Medicine

## 2021-05-21 MED ORDER — POTASSIUM CHLORIDE CRYS ER 20 MEQ PO TBCR: EXTENDED_RELEASE_TABLET | ORAL | 1 refills | Status: DC

## 2021-06-11 ENCOUNTER — Other Ambulatory Visit: Payer: Self-pay | Admitting: Family Medicine

## 2021-06-11 ENCOUNTER — Encounter: Payer: Self-pay | Admitting: *Deleted

## 2021-06-11 NOTE — Telephone Encounter (Signed)
Last office visit 04/18/2021.  Last refilled 12/01/2020 for #30 with no refills.  At last visit note states:   HTN (hypertension), benign      Stable, chronic.  Continue current medication.      losartan 100 mg daily  amlodipine 10 mg daily    No future appointments with PCP.  Refill Metoprolol?

## 2021-06-12 NOTE — Telephone Encounter (Signed)
I believe there is an error in that note. She is on metoprolol. Refilled.

## 2021-06-17 ENCOUNTER — Other Ambulatory Visit: Payer: Self-pay | Admitting: Family Medicine

## 2021-06-20 DIAGNOSIS — H6120 Impacted cerumen, unspecified ear: Secondary | ICD-10-CM | POA: Diagnosis not present

## 2021-08-11 ENCOUNTER — Other Ambulatory Visit: Payer: Self-pay | Admitting: Family Medicine

## 2021-08-13 ENCOUNTER — Encounter: Payer: Self-pay | Admitting: Family Medicine

## 2021-09-04 ENCOUNTER — Other Ambulatory Visit: Payer: Self-pay | Admitting: Family Medicine

## 2021-09-16 ENCOUNTER — Encounter: Payer: Self-pay | Admitting: Family Medicine

## 2021-09-17 MED ORDER — FLUOXETINE HCL 40 MG PO CAPS: 40.0000 mg | ORAL_CAPSULE | Freq: Every day | ORAL | 0 refills | Status: DC

## 2021-09-25 ENCOUNTER — Encounter: Payer: Self-pay | Admitting: Family Medicine

## 2021-10-05 ENCOUNTER — Encounter: Payer: Self-pay | Admitting: Family Medicine

## 2021-10-17 ENCOUNTER — Ambulatory Visit: Payer: BC Managed Care – PPO | Admitting: Family Medicine

## 2021-10-24 ENCOUNTER — Ambulatory Visit (INDEPENDENT_AMBULATORY_CARE_PROVIDER_SITE_OTHER): Payer: 59 | Admitting: Family Medicine

## 2021-10-24 VITALS — BP 116/64 | HR 90 | Temp 98.3°F | Resp 16 | Ht 63.25 in | Wt 147.3 lb

## 2021-10-24 DIAGNOSIS — R69 Illness, unspecified: Secondary | ICD-10-CM | POA: Diagnosis not present

## 2021-10-24 DIAGNOSIS — F410 Panic disorder [episodic paroxysmal anxiety] without agoraphobia: Secondary | ICD-10-CM

## 2021-10-24 DIAGNOSIS — F331 Major depressive disorder, recurrent, moderate: Secondary | ICD-10-CM | POA: Diagnosis not present

## 2021-10-24 DIAGNOSIS — F411 Generalized anxiety disorder: Secondary | ICD-10-CM

## 2021-10-24 MED ORDER — FLUOXETINE HCL 40 MG PO CAPS: 40.0000 mg | ORAL_CAPSULE | Freq: Every day | ORAL | 1 refills | Status: DC

## 2021-10-24 NOTE — Assessment & Plan Note (Signed)
Chronic, well controlled   Continue fluoxetine 40 mg p.o. daily

## 2021-10-24 NOTE — Assessment & Plan Note (Signed)
No panic attacks in the last few months.  She is rarely required Atarax as needed.

## 2021-10-24 NOTE — Progress Notes (Signed)
Patient ID: DAWT REEB, female    DOB: [birth year], 36 y.o.   MRN: [id number]  This visit was conducted in person.  BP 116/64   Pulse 90   Temp 98.3 F (36.8 C)   Resp 16   Ht 5' 3.25" (1.607 m)   Wt 147 lb 5 oz (66.8 kg)   SpO2 99%   BMI 25.89 kg/m    CC:  Chief Complaint  Patient presents with   Anxiety   Depression       Subjective:   HPI: Angela [name] is a 36 y.o. female presenting on 10/24/2021 for Anxiety and Depression  6 month follow up MDD, GAD:   She is having  good control of bnher mood. She is able to overcome anxiety faster and easier.  Doing behavioral treatment.   Panic attacks are not occurring, daytime anxiety is less often.  NO SE fluoxetine 40 mg daily.  Has not needed atarax prn.    10/24/2021    4:08 PM 01/28/2021    4:33 PM 01/14/2021    9:48 AM  Depression screen PHQ 2/9  Decreased Interest 0 1 2  Down, Depressed, Hopeless 1 0 1  PHQ - 2 Score 1 1 3   Altered sleeping 1 2 3   Tired, decreased energy 1 1   Change in appetite 1 1 2   Feeling bad or failure about yourself  0 0 1  Trouble concentrating 1 2 3   Moving slowly or fidgety/restless 0 0 0  Suicidal thoughts 0 0 0  PHQ-9 Score 5 7 12   Difficult doing work/chores Somewhat difficult Somewhat difficult Very difficult   Body mass index is 25.89 kg/m.     10/24/2021    4:36 PM 01/28/2021    4:31 PM 01/14/2021    9:52 AM 11/05/2020   12:00 PM  GAD 7 : Generalized Anxiety Score  Nervous, Anxious, on Edge 1 1 1 1   Control/stop worrying 0 1 1 1   Worry too much - different things 0 1 1 1   Trouble relaxing 0 1 1 1   Restless 0 0 1 0  Easily annoyed or irritable 2 2 2 2   Afraid - awful might happen 1 1 1 1   Total GAD 7 Score 4 7 8 7   Anxiety Difficulty Somewhat difficult Somewhat difficult Very difficult Somewhat difficult           She has been working on increase exercise and walking Wt Readings from Last 3 Encounters:  10/24/21 147 lb 5 oz (66.8 kg)  04/18/21  160 lb 2 oz (72.6 kg)  01/28/21 158 lb (71.7 kg)         Relevant past medical, surgical, family and social history reviewed and updated as indicated. Interim medical history since our last visit reviewed. Allergies and medications reviewed and updated. Outpatient Medications Prior to Visit  Medication Sig Dispense Refill   amLODipine (NORVASC) 10 MG tablet TAKE 1 TABLET BY MOUTH EVERY DAY 90 tablet 1   cyclobenzaprine (FLEXERIL) 10 MG tablet TAKE 1 TABLET (10 MG TOTAL) BY MOUTH AT BEDTIME AS NEEDED FOR MUSCLE SPASMS. 15 tablet 0   FLUoxetine (PROZAC) 40 MG capsule Take 1 capsule (40 mg total) by mouth daily. 90 capsule 0   Galcanezumab-gnlm (EMGALITY) 120 MG/ML SOAJ 1INJECT 120MG  INTO THE SKIN EVERY 30 DAYS 3 mL 3   hydrOXYzine (ATARAX/VISTARIL) 10 MG tablet Take 1 tablet (10 mg total) by mouth 3 (three) times daily as needed for anxiety. 30 tablet 0  losartan (COZAAR) 100 MG tablet TAKE 1 TABLET BY MOUTH EVERY DAY 90 tablet 1   metoprolol succinate (TOPROL-XL) 25 MG 24 hr tablet TAKE 1 TABLET (25 MG TOTAL) BY MOUTH DAILY. 90 tablet 3   pantoprazole (PROTONIX) 40 MG tablet Take 1 tablet (40 mg total) by mouth daily. 90 tablet 1   potassium chloride SA (KLOR-CON M20) 20 MEQ tablet TAKE 1 TABLET EVERY SUNDAY, MONDAY, WEDNESDAY AND FRIDAY 48 tablet 1   promethazine (PHENERGAN) 25 MG tablet TAKE 1 TABLET (25 MG TOTAL) BY MOUTH EVERY 6 (SIX) HOURS AS NEEDED FOR NAUSEA OR VOMITING. 30 tablet 0   tolterodine (DETROL LA) 4 MG 24 hr capsule TAKE ONE CAPSULE BY MOUTH DAILY 90 capsule 1   valACYclovir (VALTREX) 1000 MG tablet Take 1 tablet (1,000 mg total) by mouth 3 (three) times daily. 21 tablet 0   No facility-administered medications prior to visit.     Per HPI unless specifically indicated in ROS section below Review of Systems  Constitutional:  Negative for fatigue and fever.  HENT:  Negative for ear pain.   Eyes:  Negative for pain.  Respiratory:  Negative for chest tightness and  shortness of breath.   Cardiovascular:  Negative for chest pain, palpitations and leg swelling.  Gastrointestinal:  Negative for abdominal pain.  Genitourinary:  Negative for dysuria.   Objective:  BP 116/64   Pulse 90   Temp 98.3 F (36.8 C)   Resp 16   Ht 5' 3.25" (1.607 m)   Wt 147 lb 5 oz (66.8 kg)   SpO2 99%   BMI 25.89 kg/m   Wt Readings from Last 3 Encounters:  10/24/21 147 lb 5 oz (66.8 kg)  04/18/21 160 lb 2 oz (72.6 kg)  01/28/21 158 lb (71.7 kg)      Physical Exam Constitutional:      General: She is not in acute distress.    Appearance: Normal appearance. She is well-developed. She is not ill-appearing or toxic-appearing.  HENT:     Head: Normocephalic.     Right Ear: Hearing, tympanic membrane, ear canal and external ear normal. Tympanic membrane is not erythematous, retracted or bulging.     Left Ear: Hearing, tympanic membrane, ear canal and external ear normal. Tympanic membrane is not erythematous, retracted or bulging.     Nose: No mucosal edema or rhinorrhea.     Right Sinus: No maxillary sinus tenderness or frontal sinus tenderness.     Left Sinus: No maxillary sinus tenderness or frontal sinus tenderness.     Mouth/Throat:     Pharynx: Uvula midline.  Eyes:     General: Lids are normal. Lids are everted, no foreign bodies appreciated.     Conjunctiva/sclera: Conjunctivae normal.     Pupils: Pupils are equal, round, and reactive to light.  Neck:     Thyroid: No thyroid mass or thyromegaly.     Vascular: No carotid bruit.     Trachea: Trachea normal.  Cardiovascular:     Rate and Rhythm: Normal rate and regular rhythm.     Pulses: Normal pulses.     Heart sounds: Normal heart sounds, S1 normal and S2 normal. No murmur heard.    No friction rub. No gallop.  Pulmonary:     Effort: Pulmonary effort is normal. No tachypnea or respiratory distress.     Breath sounds: Normal breath sounds. No decreased breath sounds, wheezing, rhonchi or rales.   Abdominal:     General: Bowel sounds are normal.  Palpations: Abdomen is soft.     Tenderness: There is no abdominal tenderness.  Musculoskeletal:     Cervical back: Normal range of motion and neck supple.  Skin:    General: Skin is warm and dry.     Findings: No rash.  Neurological:     Mental Status: She is alert.  Psychiatric:        Mood and Affect: Mood is not anxious or depressed.        Speech: Speech normal.        Behavior: Behavior normal. Behavior is cooperative.        Thought Content: Thought content normal.        Judgment: Judgment normal.       Results for orders placed or performed in visit on 04/18/21  Cytology - PAP([place])  Result Value Ref Range   High risk HPV Negative    Adequacy      Satisfactory for evaluation; transformation zone component ABSENT.   Diagnosis      - Negative for intraepithelial lesion or malignancy (NILM)   Comment Normal Reference Range HPV - Negative      COVID 19 screen:  No recent travel or known exposure to COVID19 The patient denies respiratory symptoms of COVID 19 at this time. The importance of social distancing was discussed today.   Assessment and Plan Problem List Items Addressed This Visit     GAD (generalized anxiety disorder)    Chronic, well controlled   Continue fluoxetine 40 mg p.o. daily      Relevant Medications   FLUoxetine (PROZAC) 40 MG capsule   MDD (major depressive disorder), recurrent episode, moderate (HCC) - Primary    Chronic, well controlled  She reports working on healthy lifestyle has helped her mood along with continuing fluoxetine 40 mg daily.  She wishes to continue this current dose as she is having no side effects and it is working well.      Relevant Medications   FLUoxetine (PROZAC) 40 MG capsule   Panic attacks    No panic attacks in the last few months.  She is rarely required Atarax as needed.      Relevant Medications   FLUoxetine (PROZAC) 40 MG capsule        [name], MD

## 2021-10-24 NOTE — Assessment & Plan Note (Signed)
Chronic, well controlled  She reports working on healthy lifestyle has helped her mood along with continuing fluoxetine 40 mg daily.  She wishes to continue this current dose as she is having no side effects and it is working well.

## 2021-10-24 NOTE — Patient Instructions (Signed)
Continue current medications and keep working on healthy lifestyle.  Call if BPs running low < 90/60 or dizziness.. we may be able to decrease the losartan to 50 mg daily.

## 2021-11-01 ENCOUNTER — Other Ambulatory Visit: Payer: Self-pay | Admitting: Family Medicine

## 2021-11-09 ENCOUNTER — Other Ambulatory Visit: Payer: Self-pay | Admitting: Family Medicine

## 2021-11-19 ENCOUNTER — Encounter: Payer: Self-pay | Admitting: Family Medicine

## 2021-11-19 MED ORDER — PANTOPRAZOLE SODIUM 40 MG PO TBEC: 40.0000 mg | DELAYED_RELEASE_TABLET | Freq: Every day | ORAL | 1 refills | Status: DC

## 2021-11-19 MED ORDER — TOLTERODINE TARTRATE ER 4 MG PO CP24: 4.0000 mg | ORAL_CAPSULE | Freq: Every day | ORAL | 1 refills | Status: DC

## 2021-11-21 DIAGNOSIS — N898 Other specified noninflammatory disorders of vagina: Secondary | ICD-10-CM | POA: Diagnosis not present

## 2021-12-02 NOTE — Patient Instructions (Signed)
Below is our plan:  We will continue Emglality monthly and phenergan as needed.   Please make sure you are staying well hydrated. I recommend 50-60 ounces daily. Well balanced diet and regular exercise encouraged. Consistent sleep schedule with 6-8 hours recommended.   Please continue follow up with care team as directed.   Follow up with me in 1 year   You may receive a survey regarding today's visit. I encourage you to leave honest feed back as I do use this information to improve patient care. Thank you for seeing me today!

## 2021-12-02 NOTE — Progress Notes (Unsigned)
PATIENT: Angela [name] DOB: [birth year]  REASON FOR VISIT: follow up HISTORY FROM: patient  No chief complaint on file.     HISTORY OF PRESENT ILLNESS:  12/02/21 ALL:  [name] returns for follow up. She continues [employer].   12/03/2020 ALL: [name] returns for follow up for migraines. She continues Emgality and tolerating well. She does have more headaches just prior to next injection. She has a headache band she uses in combination with resting in a dark room for abortive therapy that works well. She knows stress is most common trigger for her. She is feeling well, today.   12/04/2019 ALL:  Angela [name] is a 36 y.o. female here today for follow up for migraines. She continues Emgality monthly. She feels that she is doing well. She does note increase in headache frequency the week prior to next injection. She is able to use complementary therapies to abort headache. She is unable to tolerate triptan medications. She rarely uses phenergan.   HISTORY: (copied from my note on 09/27/2018)  Angela [name] is a 36 y.o. female for follow up of migraines. She is doing well on Emgality. She reports that she has about 2-3 migraines per month. She feels that migraines return the week prior to [facility] dose. She does not use abortive therapy. She prefers compression and relaxation. She does have sound and light sensitivity with migraines and also has nausea and vomiting when they are severe.      History (copied from Dr Trevor Mace note on 09/24/2017)   Her headaches are much improved.  2-3 migraines a month baseline was > 15. She is thrilled with Ajovy. No side effects. Will continue. Discussed other options and the other cgrp meds, she is doing so well wont change a thing. Discussed acute management as well.    Interval history: Discussion today with patient regarding Botox versus medications versus the new CGRP medications.  Patient would like to try the new CGRP medications.  Provided sample today of Ajovy.   HPI:  Angela [name] is a 36 y.o. female here as a referral from Dr. Ermalene Searing for migraines. Past medical history of migraines, hypertension, anemia. Sister with migraines. Headaches worsening and started becoming very intense with nausea, vomiting, dizziness, blurry vision. Started in April with 2 weeks of hedaches. No inciting events or head trauma. There was stress at work however. Starts in the temples and behind the eys, can be unilateral and also in the back, pounding she can feel her veins and eyes about to pop out. Light and sound bothers her. She has nausea and vomiting. She has ear pain with the headaches but ear exam normal. She sees floaters sometime but no aura. She has 25 headache days a month, 15 are migrainous and can last up to 24 hours. She sometimes has morning headaches. No other focal neurologic deficits, associated symptoms, inciting events or modifiable factors. She was prescribed topiramate but never picked it up bc she read the side effects. She hs her fallopian tubes removed, no more children. No other focal neurologic deficits, associated symptoms, inciting events or modifiable factors.   Meds tried: Imitrex (made her feel weird), flexeril, Labetalol and Topiramate    Reviewed notes, labs and imaging from outside physicians, which showed:    hgba1c 5.6, CMP nml 09/2016   Reviewed notes, patient has been to primary care multiple times and a migraines. Treated with Zofran. She reported using ibuprofen and Imitrex. Acute headaches resolved with Imitrex. She reported  headaches every other day in April of this year. Feels a band around her head that is squeezing, associated with nausea, blurred vision and dizziness, photo and phonophobia associated, also noted ear pressure. Triggers include increase in stress. Exam noted that there was no papilledema. She was started on Topamax at bedtime. She did not fill this given fear of side effects. She is  using Imitrex for severe headaches and using ibuprofen as well as. The month with headaches usually 1-2 hours but some up to 6-12 hours.   REVIEW OF SYSTEMS: Out of a complete 14 system review of symptoms, the patient complains only of the following symptoms, headaches and all other reviewed systems are negative.  ALLERGIES: Allergies  Allergen Reactions   Amoxicillin Hives    Has patient had a PCN reaction causing immediate rash, facial/tongue/throat swelling, SOB or lightheadedness with hypotension: No Has patient had a PCN reaction causing severe rash involving mucus membranes or skin necrosis: Yes Has patient had a PCN reaction that required hospitalization No Has patient had a PCN reaction occurring within the last 10 years: No If all of the above answers are "NO", then may proceed with Cephalosporin use.    Dexilant [Dexlansoprazole] Nausea Only and Other (See Comments)    dizziness    HOME MEDICATIONS: Outpatient Medications Prior to Visit  Medication Sig Dispense Refill   amLODipine (NORVASC) 10 MG tablet TAKE 1 TABLET BY MOUTH EVERY DAY 90 tablet 1   cyclobenzaprine (FLEXERIL) 10 MG tablet TAKE 1 TABLET (10 MG TOTAL) BY MOUTH AT BEDTIME AS NEEDED FOR MUSCLE SPASMS. 15 tablet 0   FLUoxetine (PROZAC) 40 MG capsule Take 1 capsule (40 mg total) by mouth daily. 90 capsule 1   Galcanezumab-gnlm (EMGALITY) 120 MG/ML SOAJ 1INJECT 120MG  INTO THE SKIN EVERY 30 DAYS 3 mL 3   hydrOXYzine (ATARAX/VISTARIL) 10 MG tablet Take 1 tablet (10 mg total) by mouth 3 (three) times daily as needed for anxiety. 30 tablet 0   losartan (COZAAR) 100 MG tablet TAKE 1 TABLET BY MOUTH EVERY DAY 90 tablet 1   metoprolol succinate (TOPROL-XL) 25 MG 24 hr tablet TAKE 1 TABLET (25 MG TOTAL) BY MOUTH DAILY. 90 tablet 3   pantoprazole (PROTONIX) 40 MG tablet Take 1 tablet (40 mg total) by mouth daily. 90 tablet 1   potassium chloride SA (KLOR-CON M20) 20 MEQ tablet TAKE 1 TABLET EVERY SUNDAY, MONDAY, WEDNESDAY AND  FRIDAY 48 tablet 1   promethazine (PHENERGAN) 25 MG tablet TAKE 1 TABLET (25 MG TOTAL) BY MOUTH EVERY 6 (SIX) HOURS AS NEEDED FOR NAUSEA OR VOMITING. 30 tablet 0   tolterodine (DETROL LA) 4 MG 24 hr capsule Take 1 capsule (4 mg total) by mouth daily. 90 capsule 1   valACYclovir (VALTREX) 1000 MG tablet Take 1 tablet (1,000 mg total) by mouth 3 (three) times daily. 21 tablet 0   No facility-administered medications prior to visit.    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   Infection    BV   Migraines  Ovarian cyst rupture    PONV (postoperative nausea and vomiting)    Preterm labor    Urinary incontinence     PAST SURGICAL HISTORY: Past Surgical History:  Procedure Laterality Date   CERVIX LESION DESTRUCTION     CESAREAN SECTION  2005, 2010   x2   CESAREAN SECTION WITH BILATERAL TUBAL LIGATION Bilateral 11/15/2012   Procedure: REPEAT CESAREAN SECTION WITH BILATERAL TUBAL LIGATION;  Surgeon: Kirkland Hun, MD;  Location: [facility];  Service: Obstetrics;  Laterality: Bilateral;   COLPOSCOPY     UNILATERAL SALPINGECTOMY Right 11/15/2012   Procedure: UNILATERAL SALPINGECTOMY;  Surgeon: Kirkland Hun, MD;  Location: [facility];  Service: Obstetrics;  Laterality: Right;   WISDOM TOOTH EXTRACTION      FAMILY HISTORY: Family History  Problem Relation Age of Onset   Anesthesia problems Mother        NAUSEA AND VOMITING   Thyroid disease Mother    Fibromyalgia Mother    Sarcoidosis Mother    Depression Mother    Other Mother        VARICOSE VEINS   Anesthesia problems Other    Diabetes Father    Hypertension Sister     Arthritis Maternal Grandmother    Heart disease Maternal Grandmother    Hypertension Maternal Grandmother    Kidney disease Maternal Grandmother        FAILURE   Prostate cancer Maternal Grandfather    Depression Maternal Aunt    Sarcoidosis Maternal Aunt    Asthma Cousin    Liver disease Cousin        requiring liver transplant- unknown as to what type of liver disease   Colon cancer Neg Hx    Esophageal cancer Neg Hx    Pancreatic cancer Neg Hx    Stomach cancer Neg Hx    Inflammatory bowel disease Neg Hx    Rectal cancer Neg Hx     SOCIAL HISTORY: Social History   Socioeconomic History   Marital status: Married    Spouse name: HASAN   Number of children: 2   Years of education: 14+   Highest education level: Not on file  Occupational History   Occupation: Retail banker: [employer]  Tobacco Use   Smoking status: Never   Smokeless tobacco: Never  Vaping Use   Vaping Use: Never used  Substance and Sexual Activity   Alcohol use: Yes    Alcohol/week: 3.0 standard drinks of alcohol    Types: 2 Glasses of wine, 1 Shots of liquor per week    Comment: weekends   Drug use: No   Sexual activity: Yes    Partners: Male    Birth control/protection: Other-see comments    Comment: Ablation  Other Topics Concern   Not on file  Social History Narrative   Lives at home w/ her husband and children   Right-handed   Caffeine: 1 cup coffee daily   Social Determinants of Health   Financial Resource Strain: Not on file  Food Insecurity: Not on file  Transportation Needs: Not on file  Physical Activity: Not on file  Stress: Not on file  Social Connections: Not on file  Intimate Partner Violence: Not on file      PHYSICAL EXAM  There were no vitals filed for this visit.   There is no height or weight on file to calculate BMI.  Generalized: Well developed, in no acute distress  Cardiology: normal rate and rhythm, no murmur noted Respiratory:  clear to auscultation bilaterally  Neurological examination  Mentation: Alert oriented to time, place, history taking. Follows all commands speech and language fluent Cranial nerve II-XII: Pupils were equal round reactive to light. Extraocular movements were full, visual field were full  Motor: The motor testing reveals 5 over 5 strength of all 4 extremities. Good symmetric motor tone is noted throughout.   Gait and station: Gait is normal.   DIAGNOSTIC DATA (LABS, IMAGING, TESTING) - I reviewed patient records, labs, notes, testing and imaging myself where available.      No data to display           Lab Results  Component Value Date   WBC 7.5 02/02/2020   HGB 12.4 02/02/2020   HCT 38.0 02/02/2020   MCV 94 02/02/2020   PLT 355 02/02/2020      Component Value Date/Time   NA 138 04/18/2021 0857   NA 136 02/02/2020 0848   K 3.4 (L) 04/18/2021 0857   CL 100 04/18/2021 0857   CO2 24 04/18/2021 0857   GLUCOSE 85 04/18/2021 0857   BUN 7 04/18/2021 0857   BUN 12 02/02/2020 0848   CREATININE 0.66 04/18/2021 0857   CALCIUM 8.6 04/18/2021 0857   PROT 7.0 04/18/2021 0857   PROT 7.5 02/02/2020 0848   ALBUMIN 4.2 04/18/2021 0857   ALBUMIN 4.7 02/02/2020 0848   AST 32 04/18/2021 0857   ALT 23 04/18/2021 0857   ALKPHOS 52 04/18/2021 0857   BILITOT 0.5 04/18/2021 0857   BILITOT 0.4 02/02/2020 0848   GFRNONAA 100 02/02/2020 0848   GFRAA 116 02/02/2020 0848   Lab Results  Component Value Date   CHOL 147 04/18/2021   HDL 73.10 04/18/2021   LDLCALC 45 04/18/2021   TRIG 145.0 04/18/2021   CHOLHDL 2 04/18/2021   Lab Results  Component Value Date   HGBA1C 5.4 04/18/2021   No results found for: "VITAMINB12" Lab Results  Component Value Date   TSH 3.182 10/12/2018       ASSESSMENT AND PLAN 36 y.o. year old female  has a past medical history of Abnormal Pap smear (AGE 29), Allergy, Anemia, Complication of anesthesia, Genital herpes, GERD (gastroesophageal reflux disease)  (AGE 23), History of chicken pox, Hypertension, Infection (AGE 51), Infection (2007), Infection, Migraines, Ovarian cyst rupture, PONV (postoperative nausea and vomiting), Preterm labor, and Urinary incontinence. here with   No diagnosis found.    Angela [name] is doing well on Emgality every 30 days. Migraines are well managed and easily aborted with complementary therapies. She will continue current treatment plan. We will consider adding Nurtec or Ubrelvy if needed in the future. She was encouraged to continue healthy lifestyle habits. Follow up in 1 year, sooner if needed. She verbalizes understanding and agreement with this plan.    No orders of the defined types were placed in this encounter.     No orders of the defined types were placed in this encounter.    Debbora Presto, FNP-C 12/02/2021, 4:43 PM [facility] [address] [place], [place] [address] [phone]

## 2021-12-03 ENCOUNTER — Ambulatory Visit (INDEPENDENT_AMBULATORY_CARE_PROVIDER_SITE_OTHER): Payer: 59 | Admitting: Family Medicine

## 2021-12-03 ENCOUNTER — Encounter: Payer: Self-pay | Admitting: Family Medicine

## 2021-12-03 VITALS — BP 117/80 | HR 76 | Ht 63.25 in | Wt 144.4 lb

## 2021-12-03 DIAGNOSIS — R112 Nausea with vomiting, unspecified: Secondary | ICD-10-CM | POA: Diagnosis not present

## 2021-12-03 DIAGNOSIS — G43709 Chronic migraine without aura, not intractable, without status migrainosus: Secondary | ICD-10-CM

## 2021-12-03 MED ORDER — PROMETHAZINE HCL 25 MG PO TABS: 25.0000 mg | ORAL_TABLET | Freq: Four times a day (QID) | ORAL | 11 refills | Status: DC | PRN

## 2021-12-03 MED ORDER — EMGALITY 120 MG/ML ~~LOC~~ SOAJ: SUBCUTANEOUS | 3 refills | Status: DC

## 2021-12-07 ENCOUNTER — Other Ambulatory Visit: Payer: Self-pay | Admitting: Family Medicine

## 2021-12-08 ENCOUNTER — Encounter: Payer: Self-pay | Admitting: Family Medicine

## 2021-12-13 ENCOUNTER — Other Ambulatory Visit: Payer: Self-pay | Admitting: Family Medicine

## 2022-01-03 DIAGNOSIS — R69 Illness, unspecified: Secondary | ICD-10-CM | POA: Diagnosis not present

## 2022-02-14 ENCOUNTER — Other Ambulatory Visit: Payer: Self-pay | Admitting: Family Medicine

## 2022-02-26 ENCOUNTER — Ambulatory Visit (INDEPENDENT_AMBULATORY_CARE_PROVIDER_SITE_OTHER): Payer: 59 | Admitting: Family Medicine

## 2022-02-26 VITALS — BP 122/76 | HR 94 | Temp 98.2°F | Resp 16 | Ht 63.25 in | Wt 140.5 lb

## 2022-02-26 DIAGNOSIS — N898 Other specified noninflammatory disorders of vagina: Secondary | ICD-10-CM | POA: Diagnosis not present

## 2022-02-26 NOTE — Progress Notes (Signed)
Patient ID: Angela [name], female    DOB: 1985/11/02, 36 y.o.   MRN: [id number]  This visit was conducted in person.  BP 122/76   Pulse 94   Temp 98.2 F (36.8 C)   Resp 16   Ht 5' 3.25" (1.607 m)   Wt 140 lb 8 oz (63.7 kg)   SpO2 98%   BMI 24.69 kg/m    CC:  Chief Complaint  Patient presents with   Vaginitis    Subjective:   HPI: Angela [name] is a 36 y.o. female presenting on 02/26/2022 for Vaginitis  Hx of  Vaginitis Last OV from 01/02/2022 noted.. Dx with BV.. treated with oral medication... was followed by yeast infection.. fluconazole   Resolved completely.   Symptoms started back 1 week ago: noted vaginal discharge, clear milky, no odor  No vaginal itching.  No dysuria, no abd pain..   No new sexually partners. Has had sex in last month.   Wt Readings from Last 3 Encounters:  02/26/22 140 lb 8 oz (63.7 kg)  12/03/21 144 lb 6.4 oz (65.5 kg)  10/24/21 147 lb 5 oz (66.8 kg)       Relevant past medical, surgical, family and social history reviewed and updated as indicated. Interim medical history since our last visit reviewed. Allergies and medications reviewed and updated. Outpatient Medications Prior to Visit  Medication Sig Dispense Refill   amLODipine (NORVASC) 10 MG tablet TAKE 1 TABLET BY MOUTH EVERY DAY 90 tablet 1   cyclobenzaprine (FLEXERIL) 10 MG tablet TAKE 1 TABLET (10 MG TOTAL) BY MOUTH AT BEDTIME AS NEEDED FOR MUSCLE SPASMS. 15 tablet 0   FLUoxetine (PROZAC) 40 MG capsule Take 1 capsule (40 mg total) by mouth daily. 90 capsule 1   Galcanezumab-gnlm (EMGALITY) 120 MG/ML SOAJ 1INJECT 120MG  INTO THE SKIN EVERY 30 DAYS 3 mL 3   hydrOXYzine (ATARAX/VISTARIL) 10 MG tablet Take 1 tablet (10 mg total) by mouth 3 (three) times daily as needed for anxiety. 30 tablet 0   losartan (COZAAR) 100 MG tablet TAKE 1 TABLET BY MOUTH EVERY DAY 90 tablet 1   metoprolol succinate (TOPROL-XL) 25 MG 24 hr tablet TAKE 1 TABLET (25 MG TOTAL) BY MOUTH  DAILY. 90 tablet 3   pantoprazole (PROTONIX) 40 MG tablet Take 1 tablet (40 mg total) by mouth daily. 90 tablet 1   potassium chloride SA (KLOR-CON M20) 20 MEQ tablet TAKE 1 TABLET EVERY SUNDAY, MONDAY, WEDNESDAY AND FRIDAY 48 tablet 1   promethazine (PHENERGAN) 25 MG tablet Take 1 tablet (25 mg total) by mouth every 6 (six) hours as needed for nausea or vomiting. 30 tablet 11   tolterodine (DETROL LA) 4 MG 24 hr capsule TAKE ONE CAPSULE BY MOUTH DAILY 90 capsule 0   valACYclovir (VALTREX) 1000 MG tablet Take 1 tablet (1,000 mg total) by mouth 3 (three) times daily. 21 tablet 0   No facility-administered medications prior to visit.     Per HPI unless specifically indicated in ROS section below Review of Systems  Constitutional:  Negative for fatigue and fever.  HENT:  Negative for ear pain.   Eyes:  Negative for pain.  Respiratory:  Negative for chest tightness and shortness of breath.   Cardiovascular:  Negative for chest pain, palpitations and leg swelling.  Gastrointestinal:  Negative for abdominal pain.  Genitourinary:  Negative for dysuria.   Objective:  BP 122/76   Pulse 94   Temp 98.2 F (36.8 C)   Resp 16  Ht 5' 3.25" (1.607 m)   Wt 140 lb 8 oz (63.7 kg)   SpO2 98%   BMI 24.69 kg/m   Wt Readings from Last 3 Encounters:  02/26/22 140 lb 8 oz (63.7 kg)  12/03/21 144 lb 6.4 oz (65.5 kg)  10/24/21 147 lb 5 oz (66.8 kg)      Physical Exam Exam conducted with a chaperone present.  Constitutional:      General: She is not in acute distress.    Appearance: Normal appearance. She is well-developed. She is not ill-appearing or toxic-appearing.  HENT:     Head: Normocephalic.     Right Ear: Hearing normal. Tympanic membrane is not erythematous, retracted or bulging.     Left Ear: Hearing and tympanic membrane normal. Tympanic membrane is not erythematous, retracted or bulging.     Nose: No mucosal edema or rhinorrhea.     Right Sinus: No maxillary sinus tenderness or  frontal sinus tenderness.     Left Sinus: No maxillary sinus tenderness or frontal sinus tenderness.     Mouth/Throat:     Pharynx: Uvula midline.  Eyes:     General: Lids are normal. Lids are everted, no foreign bodies appreciated.  Neck:     Thyroid: No thyroid mass or thyromegaly.     Vascular: No carotid bruit.     Trachea: Trachea normal.  Cardiovascular:     Rate and Rhythm: Normal rate and regular rhythm.     Pulses: Normal pulses.     Heart sounds: Normal heart sounds, S1 normal and S2 normal. No murmur heard.    No friction rub. No gallop.  Pulmonary:     Effort: Pulmonary effort is normal. No tachypnea or respiratory distress.     Breath sounds: Normal breath sounds. No decreased breath sounds, wheezing, rhonchi or rales.  Abdominal:     General: Bowel sounds are normal.     Palpations: Abdomen is soft.     Tenderness: There is no abdominal tenderness.  Genitourinary:    Pubic Area: No rash.      Labia:        Right: No injury.        Left: No injury.      Vagina: Vaginal discharge present. No erythema, tenderness or bleeding.  Skin:    General: Skin is warm and dry.     Findings: No rash.  Neurological:     Mental Status: She is alert.  Psychiatric:        Mood and Affect: Mood is not anxious or depressed.        Speech: Speech normal.        Behavior: Behavior normal. Behavior is cooperative.        Thought Content: Thought content normal.        Judgment: Judgment normal.       Results for orders placed or performed in visit on 04/18/21  Cytology - PAP([place])  Result Value Ref Range   High risk HPV Negative    Adequacy      Satisfactory for evaluation; transformation zone component ABSENT.   Diagnosis      - Negative for intraepithelial lesion or malignancy (NILM)   Comment Normal Reference Range HPV - Negative      COVID 19 screen:  No recent travel or known exposure to COVID19 The patient denies respiratory symptoms of COVID 19 at this  time. The importance of social distancing was discussed today.   Assessment and Plan Problem  List Items Addressed This Visit     Vaginal itching - Primary    Acute, most likely secondary to BV given patient's history of recurrent bacterial vaginosis.  I will send out wet prep to identify.  We will plan to treat with Metro gel to hopefully avoid yeast infection following the medication.      Relevant Orders   WET PREP BY MOLECULAR PROBE     [name], MD

## 2022-02-26 NOTE — Assessment & Plan Note (Signed)
Acute, most likely secondary to BV given patient's history of recurrent bacterial vaginosis.  I will send out wet prep to identify.  We will plan to treat with Metro gel to hopefully avoid yeast infection following the medication.

## 2022-02-26 NOTE — Assessment & Plan Note (Signed)
Acute, most likely secondary to BV given patient's history of recurrent bacterial vaginosis.  I will send out wet prep to identify.  We will plan to treat with Metro gel to hopefully avoid yeast infection following the medication. 

## 2022-02-27 ENCOUNTER — Other Ambulatory Visit: Payer: Self-pay | Admitting: Family Medicine

## 2022-02-27 LAB — WET PREP BY MOLECULAR PROBE
Candida species: NOT DETECTED
MICRO NUMBER:: 14074001
SPECIMEN QUALITY:: ADEQUATE
Trichomonas vaginosis: NOT DETECTED

## 2022-02-27 MED ORDER — METRONIDAZOLE 0.75 % VA GEL: 1.0000 | Freq: Every day | VAGINAL | 0 refills | Status: DC

## 2022-03-10 ENCOUNTER — Encounter: Payer: Self-pay | Admitting: Family Medicine

## 2022-03-11 ENCOUNTER — Other Ambulatory Visit: Payer: Self-pay | Admitting: Family Medicine

## 2022-03-11 MED ORDER — FLUCONAZOLE 150 MG PO TABS: 150.0000 mg | ORAL_TABLET | Freq: Once | ORAL | 0 refills | Status: AC

## 2022-03-18 ENCOUNTER — Encounter: Payer: Self-pay | Admitting: Family Medicine

## 2022-03-18 MED ORDER — AMLODIPINE BESYLATE 10 MG PO TABS: 10.0000 mg | ORAL_TABLET | Freq: Every day | ORAL | 0 refills | Status: DC

## 2022-04-09 ENCOUNTER — Other Ambulatory Visit: Payer: Self-pay | Admitting: Family Medicine

## 2022-04-13 ENCOUNTER — Telehealth: Payer: Self-pay | Admitting: Family Medicine

## 2022-04-13 DIAGNOSIS — I10 Essential (primary) hypertension: Secondary | ICD-10-CM

## 2022-04-13 NOTE — Telephone Encounter (Signed)
-----   Message from Ronalee Red, RT sent at 04/06/2022 10:05 AM EST ----- Regarding: Tue 12/12 lab Patient is scheduled for cpx, please order future labs.  Thanks, Jae Dire

## 2022-04-21 ENCOUNTER — Other Ambulatory Visit (INDEPENDENT_AMBULATORY_CARE_PROVIDER_SITE_OTHER): Payer: 59

## 2022-04-21 DIAGNOSIS — I10 Essential (primary) hypertension: Secondary | ICD-10-CM | POA: Diagnosis not present

## 2022-04-21 LAB — LIPID PANEL
Cholesterol: 157 mg/dL (ref 0–200)
HDL: 89.5 mg/dL (ref >39.00)
LDL Cholesterol: 40 mg/dL (ref 0–99)
NonHDL: 67.47
Total CHOL/HDL Ratio: 2
Triglycerides: 138 mg/dL (ref 0.0–149.0)
VLDL: 27.6 mg/dL (ref 0.0–40.0)

## 2022-04-21 LAB — COMPREHENSIVE METABOLIC PANEL
ALT: 33 U/L (ref 0–35)
AST: 44 U/L — ABNORMAL HIGH (ref 0–37)
Albumin: 4.5 g/dL (ref 3.5–5.2)
Alkaline Phosphatase: 48 U/L (ref 39–117)
BUN: 8 mg/dL (ref 6–23)
CO2: 27 mEq/L (ref 19–32)
Calcium: 8.9 mg/dL (ref 8.4–10.5)
Chloride: 102 mEq/L (ref 96–112)
Creatinine, Ser: 0.68 mg/dL (ref 0.40–1.20)
GFR: 110.99 mL/min (ref >60.00)
Glucose, Bld: 94 mg/dL (ref 70–99)
Potassium: 3.6 mEq/L (ref 3.5–5.1)
Sodium: 140 mEq/L (ref 135–145)
Total Bilirubin: 0.6 mg/dL (ref 0.2–1.2)
Total Protein: 7 g/dL (ref 6.0–8.3)

## 2022-04-21 NOTE — Progress Notes (Signed)
No critical labs need to be addressed urgently. We will discuss labs in detail at upcoming office visit.   

## 2022-04-23 ENCOUNTER — Encounter: Payer: Self-pay | Admitting: Family Medicine

## 2022-04-28 ENCOUNTER — Ambulatory Visit (INDEPENDENT_AMBULATORY_CARE_PROVIDER_SITE_OTHER): Payer: 59 | Admitting: Family Medicine

## 2022-04-28 VITALS — BP 114/70 | HR 83 | Temp 98.8°F | Ht 63.5 in | Wt 133.2 lb

## 2022-04-28 DIAGNOSIS — R69 Illness, unspecified: Secondary | ICD-10-CM | POA: Diagnosis not present

## 2022-04-28 DIAGNOSIS — N898 Other specified noninflammatory disorders of vagina: Secondary | ICD-10-CM | POA: Diagnosis not present

## 2022-04-28 DIAGNOSIS — F331 Major depressive disorder, recurrent, moderate: Secondary | ICD-10-CM

## 2022-04-28 DIAGNOSIS — I10 Essential (primary) hypertension: Secondary | ICD-10-CM | POA: Diagnosis not present

## 2022-04-28 DIAGNOSIS — R7303 Prediabetes: Secondary | ICD-10-CM | POA: Diagnosis not present

## 2022-04-28 DIAGNOSIS — Z23 Encounter for immunization: Secondary | ICD-10-CM

## 2022-04-28 DIAGNOSIS — F411 Generalized anxiety disorder: Secondary | ICD-10-CM

## 2022-04-28 DIAGNOSIS — Z0000 Encounter for general adult medical examination without abnormal findings: Secondary | ICD-10-CM

## 2022-04-28 NOTE — Assessment & Plan Note (Signed)
Acute flare of chronic recurrent issue.  Will check send out wet prep.  She will call gynecology for follow-up on long-term recommendations given multiple recurrence.

## 2022-04-28 NOTE — Progress Notes (Signed)
Patient ID: Angela [name], female    DOB: 05-Nov-1985, 36 y.o.   MRN: [id number]  This visit was conducted in person.  BP 114/70   Pulse 83   Temp 98.8 F (37.1 C) (Oral)   Ht 5' 3.5" (1.613 m)   Wt 133 lb 4 oz (60.4 kg)   SpO2 96%   BMI 23.23 kg/m    CC:  Chief Complaint  Patient presents with   Annual Exam    Subjective:   HPI: Angela [name] is a 36 y.o. female presenting on 04/28/2022 for Annual Exam  She has been noting vaginal discharge, thin white with odor ongoing for the last week.  She has a history of recurrent BV and yeast infections.  She is planning on returning to see her gynecologist for further evaluation.  MDD/GAD:  Well controlled on prozac 40 mg daily, no requiring atarax, no panic attacks.  PHQ9: 5  GAD7:4.  Reviewed labs in detail with patient.  Prediabetes  Lab Results  Component Value Date   HGBA1C 5.4 04/18/2021   Hypertension:   Well  controlled on losartan 100 mg daily, amlodipine 10 mg daily, metoprolol xl 25 mg daily BP Readings from Last 3 Encounters:  04/28/22 114/70  02/26/22 122/76  12/03/21 117/80  Using medication without problems or lightheadedness:  none Chest pain with exertion: none Edema:none Short of breath:none Average home BPs: Other issues:  Using  Detrol LA for OAB.  GERD: treated with pantoprazole.  At ideal weight  Wt Readings from Last 3 Encounters:  04/28/22 133 lb 4 oz (60.4 kg)  02/26/22 140 lb 8 oz (63.7 kg)  12/03/21 144 lb 6.4 oz (65.5 kg)  Body mass index is 23.23 kg/m.  Exercise :  regularly.  Diet: healthier eating habits, no fried foods.       Relevant past medical, surgical, family and social history reviewed and updated as indicated. Interim medical history since our last visit reviewed. Allergies and medications reviewed and updated. Outpatient Medications Prior to Visit  Medication Sig Dispense Refill   amLODipine (NORVASC) 10 MG tablet Take 1 tablet (10 mg total) by  mouth daily. 90 tablet 0   cyclobenzaprine (FLEXERIL) 10 MG tablet TAKE 1 TABLET (10 MG TOTAL) BY MOUTH AT BEDTIME AS NEEDED FOR MUSCLE SPASMS. 15 tablet 0   FLUoxetine (PROZAC) 40 MG capsule Take 1 capsule (40 mg total) by mouth daily. 90 capsule 1   Galcanezumab-gnlm (EMGALITY) 120 MG/ML SOAJ 1INJECT 120MG  INTO THE SKIN EVERY 30 DAYS 3 mL 3   hydrOXYzine (ATARAX/VISTARIL) 10 MG tablet Take 1 tablet (10 mg total) by mouth 3 (three) times daily as needed for anxiety. 30 tablet 0   losartan (COZAAR) 100 MG tablet TAKE 1 TABLET BY MOUTH EVERY DAY 90 tablet 0   metoprolol succinate (TOPROL-XL) 25 MG 24 hr tablet TAKE 1 TABLET (25 MG TOTAL) BY MOUTH DAILY. 90 tablet 3   pantoprazole (PROTONIX) 40 MG tablet Take 1 tablet (40 mg total) by mouth daily. 90 tablet 1   potassium chloride SA (KLOR-CON M20) 20 MEQ tablet TAKE 1 TABLET EVERY SUNDAY, MONDAY, WEDNESDAY AND FRIDAY 48 tablet 1   promethazine (PHENERGAN) 25 MG tablet Take 1 tablet (25 mg total) by mouth every 6 (six) hours as needed for nausea or vomiting. 30 tablet 11   tolterodine (DETROL LA) 4 MG 24 hr capsule TAKE ONE CAPSULE BY MOUTH DAILY 90 capsule 0   valACYclovir (VALTREX) 1000 MG tablet Take 1 tablet (1,000  mg total) by mouth 3 (three) times daily. 21 tablet 0   metroNIDAZOLE (METROGEL) 0.75 % vaginal gel Place 1 Applicatorful vaginally at bedtime. 70 g 0   No facility-administered medications prior to visit.     Per HPI unless specifically indicated in ROS section below Review of Systems  Constitutional:  Negative for fatigue and fever.  HENT:  Negative for congestion.   Eyes:  Negative for pain.  Respiratory:  Negative for cough and shortness of breath.   Cardiovascular:  Negative for chest pain, palpitations and leg swelling.  Gastrointestinal:  Negative for abdominal pain.  Genitourinary:  Negative for dysuria and vaginal bleeding.  Musculoskeletal:  Negative for back pain.  Neurological:  Negative for syncope,  light-headedness and headaches.  Psychiatric/Behavioral:  Negative for dysphoric mood.    Objective:  BP 114/70   Pulse 83   Temp 98.8 F (37.1 C) (Oral)   Ht 5' 3.5" (1.613 m)   Wt 133 lb 4 oz (60.4 kg)   SpO2 96%   BMI 23.23 kg/m   Wt Readings from Last 3 Encounters:  04/28/22 133 lb 4 oz (60.4 kg)  02/26/22 140 lb 8 oz (63.7 kg)  12/03/21 144 lb 6.4 oz (65.5 kg)      Physical Exam Vitals and nursing note reviewed.  Constitutional:      General: She is not in acute distress.    Appearance: Normal appearance. She is well-developed. She is not ill-appearing or toxic-appearing.  HENT:     Head: Normocephalic.     Right Ear: Hearing, tympanic membrane, ear canal and external ear normal.     Left Ear: Hearing, tympanic membrane, ear canal and external ear normal.     Nose: Nose normal.  Eyes:     General: Lids are normal. Lids are everted, no foreign bodies appreciated.     Conjunctiva/sclera: Conjunctivae normal.     Pupils: Pupils are equal, round, and reactive to light.  Neck:     Thyroid: No thyroid mass or thyromegaly.     Vascular: No carotid bruit.     Trachea: Trachea normal.  Cardiovascular:     Rate and Rhythm: Normal rate and regular rhythm.     Heart sounds: Normal heart sounds, S1 normal and S2 normal. No murmur heard.    No gallop.  Pulmonary:     Effort: Pulmonary effort is normal. No respiratory distress.     Breath sounds: Normal breath sounds. No wheezing, rhonchi or rales.  Abdominal:     General: Bowel sounds are normal. There is no distension or abdominal bruit.     Palpations: Abdomen is soft. There is no fluid wave or mass.     Tenderness: There is no abdominal tenderness. There is no guarding or rebound.     Hernia: No hernia is present.  Musculoskeletal:     Cervical back: Normal range of motion and neck supple.  Lymphadenopathy:     Cervical: No cervical adenopathy.  Skin:    General: Skin is warm and dry.     Findings: No rash.   Neurological:     Mental Status: She is alert.     Cranial Nerves: No cranial nerve deficit.     Sensory: No sensory deficit.  Psychiatric:        Mood and Affect: Mood is not anxious or depressed.        Speech: Speech normal.        Behavior: Behavior normal. Behavior is cooperative.  Judgment: Judgment normal.       Results for orders placed or performed in visit on 04/21/22  Comprehensive metabolic panel  Result Value Ref Range   Sodium 140 135 - 145 mEq/L   Potassium 3.6 3.5 - 5.1 mEq/L   Chloride 102 96 - 112 mEq/L   CO2 27 19 - 32 mEq/L   Glucose, Bld 94 70 - 99 mg/dL   BUN 8 6 - 23 mg/dL   Creatinine, Ser 6.04 0.40 - 1.20 mg/dL   Total Bilirubin 0.6 0.2 - 1.2 mg/dL   Alkaline Phosphatase 48 39 - 117 U/L   AST 44 (H) 0 - 37 U/L   ALT 33 0 - 35 U/L   Total Protein 7.0 6.0 - 8.3 g/dL   Albumin 4.5 3.5 - 5.2 g/dL   GFR 540.98 >11.91 mL/min   Calcium 8.9 8.4 - 10.5 mg/dL  Lipid panel  Result Value Ref Range   Cholesterol 157 0 - 200 mg/dL   Triglycerides 478.2 0.0 - 149.0 mg/dL   HDL 95.62 >13.08 mg/dL   VLDL 65.7 0.0 - 84.6 mg/dL   LDL Cholesterol 40 0 - 99 mg/dL   Total CHOL/HDL Ratio 2    NonHDL 67.47      COVID 19 screen:  No recent travel or known exposure to COVID19 The patient denies respiratory symptoms of COVID 19 at this time. The importance of social distancing was discussed today.   Assessment and Plan The patient's preventative maintenance and recommended screening tests for an annual wellness exam were reviewed in full today. Brought up to date unless services declined.  Counselled on the importance of diet, exercise, and its role in overall health and mortality. The patient's FH and SH was reviewed, including their home life, tobacco status, and drug and alcohol status.    Vaccines: Gave flu  vaccine today and  uptodateTdap . S/P COVID x 3 Pelvic: DVE/pap; last pap 2022 nml, no high risk HPV, repeat in 5 years. No early family history  of colon, uterine , ovarian or breast cancer.  Nonsmoker HIV/STD: neg 2013   Hep C:  done  Problem List Items Addressed This Visit     GAD (generalized anxiety disorder)   HTN (hypertension), benign   MDD (major depressive disorder), recurrent episode, moderate (HCC)   Prediabetes   Vaginal discharge    Acute flare of chronic recurrent issue.  Will check send out wet prep.  She will call gynecology for follow-up on long-term recommendations given multiple recurrence.      Relevant Orders   WET PREP BY MOLECULAR PROBE   Other Visit Diagnoses     Routine general medical examination at a health care facility    -  Primary   Need for influenza vaccination       Relevant Orders   Flu Vaccine QUAD 6+ mos PF IM (Fluarix Quad PF) (Completed)       [name], MD

## 2022-04-28 NOTE — Patient Instructions (Signed)
Keep up the good work with regular exercise and healthy eating habits. We will call with wet prep results.  Call to set up a follow-up with your gynecologist for recurrent vaginal infection.

## 2022-04-29 ENCOUNTER — Encounter: Payer: Self-pay | Admitting: Family Medicine

## 2022-04-29 LAB — WET PREP BY MOLECULAR PROBE
Candida species: NOT DETECTED
MICRO NUMBER:: 14334646
SPECIMEN QUALITY:: ADEQUATE
Trichomonas vaginosis: NOT DETECTED

## 2022-04-30 MED ORDER — METRONIDAZOLE 500 MG PO TABS: 500.0000 mg | ORAL_TABLET | Freq: Two times a day (BID) | ORAL | 0 refills | Status: DC

## 2022-04-30 MED ORDER — FLUCONAZOLE 150 MG PO TABS: 150.0000 mg | ORAL_TABLET | Freq: Once | ORAL | 0 refills | Status: AC

## 2022-05-08 ENCOUNTER — Encounter: Payer: Self-pay | Admitting: Family Medicine

## 2022-05-08 ENCOUNTER — Other Ambulatory Visit: Payer: Self-pay | Admitting: Family Medicine

## 2022-05-16 ENCOUNTER — Other Ambulatory Visit: Payer: Self-pay | Admitting: Family Medicine

## 2022-06-05 ENCOUNTER — Other Ambulatory Visit: Payer: Self-pay | Admitting: Family Medicine

## 2022-06-14 ENCOUNTER — Other Ambulatory Visit: Payer: Self-pay | Admitting: Family Medicine

## 2022-06-25 DIAGNOSIS — N760 Acute vaginitis: Secondary | ICD-10-CM | POA: Diagnosis not present

## 2022-06-25 DIAGNOSIS — Z6823 Body mass index (BMI) 23.0-23.9, adult: Secondary | ICD-10-CM | POA: Diagnosis not present

## 2022-06-25 DIAGNOSIS — Z01419 Encounter for gynecological examination (general) (routine) without abnormal findings: Secondary | ICD-10-CM | POA: Diagnosis not present

## 2022-07-03 ENCOUNTER — Other Ambulatory Visit: Payer: Self-pay | Admitting: Family Medicine

## 2022-07-30 ENCOUNTER — Other Ambulatory Visit: Payer: Self-pay | Admitting: Family Medicine

## 2022-08-11 DIAGNOSIS — N3000 Acute cystitis without hematuria: Secondary | ICD-10-CM | POA: Diagnosis not present

## 2022-08-18 ENCOUNTER — Ambulatory Visit (INDEPENDENT_AMBULATORY_CARE_PROVIDER_SITE_OTHER): Payer: 59 | Admitting: Family Medicine

## 2022-08-18 ENCOUNTER — Encounter: Payer: Self-pay | Admitting: Family Medicine

## 2022-08-18 VITALS — BP 110/70 | HR 87 | Temp 98.3°F | Ht 63.5 in | Wt 130.1 lb

## 2022-08-18 DIAGNOSIS — Z202 Contact with and (suspected) exposure to infections with a predominantly sexual mode of transmission: Secondary | ICD-10-CM | POA: Insufficient documentation

## 2022-08-18 NOTE — Assessment & Plan Note (Signed)
Acute, recent new exposure with new partner. Will test for gonorrhea and chlamydia.  Encouraged patient that if test returns positive to consider follow-up STI panel.  She refuses at today's office visit.  Patient has some vague symptoms and [facility].

## 2022-08-18 NOTE — Progress Notes (Signed)
Patient ID: Angela [name], female    DOB: 09/01/1985, 37 y.o.   MRN: [id number]  This visit was conducted in person.  BP 110/70   Pulse 87   Temp 98.3 F (36.8 C) (Temporal)   Ht 5' 3.5" (1.613 m)   Wt 130 lb 2 oz (59 kg)   SpO2 97%   BMI 22.69 kg/m    CC:  Chief Complaint  Patient presents with   STI Testing    Subjective:   HPI: Angela [name] is a 37 y.o. female presenting on 08/18/2022 for STI Testing   In last  2 weeks  Having some symptoms of  dull pain at end of urination.  Cloudy urine.  MIld cramping abdominally.   Has new partner.. . Gonorrhea  diagnosis about 2 weeks ago.   [facility]en at [facility].. neg UA, culture   She is not concerned about other STDs.     Relevant past medical, surgical, family and social history reviewed and updated as indicated. Interim medical history since our last visit reviewed. Allergies and medications reviewed and updated. Outpatient Medications Prior to Visit  Medication Sig Dispense Refill   amLODipine (NORVASC) 10 MG tablet TAKE 1 TABLET BY MOUTH EVERY DAY 90 tablet 3   cyclobenzaprine (FLEXERIL) 10 MG tablet TAKE 1 TABLET (10 MG TOTAL) BY MOUTH AT BEDTIME AS NEEDED FOR MUSCLE SPASMS. 15 tablet 0   FLUoxetine (PROZAC) 40 MG capsule TAKE 1 CAPSULE (40 MG TOTAL) BY MOUTH DAILY. 90 capsule 1   Galcanezumab-gnlm (EMGALITY) 120 MG/ML SOAJ 1INJECT 120MG  INTO THE SKIN EVERY 30 DAYS 3 mL 3   hydrOXYzine (ATARAX/VISTARIL) 10 MG tablet Take 1 tablet (10 mg total) by mouth 3 (three) times daily as needed for anxiety. 30 tablet 0   losartan (COZAAR) 100 MG tablet TAKE 1 TABLET BY MOUTH EVERY DAY 90 tablet 3   metoprolol succinate (TOPROL-XL) 25 MG 24 hr tablet TAKE 1 TABLET (25 MG TOTAL) BY MOUTH DAILY. 90 tablet 3   metroNIDAZOLE (FLAGYL) 500 MG tablet Take 1 tablet (500 mg total) by mouth 2 (two) times daily. 14 tablet 0   pantoprazole (PROTONIX) 40 MG tablet TAKE 1 TABLET BY MOUTH DAILY 90 tablet 3   potassium chloride  SA (KLOR-CON M20) 20 MEQ tablet TAKE 1 TABLET EVERY SUNDAY, MONDAY, WEDNESDAY AND FRIDAY 48 tablet 1   promethazine (PHENERGAN) 25 MG tablet Take 1 tablet (25 mg total) by mouth every 6 (six) hours as needed for nausea or vomiting. 30 tablet 11   tolterodine (DETROL LA) 4 MG 24 hr capsule TAKE 1 CAPSULE BY MOUTH DAILY 90 capsule 3   valACYclovir (VALTREX) 1000 MG tablet Take 1 tablet (1,000 mg total) by mouth 3 (three) times daily. 21 tablet 0   No facility-administered medications prior to visit.     Per HPI unless specifically indicated in ROS section below Review of Systems  Constitutional:  Negative for fatigue and fever.  HENT:  Negative for ear pain.   Eyes:  Negative for pain.  Respiratory:  Negative for chest tightness and shortness of breath.   Cardiovascular:  Negative for chest pain, palpitations and leg swelling.  Gastrointestinal:  Negative for abdominal pain.  Genitourinary:  Negative for dysuria.   Objective:  BP 110/70   Pulse 87   Temp 98.3 F (36.8 C) (Temporal)   Ht 5' 3.5" (1.613 m)   Wt 130 lb 2 oz (59 kg)   SpO2 97%   BMI 22.69 kg/m  Wt Readings from Last 3 Encounters:  08/18/22 130 lb 2 oz (59 kg)  04/28/22 133 lb 4 oz (60.4 kg)  02/26/22 140 lb 8 oz (63.7 kg)      Physical Exam Constitutional:      General: She is not in acute distress.    Appearance: Normal appearance. She is well-developed. She is not ill-appearing or toxic-appearing.  HENT:     Head: Normocephalic.     Right Ear: Hearing, tympanic membrane, ear canal and external ear normal. Tympanic membrane is not erythematous, retracted or bulging.     Left Ear: Hearing, tympanic membrane, ear canal and external ear normal. Tympanic membrane is not erythematous, retracted or bulging.     Nose: No mucosal edema or rhinorrhea.     Right Sinus: No maxillary sinus tenderness or frontal sinus tenderness.     Left Sinus: No maxillary sinus tenderness or frontal sinus tenderness.     Mouth/Throat:      Pharynx: Uvula midline.  Eyes:     General: Lids are normal. Lids are everted, no foreign bodies appreciated.     Conjunctiva/sclera: Conjunctivae normal.     Pupils: Pupils are equal, round, and reactive to light.  Neck:     Thyroid: No thyroid mass or thyromegaly.     Vascular: No carotid bruit.     Trachea: Trachea normal.  Cardiovascular:     Rate and Rhythm: Normal rate and regular rhythm.     Pulses: Normal pulses.     Heart sounds: Normal heart sounds, S1 normal and S2 normal. No murmur heard.    No friction rub. No gallop.  Pulmonary:     Effort: Pulmonary effort is normal. No tachypnea or respiratory distress.     Breath sounds: Normal breath sounds. No decreased breath sounds, wheezing, rhonchi or rales.  Abdominal:     General: Bowel sounds are normal.     Palpations: Abdomen is soft.     Tenderness: There is no abdominal tenderness.  Musculoskeletal:     Cervical back: Normal range of motion and neck supple.  Skin:    General: Skin is warm and dry.     Findings: No rash.  Neurological:     Mental Status: She is alert.  Psychiatric:        Mood and Affect: Mood is not anxious or depressed.        Speech: Speech normal.        Behavior: Behavior normal. Behavior is cooperative.        Thought Content: Thought content normal.        Judgment: Judgment normal.       Results for orders placed or performed in visit on 04/28/22  WET PREP BY MOLECULAR PROBE   Specimen: Vaginal Fluid  Result Value Ref Range   MICRO NUMBER: [id number]    SPECIMEN QUALITY: Adequate    SOURCE: VAGINA    STATUS: FINAL    Trichomonas vaginosis Not Detected    Gardnerella vaginalis (A)     Detected. Increased levels of G. vaginalis may not be significant in the absence of signs and symptoms of bacterial vaginosis.   Candida species Not Detected     Assessment and Plan  Exposure to gonorrhea Assessment & Plan: Acute, recent new exposure with new partner. Will test for gonorrhea  and chlamydia.  Encouraged patient that if test returns positive to consider follow-up STI panel.  She refuses at today's office visit.  Patient has some vague symptoms and  [facility]  Orders: -     C. trachomatis/N. gonorrhoeae RNA    No follow-ups on file.   Kerby NoraAmy Asta Corbridge, MD

## 2022-08-19 LAB — C. TRACHOMATIS/N. GONORRHOEAE RNA
C. trachomatis RNA, TMA: NOT DETECTED
N. gonorrhoeae RNA, TMA: NOT DETECTED

## 2022-08-21 ENCOUNTER — Encounter (HOSPITAL_COMMUNITY): Payer: Self-pay

## 2022-08-21 ENCOUNTER — Ambulatory Visit (HOSPITAL_COMMUNITY)
Admission: EM | Admit: 2022-08-21 | Discharge: 2022-08-21 | Disposition: A | Discharge status: Home / Self Care | Payer: 59 | Attending: Emergency Medicine | Admitting: Emergency Medicine

## 2022-08-21 DIAGNOSIS — S39012A Strain of muscle, fascia and tendon of lower back, initial encounter: Secondary | ICD-10-CM | POA: Diagnosis not present

## 2022-08-21 MED ORDER — KETOROLAC TROMETHAMINE 30 MG/ML IJ SOLN
30.0000 mg | Freq: Once | INTRAMUSCULAR | Status: AC
  Administered 2022-08-21: 30 mg via INTRAMUSCULAR

## 2022-08-21 MED ORDER — CYCLOBENZAPRINE HCL 10 MG PO TABS: 10.0000 mg | ORAL_TABLET | Freq: Two times a day (BID) | ORAL | 0 refills | Status: AC | PRN

## 2022-08-21 MED ORDER — IBUPROFEN 800 MG PO TABS: 800.0000 mg | ORAL_TABLET | Freq: Three times a day (TID) | ORAL | 0 refills | Status: AC

## 2022-08-21 MED ORDER — KETOROLAC TROMETHAMINE 30 MG/ML IJ SOLN
INTRAMUSCULAR | Status: AC
  Filled 2022-08-21: qty 1

## 2022-08-21 NOTE — ED Provider Notes (Signed)
[facility]    CSN: [id number] Arrival date & time: 08/21/22  0947     History   Chief Complaint Chief Complaint  Patient presents with   Back Pain    HPI Angela [name] is a 37 y.o. female.  A week ago she had some lower back pain that resolved with tylenol. It went away, then about 4 days ago she was cleaning/heavy lifting and bending. Pain came back but not relieved with tylenol. Pain today is 6/10 Denies blunt injury or trauma No weakness, numbness or tingling of the extremities. 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   Migraine 08/11/2016   Menorrhagia with regular cycle 05/14/2015   HTN (hypertension), benign 05/14/2015   Prediabetes 05/14/2015   Overweight (BMI 25.0-29.9) 02/14/2015   GERD (gastroesophageal reflux disease)  02/14/2015   History of migraine 02/14/2015   Urge incontinence 02/14/2015   Genital herpes 02/14/2015   S/P tubal ligation 11/16/2012    Past Surgical History:  Procedure Laterality Date   CERVIX LESION DESTRUCTION     CESAREAN SECTION  2005, 2010   x2   CESAREAN SECTION WITH BILATERAL TUBAL LIGATION Bilateral 11/15/2012   Procedure: REPEAT CESAREAN SECTION WITH BILATERAL TUBAL LIGATION;  Surgeon: Kirkland Hun, MD;  Location: [facility];  Service: Obstetrics;  Laterality: Bilateral;   COLPOSCOPY     UNILATERAL SALPINGECTOMY Right 11/15/2012   Procedure: UNILATERAL SALPINGECTOMY;  Surgeon: Kirkland Hun, MD;  Location: [facility];  Service: Obstetrics;  Laterality: Right;   WISDOM TOOTH EXTRACTION      OB History     Gravida  3   Para  3   Term  2   Preterm  1   AB      Living  3      SAB      IAB      Ectopic      Multiple      Live Births  3        Obstetric Comments  2005 HYPEREMESIS 2010 MECONIUM ASPIRATION          Home Medications    Prior to Admission medications   Medication Sig Start Date End Date Taking? Authorizing Provider  cyclobenzaprine (  FLEXERIL) 10 MG tablet Take 1 tablet (10 mg total) by mouth 2 (two) times daily as needed for muscle spasms. 08/21/22  Yes Dunbar Buras, Lurena Joiner, PA-C  ibuprofen (ADVIL) 800 MG tablet Take 1 tablet (800 mg total) by mouth 3 (three) times daily. 08/21/22  Yes Krishawna Stiefel, Lurena Joiner, PA-C  amLODipine (NORVASC) 10 MG tablet TAKE 1 TABLET BY MOUTH EVERY DAY 06/14/22   Bedsole, Amy E, MD  FLUoxetine (PROZAC) 40 MG capsule TAKE 1 CAPSULE (40 MG TOTAL) BY MOUTH DAILY. 07/30/22   Bedsole, Amy E, MD  Galcanezumab-gnlm (EMGALITY) 120 MG/ML SOAJ 1INJECT  INTO THE SKIN EVERY 30 DAYS 12/03/21   Lomax, Amy, NP  losartan (COZAAR) 100 MG tablet TAKE 1 TABLET BY MOUTH EVERY DAY 07/03/22   Bedsole, Amy E, MD  metoprolol succinate (TOPROL-XL) 25 MG 24 hr tablet TAKE 1 TABLET (25 MG TOTAL) BY MOUTH DAILY. 06/05/22   Bedsole, Amy E, MD   pantoprazole (PROTONIX) 40 MG tablet TAKE 1 TABLET BY MOUTH DAILY 05/17/22   Bedsole, Amy E, MD  potassium chloride SA (KLOR-CON M20) 20 MEQ tablet TAKE 1 TABLET EVERY SUNDAY, MONDAY, WEDNESDAY AND FRIDAY 05/08/22   Bedsole, Amy E, MD  tolterodine (DETROL LA) 4 MG 24 hr capsule TAKE 1 CAPSULE BY MOUTH DAILY 05/17/22   Excell Seltzer, MD    Family History Family History  Problem Relation Age of Onset   Anesthesia problems Mother        NAUSEA AND VOMITING   Thyroid disease Mother    Fibromyalgia Mother    Sarcoidosis Mother    Depression Mother    Other Mother        VARICOSE VEINS   Anesthesia problems Other    Diabetes Father    Hypertension Sister    Arthritis Maternal Grandmother    Heart disease Maternal Grandmother    Hypertension Maternal Grandmother    Kidney disease Maternal Grandmother        FAILURE   Prostate cancer Maternal Grandfather    Depression Maternal Aunt    Sarcoidosis Maternal Aunt    Asthma Cousin    Liver disease Cousin        requiring liver transplant- unknown as to what type of liver disease   Colon cancer Neg Hx    Esophageal cancer Neg Hx    Pancreatic cancer Neg Hx    Stomach cancer Neg Hx    Inflammatory bowel disease Neg Hx    Rectal cancer Neg Hx     Social History Social History   Tobacco Use   Smoking status: Never   Smokeless tobacco: Never  Vaping Use   Vaping Use: Never used  Substance Use Topics   Alcohol use: Yes    Alcohol/week: 3.0 standard drinks of alcohol    Types: 2 Glasses of wine, 1 Shots of liquor per week    Comment: weekends   Drug use: No     Allergies   Amoxicillin and Dexilant [dexlansoprazole]   Review of Systems Review of Systems  Musculoskeletal:  Positive for back pain.   As per HPI  Physical Exam Triage Vital Signs ED Triage Vitals [08/21/22 1050]  Enc Vitals Group     BP 124/79     Pulse Rate 92     Resp 18     Temp 99.4 F (37.4 C)     Temp Source Oral     SpO2 95 %     Weight       Height  Head Circumference      Peak Flow      Pain Score 6     Pain Loc      Pain Edu?      Excl. in GC?    No data found.  Updated Vital Signs BP 124/79 (BP Location: Left Arm)   Pulse 92   Temp 99.4 F (37.4 C) (Oral)   Resp 18   SpO2 95%   Physical Exam Vitals and nursing note reviewed.  Constitutional:      General: She is not in acute distress. HENT:     Mouth/Throat:     Mouth: Mucous membranes are moist.     Pharynx: Oropharynx is clear.  Eyes:     Extraocular Movements: Extraocular movements intact.     Conjunctiva/sclera: Conjunctivae normal.     Pupils: Pupils are equal, round, and reactive to light.  Cardiovascular:     Rate and Rhythm: Normal rate and regular rhythm.     Heart sounds: Normal heart sounds.  Pulmonary:     Effort: Pulmonary effort is normal.     Breath sounds: Normal breath sounds.  Musculoskeletal:        General: Normal range of motion.     Cervical back: Normal range of motion. No rigidity or tenderness.     Comments: Full ROM of neck and back. No bony tenderness. A little muscular tenderness left lower back.  Full ROM of all joints  Skin:    General: Skin is warm and dry.     Findings: No bruising, erythema or rash.  Neurological:     General: No focal deficit present.     Mental Status: She is alert and oriented to person, place, and time.     Cranial Nerves: Cranial nerves 2-12 are intact.     Sensory: Sensation is intact.     Motor: Motor function is intact. No weakness.     Coordination: Coordination is intact.     Gait: Gait is intact.     Deep Tendon Reflexes: Reflexes are normal and symmetric.     Comments: Strength 5/5 all extremities, sensation and pulses intact     UC Treatments / Results  Labs (all labs ordered are listed, but only abnormal results are displayed) Labs Reviewed - No data to display  EKG   Radiology No results found.  Procedures Procedures   Medications Ordered in UC Medications   ketorolac (TORADOL) 30 MG/ML injection 30 mg (30 mg Intramuscular Given 08/21/22 1134)    Initial Impression / Assessment and Plan / UC Course  I have reviewed the triage vital signs and the nursing notes.  Pertinent labs & imaging results that were available during my care of the patient were reviewed by me and considered in my medical decision making (see chart for details).  Neurologically intact, good exam.  Stable vitals.  She is well-appearing. Suspect muscular etiology at this time, likely from heavy lifting/twisting Toradol IM given in clinic.  Discussed symptomatic care at home, flexeril BID prn  Can return if needed.  Final Clinical Impressions(s) / UC Diagnoses   Final diagnoses:  Strain of lumbar region, initial encounter     Discharge Instructions      The injection today should provide good pain relief.  If needed you can use Tylenol as well.  Do not use any ibuprofen/Advil, Aleve/naproxen until tomorrow.  You can take ibuprofen up to 3 times daily, alternate with Tylenol.  I do recommend doing this for the  next several days to reduce inflammation and control pain.  You can take the muscle relaxer (flexeril) twice daily. If the medication makes you drowsy, take only at bed time.  Try hot pad or ice to the back.  Gentle stretching.  Avoid heavy lifting. It may take several days to a week for improvement. Please return if needed.     ED Prescriptions     Medication Sig Dispense Auth. Provider   ibuprofen (ADVIL) 800 MG tablet Take 1 tablet (800 mg total) by mouth 3 (three) times daily. 21 tablet Ladana Chavero, PA-C   cyclobenzaprine (FLEXERIL) 10 MG tablet Take 1 tablet (10 mg total) by mouth 2 (two) times daily as needed for muscle spasms. 20 tablet Keziah Avis, Lurena Joiner, PA-C      PDMP not reviewed this encounter.   Kathrine Haddock 08/21/22 1335

## 2022-08-21 NOTE — ED Triage Notes (Signed)
Pt c/o lt mid/lower back pain for over a week and worse this week after bending/pulling/lifting last weekend. States taking tylenol with little relief.

## 2022-08-21 NOTE — Discharge Instructions (Addendum)
The injection today should provide good pain relief.  If needed you can use Tylenol as well.  Do not use any ibuprofen/Advil, Aleve/naproxen until tomorrow.  You can take ibuprofen up to 3 times daily, alternate with Tylenol.  I do recommend doing this for the next several days to reduce inflammation and control pain.  You can take the muscle relaxer (flexeril) twice daily. If the medication makes you drowsy, take only at bed time.  Try hot pad or ice to the back.  Gentle stretching.  Avoid heavy lifting. It may take several days to a week for improvement. Please return if needed.

## 2022-08-24 ENCOUNTER — Other Ambulatory Visit: Payer: Self-pay | Admitting: Family Medicine

## 2022-08-25 ENCOUNTER — Encounter: Payer: Self-pay | Admitting: Family Medicine

## 2022-08-25 NOTE — Telephone Encounter (Signed)
FMLA form printed and placed in Dr. Daphine Deutscher office in box to complete.

## 2022-09-02 NOTE — Telephone Encounter (Signed)
Completed FMLA paperwork faxed [employer] LOA Department at [phone].

## 2022-10-23 ENCOUNTER — Other Ambulatory Visit: Payer: Self-pay | Admitting: Family Medicine

## 2022-12-03 NOTE — Progress Notes (Deleted)
   PATIENT: Angela [name] DOB: 07-10-1985  REASON FOR VISIT: follow up HISTORY FROM: patient  Virtual Visit via Telephone Note  I connected with [name] on 12/03/22 at  8:00 AM EDT by telephone and verified that I am speaking with the correct person using two identifiers.   I discussed the limitations, risks, security and privacy concerns of performing an evaluation and management service by telephone and the availability of in person appointments. I also discussed with the patient that there may be a patient responsible charge related to this service. The patient expressed understanding and agreed to proceed.   History of Present Illness:  12/03/22 ALL (Mychart): Angela [name] is a 37 y.o. female here today for follow up for migraines. She continues Emgality every 30 days and phenergan??? as needed.   12/03/21 ALL:  [name] returns for follow up. She continues Emgality monthly and phenergan as needed. Migraines remain well managed. She does feel that Emgality wears off toward the end of the month. She may have 1-2 migraines but easily aborted with phenergan and rest. She does not usually need OTC analgesics.   12/03/2020 ALL: [name] returns for follow up for migraines. She continues Emgality and tolerating well. She does have more headaches just prior to next injection. She has a headache band she uses in combination with resting in a dark room for abortive therapy that works well. She knows stress is most common trigger for her. She is feeling well, today.   12/04/2019 ALL:  Angela [name] is a 37 y.o. female here today for follow up for migraines. She continues Emgality monthly. She feels that she is doing well. She does note increase in headache frequency the week prior to next injection. She is able to use complementary therapies to abort headache. She is unable to tolerate triptan medications. She rarely uses phenergan.     Observations/Objective:  Generalized: Well developed, in no acute distress  Mentation: Alert oriented to time, place, history taking. Follows all commands speech and language fluent   Assessment and Plan:  37 y.o. year old female  has a past medical history of Abnormal Pap smear (AGE 63), Allergy, Anemia, Complication of anesthesia, Genital herpes, GERD (gastroesophageal reflux disease) (AGE 49), History of chicken pox, Hypertension, Infection (AGE 18), Infection (2007), Infection, Migraines, Ovarian cyst rupture, PONV (postoperative nausea and vomiting), Preterm labor, and Urinary incontinence. here with  No diagnosis found.  No orders of the defined types were placed in this encounter.   No orders of the defined types were placed in this encounter.    Follow Up Instructions:  I discussed the assessment and treatment plan with the patient. The patient was provided an opportunity to ask questions and all were answered. The patient agreed with the plan and demonstrated an understanding of the instructions.   The patient was advised to call back or seek an in-person evaluation if the symptoms worsen or if the condition fails to improve as anticipated.  I provided *** minutes of non-face-to-face time during this encounter. Patient located at their place of residence during Mychart visit. Provider is in the office.    Shawnie Dapper, NP

## 2022-12-08 ENCOUNTER — Telehealth: Payer: 59 | Admitting: Family Medicine

## 2022-12-08 DIAGNOSIS — G43709 Chronic migraine without aura, not intractable, without status migrainosus: Secondary | ICD-10-CM

## 2023-01-20 ENCOUNTER — Other Ambulatory Visit: Payer: Self-pay | Admitting: Family Medicine

## 2023-01-27 ENCOUNTER — Encounter: Payer: Self-pay | Admitting: Family Medicine

## 2023-01-27 NOTE — Telephone Encounter (Signed)
Forms printed and placed in Dr. Daphine Deutscher office in box to complete.

## 2023-01-29 NOTE — Telephone Encounter (Signed)
Completed FMLA paperwork faxed to [phone].

## 2023-04-05 ENCOUNTER — Encounter: Payer: Self-pay | Admitting: Family Medicine

## 2023-04-06 ENCOUNTER — Telehealth: Payer: Self-pay | Admitting: *Deleted

## 2023-04-06 DIAGNOSIS — I10 Essential (primary) hypertension: Secondary | ICD-10-CM

## 2023-04-06 DIAGNOSIS — R7303 Prediabetes: Secondary | ICD-10-CM

## 2023-04-06 NOTE — Telephone Encounter (Signed)
-----   Message from Alvina Chou sent at 04/06/2023  3:42 PM EST ----- Regarding: Lab orders for Fri, 12.13.24 Patient is scheduled for CPX labs, please order future labs, Thanks , Camelia Eng

## 2023-04-13 ENCOUNTER — Encounter: Payer: Self-pay | Admitting: Family Medicine

## 2023-04-13 ENCOUNTER — Ambulatory Visit (INDEPENDENT_AMBULATORY_CARE_PROVIDER_SITE_OTHER): Payer: 59 | Admitting: Family Medicine

## 2023-04-13 VITALS — BP 120/80 | HR 90 | Temp 97.9°F | Ht 63.5 in | Wt 143.2 lb

## 2023-04-13 DIAGNOSIS — Z23 Encounter for immunization: Secondary | ICD-10-CM

## 2023-04-13 DIAGNOSIS — G43709 Chronic migraine without aura, not intractable, without status migrainosus: Secondary | ICD-10-CM | POA: Diagnosis not present

## 2023-04-13 NOTE — Assessment & Plan Note (Signed)
Chronic with slight increase in frequency.  Patient's FMLA form for intermittently states she can be absent once a month for 24 to 48 hours at a time.  In November she had 2 migraines per month resulting in 2 absences which put her over her leave by 4.5 hours. Form was signed to verify medical need for leave. I also attempted to adjust her previous FMLA form from leave needed q. monthly to 2 times per month but repeat completion of entire form may be needed.

## 2023-04-13 NOTE — Progress Notes (Signed)
Patient ID: Angela [name], female    DOB: [birth year], 37 y.o.   MRN: [id number]  This visit was conducted in person.  BP 120/80 (BP Location: Left Arm, Patient Position: Sitting, Cuff Size: Normal)   Pulse 90   Temp 97.9 F (36.6 C) (Temporal)   Ht 5' 3.5" (1.613 m)   Wt 143 lb 4 oz (65 kg)   SpO2 98%   BMI 24.98 kg/m    CC:  Chief Complaint  Patient presents with   Form Completion    FMLA-Discuss increasing intermittent FMLA for migraines/incapacity     Subjective:   HPI: Angela [name] is a 37 y.o. female presenting on 04/13/2023 for Form Completion (FMLA-Discuss increasing intermittent FMLA for migraines/incapacity/)   Common migraine:  She has had an increase in anxiety and stress.. this has increased her migraines. Worse in last 6 months. Migraines occurring 2 times a month.. lasts 24-48 hour a time  Has been out of emgality.. this was working better for her... plans to restart once she reaches the neurologist to restart.   Went over [employer] by 4.5 hours.  Needs to change FMLA form to reflect true need given increase abscenses for increased frequency of migraine      She has been trying to work on stress reduction, healthy lifestyle, keeping up with water and regular exercsie.  Relevant past medical, surgical, family and social history reviewed and updated as indicated. Interim medical history since our last visit reviewed. Allergies and medications reviewed and updated. Outpatient Medications Prior to Visit  Medication Sig Dispense Refill   amLODipine (NORVASC) 10 MG tablet TAKE 1 TABLET BY MOUTH EVERY DAY 90 tablet 3   cyclobenzaprine (FLEXERIL) 10 MG tablet Take 1 tablet (10 mg total) by mouth 2 (two) times daily as needed for muscle spasms. 20 tablet 0   FLUoxetine (PROZAC) 40 MG capsule TAKE 1 CAPSULE (40 MG TOTAL) BY MOUTH DAILY. 90 capsule 0   Galcanezumab-gnlm (EMGALITY) 120 MG/ML SOAJ 1INJECT 120MG  INTO THE SKIN EVERY 30 DAYS 3 mL 3   ibuprofen  (ADVIL) 800 MG tablet Take 1 tablet (800 mg total) by mouth 3 (three) times daily. 21 tablet 0   losartan (COZAAR) 100 MG tablet TAKE 1 TABLET BY MOUTH EVERY DAY 90 tablet 3   metoprolol succinate (TOPROL-XL) 25 MG 24 hr tablet TAKE 1 TABLET (25 MG TOTAL) BY MOUTH DAILY. 90 tablet 3   pantoprazole (PROTONIX) 40 MG tablet TAKE 1 TABLET BY MOUTH DAILY 90 tablet 3   potassium chloride SA (KLOR-CON M20) 20 MEQ tablet TAKE 1 TABLET EVERY SUNDAY, MONDAY, WEDNESDAY AND FRIDAY 48 tablet 1   tolterodine (DETROL LA) 4 MG 24 hr capsule TAKE 1 CAPSULE BY MOUTH DAILY 90 capsule 3   No facility-administered medications prior to visit.     Per HPI unless specifically indicated in ROS section below Review of Systems  Constitutional:  Negative for fatigue and fever.  HENT:  Negative for congestion.   Eyes:  Negative for pain.  Respiratory:  Negative for cough and shortness of breath.   Cardiovascular:  Negative for chest pain, palpitations and leg swelling.  Gastrointestinal:  Negative for abdominal pain.  Genitourinary:  Negative for dysuria and vaginal bleeding.  Musculoskeletal:  Negative for back pain.  Neurological:  Negative for syncope, light-headedness and headaches.  Psychiatric/Behavioral:  Negative for dysphoric mood.    Objective:  BP 120/80 (BP Location: Left Arm, Patient Position: Sitting, Cuff Size: Normal)   Pulse 90  Temp 97.9 F (36.6 C) (Temporal)   Ht 5' 3.5" (1.613 m)   Wt 143 lb 4 oz (65 kg)   SpO2 98%   BMI 24.98 kg/m   Wt Readings from Last 3 Encounters:  04/13/23 143 lb 4 oz (65 kg)  08/18/22 130 lb 2 oz (59 kg)  04/28/22 133 lb 4 oz (60.4 kg)      Physical Exam Constitutional:      General: She is not in acute distress.    Appearance: Normal appearance. She is well-developed. She is not ill-appearing or toxic-appearing.  HENT:     Head: Normocephalic.     Right Ear: Hearing, tympanic membrane, ear canal and external ear normal. Tympanic membrane is not  erythematous, retracted or bulging.     Left Ear: Hearing, tympanic membrane, ear canal and external ear normal. Tympanic membrane is not erythematous, retracted or bulging.     Nose: No mucosal edema or rhinorrhea.     Right Sinus: No maxillary sinus tenderness or frontal sinus tenderness.     Left Sinus: No maxillary sinus tenderness or frontal sinus tenderness.     Mouth/Throat:     Mouth: Oropharynx is clear and moist and mucous membranes are normal.     Pharynx: Uvula midline.  Eyes:     General: Lids are normal. Lids are everted, no foreign bodies appreciated.     Extraocular Movements: EOM normal.     Conjunctiva/sclera: Conjunctivae normal.     Pupils: Pupils are equal, round, and reactive to light.  Neck:     Thyroid: No thyroid mass or thyromegaly.     Vascular: No carotid bruit.     Trachea: Trachea normal.  Cardiovascular:     Rate and Rhythm: Normal rate and regular rhythm.     Pulses: Normal pulses.     Heart sounds: Normal heart sounds, S1 normal and S2 normal. No murmur heard.    No friction rub. No gallop.  Pulmonary:     Effort: Pulmonary effort is normal. No tachypnea or respiratory distress.     Breath sounds: Normal breath sounds. No decreased breath sounds, wheezing, rhonchi or rales.  Abdominal:     General: Bowel sounds are normal.     Palpations: Abdomen is soft.     Tenderness: There is no abdominal tenderness.  Musculoskeletal:     Cervical back: Normal range of motion and neck supple.  Skin:    General: Skin is warm, dry and intact.     Findings: No rash.  Neurological:     Mental Status: She is alert.  Psychiatric:        Mood and Affect: Mood is not anxious or depressed.        Speech: Speech normal.        Behavior: Behavior normal. Behavior is cooperative.        Thought Content: Thought content normal.        Cognition and Memory: Cognition and memory normal.        Judgment: Judgment normal.       Results for orders placed or performed  in visit on 08/18/22  C. trachomatis/N. gonorrhoeae RNA   Specimen: Urine  Result Value Ref Range   C. trachomatis RNA, TMA NOT DETECTED NOT DETECTED   N. gonorrhoeae RNA, TMA NOT DETECTED NOT DETECTED    Assessment and Plan  Chronic migraine without aura without status migrainosus, not intractable Assessment & Plan: Chronic with slight increase in frequency.  Patient's FMLA form  for intermittently states she can be absent once a month for 24 to 48 hours at a time.  In November she had 2 migraines per month resulting in 2 absences which put her over her leave by 4.5 hours. Form was signed to verify medical need for leave. I also attempted to adjust her previous FMLA form from leave needed q. monthly to 2 times per month but repeat completion of entire form may be needed.   Other orders -     Flu Vaccine QUAD 28mo+IM (Fluarix, Fluzone & Alfiuria Quad PF) -     Td vaccine greater than or equal to 7yo preservative free IM    No follow-ups on file.   [name], MD

## 2023-04-14 ENCOUNTER — Other Ambulatory Visit: Payer: Self-pay | Admitting: Family Medicine

## 2023-04-20 ENCOUNTER — Other Ambulatory Visit: Payer: Self-pay | Admitting: Family Medicine

## 2023-04-21 NOTE — Telephone Encounter (Signed)
New Forms printed and placed in Dr. Daphine Deutscher office inbox to complete.

## 2023-04-23 ENCOUNTER — Other Ambulatory Visit (INDEPENDENT_AMBULATORY_CARE_PROVIDER_SITE_OTHER): Payer: 59

## 2023-04-23 DIAGNOSIS — R7303 Prediabetes: Secondary | ICD-10-CM

## 2023-04-23 DIAGNOSIS — I10 Essential (primary) hypertension: Secondary | ICD-10-CM | POA: Diagnosis not present

## 2023-04-23 LAB — HEMOGLOBIN A1C: Hgb A1c MFr Bld: 5.4 % (ref 4.6–6.5)

## 2023-04-23 LAB — COMPREHENSIVE METABOLIC PANEL
ALT: 24 U/L (ref 0–35)
AST: 24 U/L (ref 0–37)
Albumin: 3.9 g/dL (ref 3.5–5.2)
Alkaline Phosphatase: 45 U/L (ref 39–117)
BUN: 10 mg/dL (ref 6–23)
CO2: 29 meq/L (ref 19–32)
Calcium: 8.9 mg/dL (ref 8.4–10.5)
Chloride: 103 meq/L (ref 96–112)
Creatinine, Ser: 0.62 mg/dL (ref 0.40–1.20)
GFR: 112.69 mL/min (ref >60.00)
Glucose, Bld: 98 mg/dL (ref 70–99)
Potassium: 3.9 meq/L (ref 3.5–5.1)
Sodium: 140 meq/L (ref 135–145)
Total Bilirubin: 0.4 mg/dL (ref 0.2–1.2)
Total Protein: 6.1 g/dL (ref 6.0–8.3)

## 2023-04-23 LAB — LIPID PANEL
Cholesterol: 143 mg/dL (ref 0–200)
HDL: 69.1 mg/dL (ref >39.00)
LDL Cholesterol: 63 mg/dL (ref 0–99)
NonHDL: 73.92
Total CHOL/HDL Ratio: 2
Triglycerides: 57 mg/dL (ref 0.0–149.0)
VLDL: 11.4 mg/dL (ref 0.0–40.0)

## 2023-04-23 NOTE — Progress Notes (Signed)
No critical labs need to be addressed urgently. We will discuss labs in detail at upcoming office visit.   

## 2023-04-26 NOTE — Telephone Encounter (Signed)
Completed FMLA form faxed to [phone].

## 2023-04-30 ENCOUNTER — Ambulatory Visit (INDEPENDENT_AMBULATORY_CARE_PROVIDER_SITE_OTHER): Payer: 59 | Admitting: Family Medicine

## 2023-04-30 VITALS — BP 100/60 | HR 74 | Temp 97.9°F | Ht 63.0 in | Wt 147.5 lb

## 2023-04-30 DIAGNOSIS — I10 Essential (primary) hypertension: Secondary | ICD-10-CM | POA: Diagnosis not present

## 2023-04-30 DIAGNOSIS — Z0000 Encounter for general adult medical examination without abnormal findings: Secondary | ICD-10-CM | POA: Diagnosis not present

## 2023-04-30 DIAGNOSIS — F331 Major depressive disorder, recurrent, moderate: Secondary | ICD-10-CM

## 2023-04-30 DIAGNOSIS — K219 Gastro-esophageal reflux disease without esophagitis: Secondary | ICD-10-CM

## 2023-04-30 DIAGNOSIS — R7303 Prediabetes: Secondary | ICD-10-CM | POA: Diagnosis not present

## 2023-04-30 NOTE — Assessment & Plan Note (Signed)
Encouraged exercise, weight loss, healthy eating habits.  A1C now in normal range.

## 2023-04-30 NOTE — Assessment & Plan Note (Signed)
Chronic, well controlled  She reports working on healthy lifestyle has helped her mood along with continuing fluoxetine 40 mg daily.  She wishes to continue this current dose as she is having no side effects and it is working well.

## 2023-04-30 NOTE — Assessment & Plan Note (Signed)
Stable, chronic.  Continue current medication.    losartan 100 mg daily  amlodipine 10 mg daily Metoprolol XL 25 mg daily

## 2023-04-30 NOTE — Assessment & Plan Note (Signed)
Stable, chronic.  Continue current medication.  Pantoprazole 40 mg p.o. daily

## 2023-04-30 NOTE — Progress Notes (Signed)
Patient ID: Angela [name], female    DOB: [birth year], 37 y.o.   MRN: [id number]  This visit was conducted in person.  BP 100/60 (BP Location: Right Arm, Patient Position: Sitting, Cuff Size: Normal)   Pulse 74   Temp 97.9 F (36.6 C) (Temporal)   Ht 5\' 3"  (1.6 m)   Wt 147 lb 8 oz (66.9 kg)   SpO2 98%   BMI 26.13 kg/m    CC:  Chief Complaint  Patient presents with   Annual Exam    Subjective:   HPI: Angela [name] is a 37 y.o. female presenting on 04/30/2023 for Annual Exam  The patient presents for complete physical and review of chronic health problems. He/She also has the following acute concerns today:   MDD/GAD:  Well controlled on prozac 40 mg daily, no requiring atarax, no panic attacks.    04/13/2023    4:08 PM 08/18/2022    3:51 PM 04/28/2022   12:26 PM  Depression screen PHQ 2/9  Decreased Interest 0 0 1  Down, Depressed, Hopeless 1 0 0  PHQ - 2 Score 1 0 1  Altered sleeping 1 1 1   Tired, decreased energy 1 1 1   Change in appetite 1 1 1   Feeling bad or failure about yourself  0 0 0  Trouble concentrating 1 2 1   Moving slowly or fidgety/restless 0 0 0  Suicidal thoughts 0 0 0  PHQ-9 Score 5 5 5   Difficult doing work/chores Somewhat difficult Somewhat difficult Somewhat difficult    Reviewed labs in detail with patient. Lab Results  Component Value Date   CHOL 143 04/23/2023   HDL 69.10 04/23/2023   LDLCALC 63 04/23/2023   TRIG 57.0 04/23/2023   CHOLHDL 2 04/23/2023    Prediabetes  Lab Results  Component Value Date   HGBA1C 5.4 04/23/2023   Hypertension:  Well  controlled on losartan 100 mg daily, amlodipine 10 mg daily, metoprolol xl 25 mg daily BP Readings from Last 3 Encounters:  04/30/23 100/60  04/13/23 120/80  08/21/22 124/79  Using medication without problems or lightheadedness:  none Chest pain with exertion: none Edema:none Short of breath:none Average home BPs: Other issues:  Using  Detrol LA for OAB.  GERD:  treated with pantoprazole.   Some weight gain in last 9 months  Body mass index is 26.13 kg/m.  Wt Readings from Last 3 Encounters:  04/30/23 147 lb 8 oz (66.9 kg)  04/13/23 143 lb 4 oz (65 kg)  08/18/22 130 lb 2 oz (59 kg)  Body mass index is 26.13 kg/m.  Exercise :  regularly, but less than in the past.. plans to walk more over holidays.  Diet: healthier eating habits, no fried foods.       Relevant past medical, surgical, family and social history reviewed and updated as indicated. Interim medical history since our last visit reviewed. Allergies and medications reviewed and updated. Outpatient Medications Prior to Visit  Medication Sig Dispense Refill   amLODipine (NORVASC) 10 MG tablet TAKE 1 TABLET BY MOUTH EVERY DAY 90 tablet 3   cyclobenzaprine (FLEXERIL) 10 MG tablet Take 1 tablet (10 mg total) by mouth 2 (two) times daily as needed for muscle spasms. 20 tablet 0   FLUoxetine (PROZAC) 40 MG capsule TAKE 1 CAPSULE (40 MG TOTAL) BY MOUTH DAILY. 90 capsule 0   Galcanezumab-gnlm (EMGALITY) 120 MG/ML SOAJ 1INJECT 120MG  INTO THE SKIN EVERY 30 DAYS 3 mL 3   ibuprofen (ADVIL) 800 MG  tablet Take 1 tablet (800 mg total) by mouth 3 (three) times daily. 21 tablet 0   losartan (COZAAR) 100 MG tablet TAKE 1 TABLET BY MOUTH EVERY DAY 90 tablet 3   metoprolol succinate (TOPROL-XL) 25 MG 24 hr tablet TAKE 1 TABLET (25 MG TOTAL) BY MOUTH DAILY. 90 tablet 3   pantoprazole (PROTONIX) 40 MG tablet TAKE 1 TABLET BY MOUTH DAILY 90 tablet 3   potassium chloride SA (KLOR-CON M20) 20 MEQ tablet TAKE 1 TABLET EVERY SUNDAY, MONDAY, WEDNESDAY AND FRIDAY 48 tablet 0   tolterodine (DETROL LA) 4 MG 24 hr capsule TAKE 1 CAPSULE BY MOUTH DAILY 90 capsule 3   No facility-administered medications prior to visit.     Per HPI unless specifically indicated in ROS section below Review of Systems  Constitutional:  Negative for fatigue and fever.  HENT:  Negative for congestion.   Eyes:  Negative for pain.   Respiratory:  Negative for cough and shortness of breath.   Cardiovascular:  Negative for chest pain, palpitations and leg swelling.  Gastrointestinal:  Negative for abdominal pain.  Genitourinary:  Negative for dysuria and vaginal bleeding.  Musculoskeletal:  Negative for back pain.  Neurological:  Negative for syncope, light-headedness and headaches.  Psychiatric/Behavioral:  Negative for dysphoric mood.    Objective:  BP 100/60 (BP Location: Right Arm, Patient Position: Sitting, Cuff Size: Normal)   Pulse 74   Temp 97.9 F (36.6 C) (Temporal)   Ht 5\' 3"  (1.6 m)   Wt 147 lb 8 oz (66.9 kg)   SpO2 98%   BMI 26.13 kg/m   Wt Readings from Last 3 Encounters:  04/30/23 147 lb 8 oz (66.9 kg)  04/13/23 143 lb 4 oz (65 kg)  08/18/22 130 lb 2 oz (59 kg)      Physical Exam Vitals and nursing note reviewed.  Constitutional:      General: She is not in acute distress.    Appearance: Normal appearance. She is well-developed. She is not ill-appearing or toxic-appearing.  HENT:     Head: Normocephalic.     Right Ear: Hearing, tympanic membrane, ear canal and external ear normal.     Left Ear: Hearing, tympanic membrane, ear canal and external ear normal.     Nose: Nose normal.  Eyes:     General: Lids are normal. Lids are everted, no foreign bodies appreciated.     Conjunctiva/sclera: Conjunctivae normal.     Pupils: Pupils are equal, round, and reactive to light.  Neck:     Thyroid: No thyroid mass or thyromegaly.     Vascular: No carotid bruit.     Trachea: Trachea normal.  Cardiovascular:     Rate and Rhythm: Normal rate and regular rhythm.     Heart sounds: Normal heart sounds, S1 normal and S2 normal. No murmur heard.    No gallop.  Pulmonary:     Effort: Pulmonary effort is normal. No respiratory distress.     Breath sounds: Normal breath sounds. No wheezing, rhonchi or rales.  Abdominal:     General: Bowel sounds are normal. There is no distension or abdominal bruit.      Palpations: Abdomen is soft. There is no fluid wave or mass.     Tenderness: There is no abdominal tenderness. There is no guarding or rebound.     Hernia: No hernia is present.  Musculoskeletal:     Cervical back: Normal range of motion and neck supple.  Lymphadenopathy:     Cervical:  No cervical adenopathy.  Skin:    General: Skin is warm and dry.     Findings: No rash.  Neurological:     Mental Status: She is alert.     Cranial Nerves: No cranial nerve deficit.     Sensory: No sensory deficit.  Psychiatric:        Mood and Affect: Mood is not anxious or depressed.        Speech: Speech normal.        Behavior: Behavior normal. Behavior is cooperative.        Judgment: Judgment normal.       Results for orders placed or performed in visit on 04/23/23  Lipid panel   Collection Time: 04/23/23  8:10 AM  Result Value Ref Range   Cholesterol 143 0 - 200 mg/dL   Triglycerides 02.7 0.0 - 149.0 mg/dL   HDL 25.36 >64.40 mg/dL   VLDL 34.7 0.0 - 42.5 mg/dL   LDL Cholesterol 63 0 - 99 mg/dL   Total CHOL/HDL Ratio 2    NonHDL 73.92   Hemoglobin A1c   Collection Time: 04/23/23  8:10 AM  Result Value Ref Range   Hgb A1c MFr Bld 5.4 4.6 - 6.5 %  Comprehensive metabolic panel   Collection Time: 04/23/23  8:10 AM  Result Value Ref Range   Sodium 140 135 - 145 mEq/L   Potassium 3.9 3.5 - 5.1 mEq/L   Chloride 103 96 - 112 mEq/L   CO2 29 19 - 32 mEq/L   Glucose, Bld 98 70 - 99 mg/dL   BUN 10 6 - 23 mg/dL   Creatinine, Ser 9.56 0.40 - 1.20 mg/dL   Total Bilirubin 0.4 0.2 - 1.2 mg/dL   Alkaline Phosphatase 45 39 - 117 U/L   AST 24 0 - 37 U/L   ALT 24 0 - 35 U/L   Total Protein 6.1 6.0 - 8.3 g/dL   Albumin 3.9 3.5 - 5.2 g/dL   GFR 387.56 >43.32 mL/min   Calcium 8.9 8.4 - 10.5 mg/dL     COVID 19 screen:  No recent travel or known exposure to COVID19 The patient denies respiratory symptoms of COVID 19 at this time. The importance of social distancing was discussed today.    Assessment and Plan The patient's preventative maintenance and recommended screening tests for an annual wellness exam were reviewed in full today. Brought up to date unless services declined.  Counselled on the importance of diet, exercise, and its role in overall health and mortality. The patient's FH and SH was reviewed, including their home life, tobacco status, and drug and alcohol status.    Vaccines: Uptodate flu  vaccine  and  uptodateTdap . S/P COVID x 3 Pelvic: DVE/pap; last pap 2022 nml, no high risk HPV, repeat in 5 years. No early family history of colon, uterine , ovarian or breast cancer.  Nonsmoker  ETOH/ drugs: weekends (3-4 shots) /none HIV/STD: neg 2013   Hep C:  done  Problem List Items Addressed This Visit     GERD (gastroesophageal reflux disease)   Stable, chronic.  Continue current medication.   Pantoprazole 40 mg p.o. daily      HTN (hypertension), benign   Stable, chronic.  Continue current medication.    losartan 100 mg daily  amlodipine 10 mg daily Metoprolol XL 25 mg daily      MDD (major depressive disorder), recurrent episode, moderate (HCC)   Chronic, well controlled  She reports working on healthy lifestyle  has helped her mood along with continuing fluoxetine 40 mg daily.  She wishes to continue this current dose as she is having no side effects and it is working well.      Prediabetes   Encouraged exercise, weight loss, healthy eating habits.  A1C now in normal range.      Other Visit Diagnoses       Routine general medical examination at a health care facility    -  Primary        [name], MD

## 2023-05-27 ENCOUNTER — Other Ambulatory Visit: Payer: Self-pay | Admitting: Family Medicine

## 2023-06-18 ENCOUNTER — Other Ambulatory Visit: Payer: Self-pay | Admitting: Family Medicine

## 2023-07-28 ENCOUNTER — Encounter: Payer: Self-pay | Admitting: Family Medicine

## 2023-07-30 NOTE — Telephone Encounter (Signed)
 Form placed in your box for review and signature

## 2023-08-07 DIAGNOSIS — N7689 Other specified inflammation of vagina and vulva: Secondary | ICD-10-CM | POA: Diagnosis not present

## 2023-09-20 ENCOUNTER — Other Ambulatory Visit: Payer: Self-pay | Admitting: Family Medicine

## 2023-09-21 MED ORDER — POTASSIUM CHLORIDE CRYS ER 20 MEQ PO TBCR: EXTENDED_RELEASE_TABLET | ORAL | 1 refills | Status: DC

## 2023-10-19 ENCOUNTER — Other Ambulatory Visit: Payer: Self-pay | Admitting: Family Medicine

## 2023-11-01 ENCOUNTER — Telehealth: Admitting: Neurology

## 2023-11-01 ENCOUNTER — Telehealth: Payer: Self-pay | Admitting: Neurology

## 2023-11-01 ENCOUNTER — Encounter: Payer: Self-pay | Admitting: Neurology

## 2023-11-01 DIAGNOSIS — G43709 Chronic migraine without aura, not intractable, without status migrainosus: Secondary | ICD-10-CM | POA: Diagnosis not present

## 2023-11-01 MED ORDER — EMGALITY 120 MG/ML ~~LOC~~ SOAJ: 120.0000 mg | SUBCUTANEOUS | 0 refills | Status: DC

## 2023-11-01 MED ORDER — EMGALITY 120 MG/ML ~~LOC~~ SOAJ: 240.0000 mg | Freq: Once | SUBCUTANEOUS | 0 refills | Status: DC

## 2023-11-01 NOTE — Addendum Note (Signed)
 Addended by: Kiah Keay B on: 11/01/2023 03:09 PM   Modules accepted: Orders, Level of Service

## 2023-11-01 NOTE — Telephone Encounter (Signed)
 Scheduled VV with Amy for 10/31/24 @ 8:45 am.

## 2023-11-01 NOTE — Telephone Encounter (Signed)
 Plesae schedule her for video follow up with amy lomax in one year for med refill thanks

## 2023-11-01 NOTE — Patient Instructions (Addendum)
 Restart emgality   Galcanezumab  Injection What is this medication? GALCANEZUMAB  (gal ka NEZ ue mab) prevents migraines. It works by blocking a substance in the body that causes migraines. It may also be used to treat cluster headaches. It is a monoclonal antibody. This medicine may be used for other purposes; ask your health care provider or pharmacist if you have questions. COMMON BRAND NAME(S): Emgality  What should I tell my care team before I take this medication? They need to know if you have any of these conditions: An unusual or allergic reaction to galcanezumab , other medications, foods, dyes, or preservatives Pregnant or trying to get pregnant Breast-feeding How should I use this medication? This medication is injected under the skin. You will be taught how to prepare and give it. Take it as directed on the prescription label. Keep taking it unless your care team tells you to stop. It is important that you put your used needles and syringes in a special sharps container. Do not put them in a trash can. If you do not have a sharps container, call your pharmacist or care team to get one. Talk to your care team about the use of this medication in children. Special care may be needed. Overdosage: If you think you have taken too much of this medicine contact a poison control center or emergency room at once. NOTE: This medicine is only for you. Do not share this medicine with others. What if I miss a dose? If you miss a dose, take it as soon as you can. If it is almost time for your next dose, take only that dose. Do not take double or extra doses. What may interact with this medication? Interactions are not expected. This list may not describe all possible interactions. Give your health care provider a list of all the medicines, herbs, non-prescription drugs, or dietary supplements you use. Also tell them if you smoke, drink alcohol, or use illegal drugs. Some items may interact with your  medicine. What should I watch for while using this medication? Visit your care team for regular checks on your progress. Tell your care team if your symptoms do not start to get better or if they get worse. What side effects may I notice from receiving this medication? Side effects that you should report to your care team as soon as possible: Allergic reactions or angioedema--skin rash, itching or hives, swelling of the face, eyes, lips, tongue, arms, or legs, trouble swallowing or breathing Side effects that usually do not require medical attention (report to your care team if they continue or are bothersome): Pain, redness, or irritation at injection site This list may not describe all possible side effects. Call your doctor for medical advice about side effects. You may report side effects to FDA at 1-800-FDA-1088. Where should I keep my medication? Keep out of the reach of children and pets. Store in a refrigerator or at room temperature between 20 and 25 degrees C (68 and 77 degrees F). Refrigeration (preferred): Store in the refrigerator. Do not freeze. Keep in the original container until you are ready to take it. Remove the dose from the carton about 30 minutes before it is time for you to use it. If the dose is not used, it may be stored in original container at room temperature for 7 days. Get rid of any unused medication after the expiration date. Room Temperature: This medication may be stored at room temperature for up to 7 days. Keep it in the original  container. Protect from light until time of use. If it is stored at room temperature, get rid of any unused medication after 7 days or after it expires, whichever is first. To get rid of medications that are no longer needed or have expired: Take the medication to a medication take-back program. Check with your pharmacy or law enforcement to find a location. If you cannot return the medication, ask your pharmacist or care team how to get  rid of this medication safely. NOTE: This sheet is a summary. It may not cover all possible information. If you have questions about this medicine, talk to your doctor, pharmacist, or health care provider.  2024 [employer]/Gold Standard (2021-06-23 00:00:00)

## 2023-11-01 NOTE — Progress Notes (Addendum)
 [facility]    Provider:  Dr [name] Referring Provider: Avelina Greig BRAVO, MD Primary Care Physician:  Avelina Greig BRAVO, MD CC:  Migraines   Virtual Visit via Video Note  I connected with Angela [name] on 11/01/23 at  9:00 AM EDT by a video enabled telemedicine application and verified that I am speaking with the correct person using two identifiers.  Location: Patient: home Provider: office   I discussed the limitations of evaluation and management by telemedicine and the availability of in person appointments. The patient expressed understanding and agreed to proceed.   Follow Up Instructions:    I discussed the assessment and treatment plan with the patient. The patient was provided an opportunity to ask questions and all were answered. The patient agreed with the plan and demonstrated an understanding of the instructions.   The patient was advised to call back or seek an in-person evaluation if the symptoms worsen or if the condition fails to improve as anticipated.  I provided 30 minutes of non-face-to-face time during this encounter.   Angela KATHEE Ines, MD  11/01/2023: Patient has been seen yearly and followed by NP as below, today following with me. She has done very well on emgality . She has not been on the Emgality  for a year, the year has not gone well but still not as bad as it used to be. The migraines are worsening. She wants to go back on the emgality . Currently she has > 8 moderate to severe migraine days a month and > 15 total headache days a month. On the Emgality  did spectacular, she had 1-2 migraines a month and always at the end the last week when it started to wear off, didn't need any acute management. Migraines are worse, in freq and severity, acutely, ongoing at the higher frequency for > 6 months, blurry vision with the bad migraines which is not uncommon, would like to go back on the Emgality .  Does so well on Emgality  doesn't even need  an acute medication. no medication overuse, no aura. The migraines are Pulsating/pounding/throbbing, light and sound sensitivity, nausea, vomiting, hurts to move, migraines are moderate to severe and can last 8-24 hours or more, unilateral but can spread to be holocephalic. They are significantly affecting quality of life with work, family. She did so well on Emgality  will put her back on it.  Signed her up for the copay card with patient and placed it in the prescription. Atthis time, she did so well on Emgality  in the past will hold off on MRi brain but low threshold   Patient complains of symptoms per HPI as well as the following symptoms: none . Pertinent negatives and positives per HPI. All others negative     12/03/21 ALL:  Angela [name] returns for follow up. She continues Emgality  monthly and phenergan  as needed. Migraines remain well managed. She does feel that Emgality  wears off toward the end of the month. She may have 1-2 migraines but easily aborted with phenergan  and rest. She does not usually need OTC analgesics.   12/03/2020 ALL: Angela [name] returns for follow up for migraines. She continues Emgality  and tolerating well. She does have more headaches just prior to next injection. She has a headache band she uses in combination with resting in a dark room for abortive therapy that works well. She knows stress is most common trigger for her. She is feeling well, today.   12/04/2019 ALL:  Angela [name] is a 38 y.o. female here  today for follow up for migraines. She continues Emgality  monthly. She feels that she is doing well. She does note increase in headache frequency the week prior to next injection. She is able to use complementary therapies to abort headache. She is unable to tolerate triptan medications. She rarely uses phenergan .   09/2017 Dr. Ines: Her headaches are much improved.  2-3 migraines a month baseline was > 15. She is thrilled with Ajovy . No side effects. Will continue.  Discussed other options and the other cgrp meds, she is doing so well wont change a thing. Discussed acute management as well.   Interval history: Discussion today with patient regarding Botox versus medications versus the new CGRP medications.  Patient would like to try the new CGRP medications. Provided sample today of Ajovy .   HPI:  Angela [name] is a 38 y.o. female here as a referral from Dr. Avelina for migraines. Past medical history of migraines, hypertension, anemia. Sister with migraines. Headaches worsening and started becoming very intense with nausea, vomiting, dizziness, blurry vision. Started in April with 2 weeks of hedaches. No inciting events or head trauma. There was stress at work however. Starts in the temples and behind the eys, can be unilateral and also in the back, pounding she can feel her veins and eyes about to pop out. Light and sound bothers her. She has nausea and vomiting. She has ear pain with the headaches but ear exam normal. She sees floaters sometime but no aura. She has 25 headache days a month, 15 are migrainous and can last up to 24 hours. She sometimes has morning headaches. No other focal neurologic deficits, associated symptoms, inciting events or modifiable factors. She was prescribed topiramate  but never picked it up bc she read the side effects. She hs her fallopian tubes removed, no more children. No other focal neurologic deficits, associated symptoms, inciting events or modifiable factors.   Meds tried: Imitrex  (made her feel weird), flexeril , Labetalol  and Topiramate     Reviewed notes, labs and imaging from outside physicians, which showed:    hgba1c 5.6, CMP nml 09/2016   Reviewed notes, patient has been to primary care multiple times and a migraines. Treated with Zofran . She reported using ibuprofen  and Imitrex . Acute headaches resolved with Imitrex . She reported headaches every other day in April of this year. Feels a band around her head that is  squeezing, associated with nausea, blurred vision and dizziness, photo and phonophobia associated, also noted ear pressure. Triggers include increase in stress. Exam noted that there was no papilledema. She was started on Topamax  at bedtime. She did not fill this given fear of side effects. She is using Imitrex  for severe headaches and using ibuprofen  as well as. The month with headaches usually 1-2 hours but some up to 6-12 hours.       Review of Systems: Patient complains of symptoms per HPI as well as the following symptoms: no CP, no SOB. Pertinent negatives and positives per HPI. All others negative.   Social History   Socioeconomic History   Marital status: Married    Spouse name: HASAN   Number of children: 2   Years of education: 14+   Highest education level: Not on file  Occupational History   Occupation: Retail banker: [employer]  Tobacco Use   Smoking status: Never   Smokeless tobacco: Never  Vaping Use   Vaping status: Never Used  Substance and Sexual Activity   Alcohol use: Yes  Alcohol/week: 3.0 standard drinks of alcohol    Types: 2 Glasses of wine, 1 Shots of liquor per week    Comment: weekends   Drug use: No   Sexual activity: Yes    Partners: Male    Birth control/protection: Other-see comments    Comment: Ablation  Other Topics Concern   Not on file  Social History Narrative   Lives at home w/ her husband and children   Right-handed   Caffeine: 1 cup coffee daily   Social Drivers of Corporate investment banker Strain: Not on file  Food Insecurity: Not on file  Transportation Needs: Not on file  Physical Activity: Not on file  Stress: Not on file  Social Connections: Not on file  Intimate Partner Violence: Not on file    Family History  Problem Relation Age of Onset   Anesthesia problems Mother        NAUSEA AND VOMITING   Thyroid  disease Mother    Fibromyalgia Mother    Sarcoidosis Mother    Depression Mother     Other Mother        VARICOSE VEINS   Anesthesia problems Other    Diabetes Father    Hypertension Sister    Arthritis Maternal Grandmother    Heart disease Maternal Grandmother    Hypertension Maternal Grandmother    Kidney disease Maternal Grandmother        FAILURE   Prostate cancer Maternal Grandfather    Depression Maternal Aunt    Sarcoidosis Maternal Aunt    Asthma Cousin    Liver disease Cousin        requiring liver transplant- unknown as to what type of liver disease   Colon cancer Neg Hx    Esophageal cancer Neg Hx    Pancreatic cancer Neg Hx    Stomach cancer Neg Hx    Inflammatory bowel disease Neg Hx    Rectal cancer Neg Hx     Past Medical History:  Diagnosis Date   Abnormal Pap smear AGE 30   COLPO LAST PAP 08/2011   Allergy    Anemia    Complication of anesthesia    NAUSEA; VOMITING; ITCHING; DIFFICULTY BREATHING   Genital herpes    GERD (gastroesophageal reflux disease) AGE 29   tums prn   History of chicken pox    Hypertension    Infection AGE 30   CHLAMYDIA; GONORRHEA   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   Infection    BV   Migraines    Ovarian cyst rupture    PONV (postoperative nausea and vomiting)    Preterm labor    Urinary incontinence     Past Surgical History:  Procedure Laterality Date   CERVIX LESION DESTRUCTION     CESAREAN SECTION  2005, 2010   x2   CESAREAN SECTION WITH BILATERAL TUBAL LIGATION Bilateral 11/15/2012   Procedure: REPEAT CESAREAN SECTION WITH BILATERAL TUBAL LIGATION;  Surgeon: Rome Rigg, MD;  Location: [facility];  Service: Obstetrics;  Laterality: Bilateral;   COLPOSCOPY     UNILATERAL SALPINGECTOMY Right 11/15/2012   Procedure: UNILATERAL SALPINGECTOMY;  Surgeon: Rome Rigg, MD;  Location: [facility];  Service: Obstetrics;  Laterality: Right;   WISDOM TOOTH EXTRACTION      Current Outpatient Medications  Medication Sig Dispense Refill   amLODipine  (NORVASC ) 10 MG tablet TAKE 1 TABLET BY MOUTH EVERY DAY 90 tablet 3    cyclobenzaprine  (FLEXERIL ) 10 MG tablet Take 1 tablet (10 mg total)  by mouth 2 (two) times daily as needed for muscle spasms. 20 tablet 0   FLUoxetine  (PROZAC ) 40 MG capsule TAKE 1 CAPSULE (40 MG TOTAL) BY MOUTH DAILY. 90 capsule 1   Galcanezumab -gnlm (EMGALITY ) 120 MG/ML SOAJ Inject 120 mg into the skin every 30 (thirty) days. Please fill loading dose first(2 injections first mont, separate prescription). Please run copay card: Rx BIN: [id number]  PCN [id number]  GRP [id number] ID [id number] Expires:05/10/2024 2 mL 0   ibuprofen  (ADVIL ) 800 MG tablet Take 1 tablet (800 mg total) by mouth 3 (three) times daily. 21 tablet 0   losartan  (COZAAR ) 100 MG tablet TAKE 1 TABLET BY MOUTH EVERY DAY 90 tablet 3   metoprolol  succinate (TOPROL -XL) 25 MG 24 hr tablet TAKE 1 TABLET (25 MG TOTAL) BY MOUTH DAILY. 90 tablet 3   pantoprazole  (PROTONIX ) 40 MG tablet TAKE 1 TABLET BY MOUTH DAILY 90 tablet 3   potassium chloride  SA (KLOR-CON  M20) 20 MEQ tablet TAKE 1 TABLET EVERY SUNDAY, MONDAY, WEDNESDAY AND FRIDAY 48 tablet 1   tolterodine  (DETROL  LA) 4 MG 24 hr capsule TAKE 1 CAPSULE BY MOUTH DAILY 90 capsule 3   No current facility-administered medications for this visit.    Allergies as of 11/01/2023 - Review Complete 11/01/2023  Allergen Reaction Noted   Amoxicillin Hives 12/01/2010   Dexilant  [dexlansoprazole ] Nausea Only and Other (See Comments) 04/12/2018    Vitals: There were no vitals taken for this visit. Last Weight:  Wt Readings from Last 1 Encounters:  04/30/23 147 lb 8 oz (66.9 kg)   Last Height:   Ht Readings from Last 1 Encounters:  04/30/23 5' 3 (1.6 m)     Physical exam: Exam: Gen: NAD, conversant      CV: No palpitations or chest pain or SOB. VS: Breathing at a normal rate. Weight appears within normal limits. Not febrile. Eyes: Conjunctivae clear without exudates or hemorrhage  Neuro: Detailed Neurologic Exam  Speech:    Speech is normal; fluent and spontaneous with normal  comprehension.  Cognition:    The patient is oriented to person, place, and time;     recent and remote memory intact;     language fluent;     normal attention, concentration, fund of knowledge Cranial Nerves:    The pupils are equal, round, and reactive to light. Visual fields are full Extraocular movements are intact.  The face is symmetric with normal sensation. The palate elevates in the midline. Hearing intact. Voice is normal. Shoulder shrug is normal. The tongue has normal motion without fasciculations.   Coordination: normal  Gait:    No abnormalities noted or reported  Motor Observation:   no involuntary movements noted. Tone:    Appears normal  Posture:    Posture is normal. normal erect    Strength:    Strength is anti-gravity and symmetric in the upper and lower limbs.      Sensation: intact to LT, no reports of numbness or tingling or paresthesias        Assessment/Plan:  This is a 38 year old patient with chronic migraines without aura. Currently she has > 8 moderate to severe migraine days a month and > 15 total headache days a month. On the Emgality  did spectacular, she had 1-2 migraines a month and always at the end the last week when it started to wear off, didn't need any acute management. Migraines are worse, in freq and severity, acutely, ongoing at the higher frequency for > 6 months, blurry vision  with the bad migraines which is not uncommon, would like to go back on the Emgality .  Does so well on Emgality  doesn't even need an acute medication  Discussed options, At this time, she did so well on Emgality  in the past will hold off on MRi brain but low threshold  Emgality , discussed first month is 2 injections and every month thereafter is one   Please run copay card(signed her up for it with patient, received her approval): Rx BIN: [id number]  PCN:.[id number]  GRP [id number] ID [id number] Expires:05/10/2024  Meds ordered this encounter  Medications   DISCONTD:  Galcanezumab -gnlm (EMGALITY ) 120 MG/ML SOAJ    Sig: Inject 240 mg into the skin once for 1 dose. THIS IS THE LOADING DOSE first moth. Please run copay card: Rx BIN: [id number]  PCN:.[id number]  GRP [id number] ID [id number] Expires:05/10/2024    Dispense:  2 mL    Refill:  0    Please run copay card: Rx BIN: [id number]  PCN:.[id number]  GRP [id number] ID [id number] Expires:05/10/2024   Galcanezumab -gnlm (EMGALITY ) 120 MG/ML SOAJ    Sig: Inject 120 mg into the skin every 30 (thirty) days. Please fill loading dose first(2 injections first mont, separate prescription). Please run copay card: Rx BIN: [id number]  PCN PDMI  GRP [id number] ID [id number] Expires:05/10/2024    Dispense:  2 mL    Refill:  0    Please run copay card: Rx BIN: [id number]  PCN:.[id number]  GRP [id number] ID [id number] Expires:05/10/2024   Sent message to team to call her for a follow up appointment   Angela Epp, MD  [facility] [address] [place], [place] [address]  Phone [phone] Fax [phone]

## 2023-11-03 ENCOUNTER — Encounter: Payer: Self-pay | Admitting: Family Medicine

## 2023-11-04 ENCOUNTER — Telehealth: Payer: Self-pay | Admitting: Neurology

## 2023-11-04 NOTE — Telephone Encounter (Signed)
 Received FMLA PPW for pt from [employer], called patient and she states this was supposed to go to PCP, not us . She is going to have an appt with them next week and is taking the paperwork there. Not need for us  to complete for patient.

## 2023-11-05 ENCOUNTER — Telehealth: Payer: Self-pay

## 2023-11-05 NOTE — Telephone Encounter (Signed)
 Received attending physician statement for disability forms. Placed in providers box for review.

## 2023-11-05 NOTE — Telephone Encounter (Signed)
 FYI: Will complete FMLA paperwork after patient's upcoming office visit with me to discuss this November 09, 2023

## 2023-11-09 ENCOUNTER — Encounter: Payer: Self-pay | Admitting: Family Medicine

## 2023-11-09 ENCOUNTER — Ambulatory Visit: Admitting: Family Medicine

## 2023-11-09 VITALS — BP 102/78 | HR 82 | Temp 98.4°F | Ht 63.0 in | Wt 141.5 lb

## 2023-11-09 DIAGNOSIS — F411 Generalized anxiety disorder: Secondary | ICD-10-CM | POA: Diagnosis not present

## 2023-11-09 DIAGNOSIS — F5104 Psychophysiologic insomnia: Secondary | ICD-10-CM

## 2023-11-09 DIAGNOSIS — F331 Major depressive disorder, recurrent, moderate: Secondary | ICD-10-CM

## 2023-11-09 DIAGNOSIS — Z0279 Encounter for issue of other medical certificate: Secondary | ICD-10-CM

## 2023-11-09 MED ORDER — TRAZODONE HCL 50 MG PO TABS: 25.0000 mg | ORAL_TABLET | Freq: Every evening | ORAL | 3 refills | Status: AC | PRN

## 2023-11-09 NOTE — Progress Notes (Signed)
 Patient ID: Angela [name], female    DOB: Oct 14, 1985, 38 y.o.   MRN: [id number]  This visit was conducted in person.  BP 102/78   Pulse 82   Temp 98.4 F (36.9 C) (Temporal)   Ht 5' 3 (1.6 m)   Wt 141 lb 8 oz (64.2 kg)   BMI 25.07 kg/m    CC:  Chief Complaint  Patient presents with   Depression   Anxiety   Form Completion    STD    Subjective:   HPI: [name] is a 38 y.o. female presenting on 11/09/2023 for Depression, Anxiety, and Form Completion (STD)  MDD, GAD: She had been doing well with her mood  on prozac  40 mg daily until  last few month.  No SE, taking regularly.   She has noted gradual  increasing  worry, increased tearful ness, appetite decrease, poor sleep ( both falling asleep and staying asleep. She has not  been doing counseling lately... planning to start back, 20 session covered at work.  She has noted mood interfering with her job.. decreased focus, concentration, decreased motivation.  Trouble completing task.  Foggy headed feeling.  She has had increase in stressful events.. divorce final, boyfriend broke up with her, needs place to stay.. looking for an apartment. Single mother.  She has not been at work since  11/01/2023.  Plans to return 12/01/2023   Also has had increase in migraine, occurring 4 times a week.. neuro has provided Emgality .     11/09/2023    9:06 AM 04/13/2023    4:08 PM 08/18/2022    3:51 PM 04/28/2022   12:27 PM  GAD 7 : Generalized Anxiety Score  Nervous, Anxious, on Edge 1 1 1 1   Control/stop worrying 1 1 0 0  Worry too much - different things 1 1 0 1  Trouble relaxing 1 1 0 0  Restless 1 0 0 0  Easily annoyed or irritable 2 1 1 1   Afraid - awful might happen 1 1 1 1   Total GAD 7 Score 8 6 3 4   Anxiety Difficulty Very difficult Somewhat difficult Somewhat difficult Somewhat difficult       11/09/2023    9:06 AM 04/13/2023    4:08 PM 08/18/2022    3:51 PM  Depression screen PHQ 2/9  Decreased Interest 2  0 0  Down, Depressed, Hopeless 1 1 0  PHQ - 2 Score 3 1 0  Altered sleeping 2 1 1   Tired, decreased energy 2 1 1   Change in appetite 2 1 1   Feeling bad or failure about yourself  0 0 0  Trouble concentrating 1 1 2   Moving slowly or fidgety/restless 0 0 0  Suicidal thoughts 0 0 0  PHQ-9 Score 10 5 5   Difficult doing work/chores Very difficult Somewhat difficult Somewhat difficult         Relevant past medical, surgical, family and social history reviewed and updated as indicated. Interim medical history since our last visit reviewed. Allergies and medications reviewed and updated. Outpatient Medications Prior to Visit  Medication Sig Dispense Refill   amLODipine  (NORVASC ) 10 MG tablet TAKE 1 TABLET BY MOUTH EVERY DAY 90 tablet 3   cyclobenzaprine  (FLEXERIL ) 10 MG tablet Take 1 tablet (10 mg total) by mouth 2 (two) times daily as needed for muscle spasms. 20 tablet 0   FLUoxetine  (PROZAC ) 40 MG capsule TAKE 1 CAPSULE (40 MG TOTAL) BY MOUTH DAILY. 90 capsule 1  Galcanezumab -gnlm (EMGALITY ) 120 MG/ML SOAJ Inject 120 mg into the skin every 30 (thirty) days. Please fill loading dose first(2 injections first mont, separate prescription). Please run copay card: Rx BIN: [id number]  PCN [id number]  GRP [id number] ID [id number] Expires:05/10/2024 2 mL 0   ibuprofen  (ADVIL ) 800 MG tablet Take 1 tablet (800 mg total) by mouth 3 (three) times daily. 21 tablet 0   losartan  (COZAAR ) 100 MG tablet TAKE 1 TABLET BY MOUTH EVERY DAY 90 tablet 3   metoprolol  succinate (TOPROL -XL) 25 MG 24 hr tablet TAKE 1 TABLET (25 MG TOTAL) BY MOUTH DAILY. 90 tablet 3   pantoprazole  (PROTONIX ) 40 MG tablet TAKE 1 TABLET BY MOUTH DAILY 90 tablet 3   potassium chloride  SA (KLOR-CON  M20) 20 MEQ tablet TAKE 1 TABLET EVERY SUNDAY, MONDAY, WEDNESDAY AND FRIDAY 48 tablet 1   tolterodine  (DETROL  LA) 4 MG 24 hr capsule TAKE 1 CAPSULE BY MOUTH DAILY 90 capsule 3   No facility-administered medications prior to visit.     Per HPI unless  specifically indicated in ROS section below Review of Systems  Constitutional:  Negative for fatigue and fever.  HENT:  Negative for congestion.   Eyes:  Negative for pain.  Respiratory:  Negative for cough and shortness of breath.   Cardiovascular:  Negative for chest pain, palpitations and leg swelling.  Gastrointestinal:  Negative for abdominal pain.  Genitourinary:  Negative for dysuria and vaginal bleeding.  Musculoskeletal:  Negative for back pain.  Neurological:  Negative for syncope, light-headedness and headaches.  Psychiatric/Behavioral:  Negative for dysphoric mood.    Objective:  BP 102/78   Pulse 82   Temp 98.4 F (36.9 C) (Temporal)   Ht 5' 3 (1.6 m)   Wt 141 lb 8 oz (64.2 kg)   BMI 25.07 kg/m   Wt Readings from Last 3 Encounters:  11/09/23 141 lb 8 oz (64.2 kg)  04/30/23 147 lb 8 oz (66.9 kg)  04/13/23 143 lb 4 oz (65 kg)      Physical Exam Constitutional:      General: She is not in acute distress.    Appearance: Normal appearance. She is well-developed. She is not ill-appearing or toxic-appearing.  HENT:     Head: Normocephalic.     Right Ear: Hearing, tympanic membrane, ear canal and external ear normal. Tympanic membrane is not erythematous, retracted or bulging.     Left Ear: Hearing, tympanic membrane, ear canal and external ear normal. Tympanic membrane is not erythematous, retracted or bulging.     Nose: No mucosal edema or rhinorrhea.     Right Sinus: No maxillary sinus tenderness or frontal sinus tenderness.     Left Sinus: No maxillary sinus tenderness or frontal sinus tenderness.     Mouth/Throat:     Pharynx: Uvula midline.   Eyes:     General: Lids are normal. Lids are everted, no foreign bodies appreciated.     Conjunctiva/sclera: Conjunctivae normal.     Pupils: Pupils are equal, round, and reactive to light.   Neck:     Thyroid : No thyroid  mass or thyromegaly.     Vascular: No carotid bruit.     Trachea: Trachea normal.    Cardiovascular:     Rate and Rhythm: Normal rate and regular rhythm.     Pulses: Normal pulses.     Heart sounds: Normal heart sounds, S1 normal and S2 normal. No murmur heard.    No friction rub. No gallop.  Pulmonary:  Effort: Pulmonary effort is normal. No tachypnea or respiratory distress.     Breath sounds: Normal breath sounds. No decreased breath sounds, wheezing, rhonchi or rales.  Abdominal:     General: Bowel sounds are normal.     Palpations: Abdomen is soft.     Tenderness: There is no abdominal tenderness.   Musculoskeletal:     Cervical back: Normal range of motion and neck supple.   Skin:    General: Skin is warm and dry.     Findings: No rash.   Neurological:     Mental Status: She is alert.   Psychiatric:        Attention and Perception: Attention normal.        Mood and Affect: Mood is depressed. Mood is not anxious.        Speech: Speech normal.        Behavior: Behavior is withdrawn. Behavior is cooperative.        Thought Content: Thought content normal.        Judgment: Judgment normal.       Results for orders placed or performed in visit on 04/23/23  Lipid panel   Collection Time: 04/23/23  8:10 AM  Result Value Ref Range   Cholesterol 143 0 - 200 mg/dL   Triglycerides 42.9 0.0 - 149.0 mg/dL   HDL 30.89 >60.99 mg/dL   VLDL 88.5 0.0 - 59.9 mg/dL   LDL Cholesterol 63 0 - 99 mg/dL   Total CHOL/HDL Ratio 2    NonHDL 73.92   Hemoglobin A1c   Collection Time: 04/23/23  8:10 AM  Result Value Ref Range   Hgb A1c MFr Bld 5.4 4.6 - 6.5 %  Comprehensive metabolic panel   Collection Time: 04/23/23  8:10 AM  Result Value Ref Range   Sodium 140 135 - 145 mEq/L   Potassium 3.9 3.5 - 5.1 mEq/L   Chloride 103 96 - 112 mEq/L   CO2 29 19 - 32 mEq/L   Glucose, Bld 98 70 - 99 mg/dL   BUN 10 6 - 23 mg/dL   Creatinine, Ser 9.37 0.40 - 1.20 mg/dL   Total Bilirubin 0.4 0.2 - 1.2 mg/dL   Alkaline Phosphatase 45 39 - 117 U/L   AST 24 0 - 37 U/L   ALT  24 0 - 35 U/L   Total Protein 6.1 6.0 - 8.3 g/dL   Albumin 3.9 3.5 - 5.2 g/dL   GFR 887.30 >39.99 mL/min   Calcium 8.9 8.4 - 10.5 mg/dL    Assessment and Plan  MDD (major depressive disorder), recurrent episode, moderate (HCC) Assessment & Plan: Chronic, poor control.  Recent worsening with life events and increase in stressors. She is planning to return to counseling at increased frequency in the next few weeks. She feels Prozac  40 mg daily is working well for her and would like to continue at this dose.  Given she is having additional issues with sleep at night we will add trazodone  25 to 50 mg p.o. nightly. Recommended stress reduction, relaxation and regular exercise to assist with mood. Plan FMLA leave from work from June 23 to December 01, 2023.  She will follow-up in approximately 4 weeks for reassessment on readiness to return to work.   GAD (generalized anxiety disorder) Assessment & Plan: Chronic, poor control   Chronic insomnia  Other orders -     traZODone  HCl; Take 0.5-1 tablets (25-50 mg total) by mouth at bedtime as needed for sleep.  Dispense: 30  tablet; Refill: 3    Return in about 17 days (around 11/26/2023) for follow up mood.   Greig Ring, MD

## 2023-11-09 NOTE — Assessment & Plan Note (Signed)
Chronic, poor control. 

## 2023-11-09 NOTE — Telephone Encounter (Signed)
 Received completed forms from provider and faxed to [phone]. Copy sent to scan. Reached out to patient regarding her copy, she asks that I mail it to the address on file. Copy put in the mail 7.1.25

## 2023-11-09 NOTE — Assessment & Plan Note (Signed)
 Chronic, poor control.  Recent worsening with life events and increase in stressors. She is planning to return to counseling at increased frequency in the next few weeks. She feels Prozac  40 mg daily is working well for her and would like to continue at this dose.  Given she is having additional issues with sleep at night we will add trazodone  25 to 50 mg p.o. nightly. Recommended stress reduction, relaxation and regular exercise to assist with mood. Plan FMLA leave from work from June 23 to December 01, 2023.  She will follow-up in approximately 4 weeks for reassessment on readiness to return to work.

## 2023-11-29 ENCOUNTER — Encounter: Payer: Self-pay | Admitting: Family Medicine

## 2023-12-13 NOTE — Telephone Encounter (Signed)
 Completed updated forms received. Forms faxed to [phone] Copy sent to scan Notified patient via Mychart that her copy was available to pick up at the front desk.

## 2023-12-16 ENCOUNTER — Encounter: Payer: Self-pay | Admitting: Family Medicine

## 2023-12-16 NOTE — Telephone Encounter (Signed)
 Form printed and placed in Dr. Sherrel office in box to complete.  Please let me know if patient needs to schedule an appointment prior to completing form.

## 2023-12-29 ENCOUNTER — Encounter: Payer: Self-pay | Admitting: Family Medicine

## 2024-01-03 NOTE — Telephone Encounter (Signed)
 I have received the letter from the [facility] requesting the following information.  -Medical records including Post-partum office visit notes, any treatment, procedures, physical therapy, test results, therapy notes, and admission/discharge summaries from all treating providers from November 10, 2023. **I have placed the letter in the medical records file at the front desk for them to gather the requested information for the patient.  -A list of of restrictions and limitations from provider  All requested information is due by 9.5.25  Letter placed in providers box for review.

## 2024-01-04 ENCOUNTER — Encounter: Payer: Self-pay | Admitting: Family Medicine

## 2024-01-05 NOTE — Telephone Encounter (Signed)
 Received letter from provider providing restrictions and limitations, I have also printed the providers note from 7.1.25 office visit as requested. Both have been faxed to [employer] at  [phone] at the request of the patient.  I informed patient that notes/appointments/treatment from other providers would need to go through medical records, and that I had put her letter requesting additional records in our to scan medical record folder for them to coordinate.  Patient was advised to reach back out to our office if she had any further questions.  Copy of provider letter sent to scan.

## 2024-03-06 ENCOUNTER — Encounter: Payer: Self-pay | Admitting: Family Medicine

## 2024-03-07 ENCOUNTER — Telehealth: Payer: Self-pay

## 2024-03-07 NOTE — Telephone Encounter (Signed)
 FMLA for Patient forms received for completion for patient. Patient has been informed that process may take up to 5 business days.  Employer Name [employer]  Reason for being out: Migraines  Any inpatient care: N/A Any Planned appointment?  Patient is requesting start date of 53.2.25 Patient is requesting end date of 6.1.26  Pt is requesting 3x a month for up to 8hrs per episode  Verified with patient that it is ok to leave Voicemail updates on  Mobile [phone] (mobile)  Patient would like to pick up copy in our office when form is completed.   Fax number form should be sent to is [phone]  Forms placed in providers box for review

## 2024-03-08 DIAGNOSIS — Z0279 Encounter for issue of other medical certificate: Secondary | ICD-10-CM

## 2024-03-08 NOTE — Telephone Encounter (Signed)
 Completed forms received and faxed to [employer] [facility] at [phone]  Copy sent to scan  Copy left at the front desk for the patient. Pt notified via Mychart copy was available to pick up at her convenience or I could mail it to the address on file if preferred.

## 2024-03-08 NOTE — Telephone Encounter (Signed)
 Completed and forms given to [facility].

## 2024-03-12 ENCOUNTER — Other Ambulatory Visit: Payer: Self-pay | Admitting: Family Medicine

## 2024-03-27 ENCOUNTER — Other Ambulatory Visit: Payer: Self-pay

## 2024-03-27 DIAGNOSIS — G43709 Chronic migraine without aura, not intractable, without status migrainosus: Secondary | ICD-10-CM

## 2024-03-27 MED ORDER — EMGALITY 120 MG/ML ~~LOC~~ SOAJ
120.0000 mg | SUBCUTANEOUS | 4 refills | Status: AC
Start: 2024-03-27 — End: ?

## 2024-04-14 ENCOUNTER — Telehealth: Payer: Self-pay | Admitting: *Deleted

## 2024-04-14 DIAGNOSIS — R7303 Prediabetes: Secondary | ICD-10-CM

## 2024-04-14 DIAGNOSIS — I10 Essential (primary) hypertension: Secondary | ICD-10-CM

## 2024-04-14 NOTE — Telephone Encounter (Signed)
-----   Message from Harlene Du sent at 04/14/2024 10:00 AM EST ----- Regarding: Lab Tues 05/02/24 Hello,  Patient is coming in for CPE labs. Can we get orders please.   Thanks

## 2024-04-25 ENCOUNTER — Other Ambulatory Visit: Payer: 59

## 2024-05-02 ENCOUNTER — Encounter: Payer: Self-pay | Admitting: Family Medicine

## 2024-05-02 ENCOUNTER — Other Ambulatory Visit

## 2024-05-02 ENCOUNTER — Encounter: Payer: 59 | Admitting: Family Medicine

## 2024-05-09 ENCOUNTER — Encounter: Admitting: Family Medicine

## 2024-05-25 ENCOUNTER — Encounter: Admitting: Family Medicine

## 2024-06-02 ENCOUNTER — Other Ambulatory Visit: Payer: Self-pay | Admitting: Family Medicine

## 2024-06-02 NOTE — Telephone Encounter (Signed)
 Please schedule CPE with fasting labs prior for Dr. Ermalene Searing.

## 2024-06-11 ENCOUNTER — Other Ambulatory Visit: Payer: Self-pay | Admitting: Family Medicine

## 2024-10-31 ENCOUNTER — Telehealth: Admitting: Family Medicine
